# Patient Record
Sex: Female | Born: 1945
Health system: Southern US, Community
[De-identification: ages and names within clinical notes are randomized; demographics above are authoritative.]

## PROBLEM LIST (undated history)

## (undated) DIAGNOSIS — E119 Type 2 diabetes mellitus without complications: Secondary | ICD-10-CM

## (undated) DIAGNOSIS — G4730 Sleep apnea, unspecified: Secondary | ICD-10-CM

## (undated) DIAGNOSIS — F419 Anxiety disorder, unspecified: Secondary | ICD-10-CM

## (undated) DIAGNOSIS — M1990 Unspecified osteoarthritis, unspecified site: Secondary | ICD-10-CM

## (undated) DIAGNOSIS — I10 Essential (primary) hypertension: Secondary | ICD-10-CM

## (undated) HISTORY — DX: Anxiety disorder, unspecified: F41.9

## (undated) HISTORY — PX: JOINT REPLACEMENT: SHX530

## (undated) HISTORY — PX: CHOLECYSTECTOMY: SHX55

## (undated) HISTORY — DX: Type 2 diabetes mellitus without complications: E11.9

## (undated) HISTORY — PX: OTHER SURGICAL HISTORY: SHX169

## (undated) HISTORY — PX: BLADDER SUSPENSION: SHX72

## (undated) HISTORY — DX: Unspecified osteoarthritis, unspecified site: M19.90

---

## 2003-08-23 ENCOUNTER — Other Ambulatory Visit: Payer: Self-pay

## 2003-10-08 ENCOUNTER — Encounter: Payer: Self-pay | Admitting: Unknown Physician Specialty

## 2003-11-08 ENCOUNTER — Encounter: Payer: Self-pay | Admitting: Unknown Physician Specialty

## 2003-12-08 ENCOUNTER — Encounter: Payer: Self-pay | Admitting: Unknown Physician Specialty

## 2004-01-08 ENCOUNTER — Encounter: Payer: Self-pay | Admitting: Unknown Physician Specialty

## 2004-04-10 ENCOUNTER — Ambulatory Visit: Payer: Self-pay | Admitting: Internal Medicine

## 2004-04-18 ENCOUNTER — Ambulatory Visit: Payer: Self-pay | Admitting: Internal Medicine

## 2004-08-27 ENCOUNTER — Ambulatory Visit: Payer: Self-pay | Admitting: Unknown Physician Specialty

## 2004-08-27 ENCOUNTER — Other Ambulatory Visit: Payer: Self-pay

## 2004-08-30 ENCOUNTER — Ambulatory Visit: Payer: Self-pay | Admitting: Unknown Physician Specialty

## 2004-09-06 ENCOUNTER — Ambulatory Visit: Payer: Self-pay | Admitting: Unknown Physician Specialty

## 2005-06-12 ENCOUNTER — Ambulatory Visit: Payer: Self-pay | Admitting: Unknown Physician Specialty

## 2006-03-11 ENCOUNTER — Ambulatory Visit: Payer: Self-pay | Admitting: Otolaryngology

## 2006-07-29 ENCOUNTER — Ambulatory Visit: Payer: Self-pay | Admitting: Unknown Physician Specialty

## 2006-10-02 ENCOUNTER — Ambulatory Visit: Payer: Self-pay | Admitting: Internal Medicine

## 2007-07-03 ENCOUNTER — Ambulatory Visit: Payer: Self-pay | Admitting: Internal Medicine

## 2007-10-09 ENCOUNTER — Emergency Department: Payer: Self-pay | Admitting: Emergency Medicine

## 2008-01-08 HISTORY — PX: ROUX-EN-Y PROCEDURE: SUR1287

## 2009-01-31 ENCOUNTER — Ambulatory Visit: Payer: Self-pay | Admitting: Internal Medicine

## 2009-03-29 LAB — HM PAP SMEAR: HM Pap smear: NORMAL

## 2009-04-26 ENCOUNTER — Ambulatory Visit: Payer: Self-pay | Admitting: Unknown Physician Specialty

## 2009-05-03 ENCOUNTER — Ambulatory Visit: Payer: Self-pay | Admitting: Unknown Physician Specialty

## 2009-05-31 ENCOUNTER — Ambulatory Visit: Payer: Self-pay | Admitting: Internal Medicine

## 2009-06-08 ENCOUNTER — Ambulatory Visit: Payer: Self-pay | Admitting: Internal Medicine

## 2010-03-10 LAB — HM MAMMOGRAPHY

## 2010-08-27 ENCOUNTER — Ambulatory Visit: Payer: Self-pay | Admitting: Unknown Physician Specialty

## 2010-09-05 ENCOUNTER — Other Ambulatory Visit: Payer: Self-pay | Admitting: Internal Medicine

## 2010-09-05 MED ORDER — HYDROCODONE-ACETAMINOPHEN 7.5-750 MG PO TABS: 1.0000 | ORAL_TABLET | Freq: Four times a day (QID) | ORAL | Status: DC | PRN

## 2010-09-17 ENCOUNTER — Other Ambulatory Visit: Payer: Self-pay | Admitting: *Deleted

## 2010-09-17 NOTE — Telephone Encounter (Signed)
Received faxed refill request from pharmacy. Is it okay to refill Omeprazole?

## 2010-09-18 MED ORDER — OMEPRAZOLE 20 MG PO CPDR: 20.0000 mg | DELAYED_RELEASE_CAPSULE | Freq: Every day | ORAL | Status: DC

## 2010-09-21 ENCOUNTER — Other Ambulatory Visit: Payer: Self-pay | Admitting: Internal Medicine

## 2010-09-21 MED ORDER — ALPRAZOLAM 0.5 MG PO TABS: 0.5000 mg | ORAL_TABLET | Freq: Two times a day (BID) | ORAL | Status: DC | PRN

## 2010-09-26 ENCOUNTER — Other Ambulatory Visit: Payer: Self-pay | Admitting: Internal Medicine

## 2010-09-28 MED ORDER — TRAZODONE HCL 100 MG PO TABS: 100.0000 mg | ORAL_TABLET | Freq: Two times a day (BID) | ORAL | Status: DC

## 2010-10-12 ENCOUNTER — Other Ambulatory Visit: Payer: Self-pay | Admitting: Internal Medicine

## 2010-10-12 MED ORDER — IBUPROFEN 800 MG PO TABS: 800.0000 mg | ORAL_TABLET | Freq: Three times a day (TID) | ORAL | Status: AC | PRN

## 2010-10-12 MED ORDER — SERTRALINE HCL 100 MG PO TABS: 100.0000 mg | ORAL_TABLET | Freq: Every day | ORAL | Status: DC

## 2010-10-26 ENCOUNTER — Other Ambulatory Visit: Payer: Self-pay | Admitting: Internal Medicine

## 2010-10-27 MED ORDER — OMEPRAZOLE 20 MG PO CPDR: 20.0000 mg | DELAYED_RELEASE_CAPSULE | Freq: Every day | ORAL | Status: DC

## 2010-10-27 MED ORDER — TRAZODONE HCL 100 MG PO TABS: 100.0000 mg | ORAL_TABLET | Freq: Two times a day (BID) | ORAL | Status: DC

## 2010-11-16 ENCOUNTER — Other Ambulatory Visit (INDEPENDENT_AMBULATORY_CARE_PROVIDER_SITE_OTHER): Payer: Medicare Other | Admitting: *Deleted

## 2010-11-16 ENCOUNTER — Other Ambulatory Visit: Payer: Self-pay | Admitting: *Deleted

## 2010-11-16 DIAGNOSIS — N390 Urinary tract infection, site not specified: Secondary | ICD-10-CM

## 2010-11-16 LAB — POCT URINALYSIS DIPSTICK
Blood, UA: NEGATIVE
Glucose, UA: NEGATIVE
Nitrite, UA: NEGATIVE
pH, UA: 5.5

## 2010-11-16 MED ORDER — SULFAMETHOXAZOLE-TRIMETHOPRIM 800-160 MG PO TABS: 1.0000 | ORAL_TABLET | Freq: Two times a day (BID) | ORAL | Status: AC

## 2010-11-18 LAB — URINE CULTURE

## 2010-12-13 ENCOUNTER — Telehealth: Payer: Self-pay | Admitting: Internal Medicine

## 2010-12-13 MED ORDER — HYDROCODONE-ACETAMINOPHEN 7.5-750 MG PO TABS: 1.0000 | ORAL_TABLET | Freq: Four times a day (QID) | ORAL | Status: DC | PRN

## 2010-12-13 MED ORDER — TRAMADOL HCL 50 MG PO TABS: ORAL_TABLET | ORAL | Status: DC

## 2010-12-13 NOTE — Telephone Encounter (Signed)
Ok to refill the tramadol and vicodin by phone .

## 2010-12-18 NOTE — Telephone Encounter (Signed)
Rxs have been phoned in.

## 2011-01-07 ENCOUNTER — Other Ambulatory Visit: Payer: Self-pay | Admitting: *Deleted

## 2011-01-07 MED ORDER — HYDROCHLOROTHIAZIDE 25 MG PO TABS: 25.0000 mg | ORAL_TABLET | Freq: Every day | ORAL | Status: DC

## 2011-01-07 NOTE — Telephone Encounter (Signed)
Refill request for HCTZ from [facility].  This is not on med list, added for this refill.  No last filled date given.

## 2011-01-10 ENCOUNTER — Other Ambulatory Visit: Payer: Self-pay | Admitting: *Deleted

## 2011-01-10 MED ORDER — SOLIFENACIN SUCCINATE 5 MG PO TABS: 5.0000 mg | ORAL_TABLET | Freq: Every day | ORAL | Status: DC

## 2011-01-10 NOTE — Telephone Encounter (Signed)
Faxed request from [facility], last filled 12/12/10.

## 2011-01-14 ENCOUNTER — Other Ambulatory Visit: Payer: Self-pay | Admitting: *Deleted

## 2011-01-14 MED ORDER — SERTRALINE HCL 100 MG PO TABS: 100.0000 mg | ORAL_TABLET | Freq: Every day | ORAL | Status: DC

## 2011-01-14 NOTE — Telephone Encounter (Signed)
Faxed request from [facility], last filled 12/17/10.

## 2011-01-29 ENCOUNTER — Other Ambulatory Visit: Payer: Self-pay | Admitting: *Deleted

## 2011-01-30 MED ORDER — ALPRAZOLAM 0.5 MG PO TABS: 0.5000 mg | ORAL_TABLET | Freq: Two times a day (BID) | ORAL | Status: DC | PRN

## 2011-01-30 NOTE — Telephone Encounter (Signed)
Rx called to pharmacy

## 2011-01-30 NOTE — Telephone Encounter (Signed)
Refill on printer.  

## 2011-03-07 ENCOUNTER — Other Ambulatory Visit: Payer: Self-pay | Admitting: Internal Medicine

## 2011-03-08 MED ORDER — TRAZODONE HCL 100 MG PO TABS: 100.0000 mg | ORAL_TABLET | Freq: Two times a day (BID) | ORAL | Status: DC

## 2011-03-08 MED ORDER — ALPRAZOLAM 0.5 MG PO TABS: 0.5000 mg | ORAL_TABLET | Freq: Two times a day (BID) | ORAL | Status: DC | PRN

## 2011-03-08 MED ORDER — IBUPROFEN 800 MG PO TABS: 800.0000 mg | ORAL_TABLET | Freq: Three times a day (TID) | ORAL | Status: AC | PRN

## 2011-03-08 MED ORDER — TRAMADOL HCL 50 MG PO TABS: ORAL_TABLET | ORAL | Status: DC

## 2011-03-08 MED ORDER — SERTRALINE HCL 100 MG PO TABS: 100.0000 mg | ORAL_TABLET | Freq: Every day | ORAL | Status: DC

## 2011-03-29 ENCOUNTER — Other Ambulatory Visit: Payer: Self-pay | Admitting: Internal Medicine

## 2011-03-29 MED ORDER — ETODOLAC 500 MG PO TABS: 500.0000 mg | ORAL_TABLET | Freq: Two times a day (BID) | ORAL | Status: DC

## 2011-04-30 ENCOUNTER — Other Ambulatory Visit: Payer: Self-pay | Admitting: Internal Medicine

## 2011-05-01 MED ORDER — TRAZODONE HCL 100 MG PO TABS: 100.0000 mg | ORAL_TABLET | Freq: Two times a day (BID) | ORAL | Status: DC

## 2011-05-29 ENCOUNTER — Other Ambulatory Visit: Payer: Self-pay | Admitting: Internal Medicine

## 2011-05-29 MED ORDER — ALPRAZOLAM 0.5 MG PO TABS: 0.5000 mg | ORAL_TABLET | Freq: Two times a day (BID) | ORAL | Status: DC | PRN

## 2011-06-23 ENCOUNTER — Other Ambulatory Visit: Payer: Self-pay | Admitting: Internal Medicine

## 2011-06-26 ENCOUNTER — Other Ambulatory Visit: Payer: Self-pay | Admitting: Internal Medicine

## 2011-06-26 MED ORDER — SOLIFENACIN SUCCINATE 5 MG PO TABS: 5.0000 mg | ORAL_TABLET | Freq: Every day | ORAL | Status: DC

## 2011-06-26 MED ORDER — SERTRALINE HCL 100 MG PO TABS: 100.0000 mg | ORAL_TABLET | Freq: Every day | ORAL | Status: DC

## 2011-06-26 MED ORDER — TRAZODONE HCL 100 MG PO TABS: 100.0000 mg | ORAL_TABLET | Freq: Two times a day (BID) | ORAL | Status: DC

## 2011-06-26 MED ORDER — HYDROCHLOROTHIAZIDE 25 MG PO TABS: 25.0000 mg | ORAL_TABLET | Freq: Every day | ORAL | Status: DC

## 2011-06-26 NOTE — Telephone Encounter (Signed)
Patient has not been seen here and does not have an upcoming appt.  Please advise.

## 2011-07-01 ENCOUNTER — Other Ambulatory Visit: Payer: Self-pay | Admitting: *Deleted

## 2011-07-02 MED ORDER — HYDROCODONE-ACETAMINOPHEN 7.5-750 MG PO TABS: 1.0000 | ORAL_TABLET | Freq: Four times a day (QID) | ORAL | Status: DC | PRN

## 2011-08-27 ENCOUNTER — Other Ambulatory Visit: Payer: Self-pay | Admitting: Internal Medicine

## 2011-08-31 ENCOUNTER — Other Ambulatory Visit: Payer: Self-pay | Admitting: Internal Medicine

## 2011-09-23 ENCOUNTER — Other Ambulatory Visit: Payer: Self-pay | Admitting: Internal Medicine

## 2011-10-14 ENCOUNTER — Encounter: Payer: Self-pay | Admitting: Internal Medicine

## 2011-10-14 ENCOUNTER — Other Ambulatory Visit: Payer: Self-pay | Admitting: Internal Medicine

## 2011-10-15 ENCOUNTER — Other Ambulatory Visit: Payer: Self-pay

## 2011-10-15 DIAGNOSIS — Z79899 Other long term (current) drug therapy: Secondary | ICD-10-CM

## 2011-10-15 NOTE — Telephone Encounter (Signed)
Refill request for Etodolac 500 mg. Ok to refill?

## 2011-10-15 NOTE — Telephone Encounter (Signed)
No,  Patient has not been seen in over one year, actually has not seen me since before i changed practices. This medicatio ncan affect kidney function and I cannot refill it without knowing her kidney function.,  If she will make an appt for an OV and come in soon for fasting lipids,  hgba1c and CMEt, I can refill once I see her  kidney function I will refill until her visit.

## 2011-10-16 NOTE — Telephone Encounter (Signed)
Left message on patient vm instructing her as diredcted about her refill request. Advised patient to call office and schedule appt or come in for fasting labs.

## 2011-10-21 ENCOUNTER — Other Ambulatory Visit: Payer: Self-pay | Admitting: Internal Medicine

## 2011-10-21 NOTE — Telephone Encounter (Signed)
Pt is calling back [facility] [place].

## 2011-10-21 NOTE — Telephone Encounter (Signed)
Pt is needing refill on Alprazolam. Pt is not sure if the meds are cheaper at [facility] or [facility] she not sure what to go with .

## 2011-10-21 NOTE — Telephone Encounter (Signed)
Patient advised to check with the pharmacies and call back to let us know which pharmacy she would like medication called to.

## 2011-10-23 ENCOUNTER — Other Ambulatory Visit: Payer: Self-pay

## 2011-10-23 ENCOUNTER — Other Ambulatory Visit: Payer: Self-pay | Admitting: Internal Medicine

## 2011-10-23 MED ORDER — ALPRAZOLAM 0.5 MG PO TABS: 0.5000 mg | ORAL_TABLET | Freq: Two times a day (BID) | ORAL | Status: DC | PRN

## 2011-10-23 NOTE — Telephone Encounter (Signed)
Xanax 0.5 mg #60 3 R [facility]

## 2011-10-23 NOTE — Telephone Encounter (Signed)
Refill request for Xanax 0.5 mg. Sent to [facility].Ok to refill?

## 2011-10-24 ENCOUNTER — Other Ambulatory Visit: Payer: Self-pay

## 2011-10-24 MED ORDER — OMEPRAZOLE 20 MG PO CPDR: 20.0000 mg | DELAYED_RELEASE_CAPSULE | Freq: Every day | ORAL | Status: DC

## 2011-10-24 NOTE — Telephone Encounter (Signed)
R'cd fax from [facility] for refill of Omeprazole.  

## 2011-10-28 ENCOUNTER — Other Ambulatory Visit: Payer: Self-pay

## 2011-10-30 ENCOUNTER — Ambulatory Visit (INDEPENDENT_AMBULATORY_CARE_PROVIDER_SITE_OTHER): Payer: Medicare Other | Admitting: Internal Medicine

## 2011-10-30 ENCOUNTER — Encounter: Payer: Self-pay | Admitting: Internal Medicine

## 2011-10-30 VITALS — BP 126/80 | HR 68 | Temp 97.6°F | Wt 166.0 lb

## 2011-10-30 DIAGNOSIS — F1120 Opioid dependence, uncomplicated: Secondary | ICD-10-CM

## 2011-10-30 DIAGNOSIS — E1169 Type 2 diabetes mellitus with other specified complication: Secondary | ICD-10-CM

## 2011-10-30 DIAGNOSIS — Z8639 Personal history of other endocrine, nutritional and metabolic disease: Secondary | ICD-10-CM | POA: Insufficient documentation

## 2011-10-30 DIAGNOSIS — R32 Unspecified urinary incontinence: Secondary | ICD-10-CM

## 2011-10-30 DIAGNOSIS — M129 Arthropathy, unspecified: Secondary | ICD-10-CM

## 2011-10-30 DIAGNOSIS — Z9884 Bariatric surgery status: Secondary | ICD-10-CM | POA: Insufficient documentation

## 2011-10-30 DIAGNOSIS — M1990 Unspecified osteoarthritis, unspecified site: Secondary | ICD-10-CM

## 2011-10-30 DIAGNOSIS — E119 Type 2 diabetes mellitus without complications: Secondary | ICD-10-CM

## 2011-10-30 DIAGNOSIS — Z862 Personal history of diseases of the blood and blood-forming organs and certain disorders involving the immune mechanism: Secondary | ICD-10-CM

## 2011-10-30 DIAGNOSIS — Z79899 Other long term (current) drug therapy: Secondary | ICD-10-CM

## 2011-10-30 DIAGNOSIS — F419 Anxiety disorder, unspecified: Secondary | ICD-10-CM

## 2011-10-30 DIAGNOSIS — E669 Obesity, unspecified: Secondary | ICD-10-CM

## 2011-10-30 DIAGNOSIS — F1920 Other psychoactive substance dependence, uncomplicated: Secondary | ICD-10-CM

## 2011-10-30 DIAGNOSIS — F411 Generalized anxiety disorder: Secondary | ICD-10-CM

## 2011-10-30 LAB — COMPREHENSIVE METABOLIC PANEL
CO2: 29 mEq/L (ref 19–32)
Creatinine, Ser: 0.9 mg/dL (ref 0.4–1.2)
GFR: 65.94 mL/min (ref >60.00)
Glucose, Bld: 86 mg/dL (ref 70–99)
Sodium: 140 mEq/L (ref 135–145)
Total Bilirubin: 0.6 mg/dL (ref 0.3–1.2)
Total Protein: 5.9 g/dL — ABNORMAL LOW (ref 6.0–8.3)

## 2011-10-30 LAB — HEMOGLOBIN A1C: Hgb A1c MFr Bld: 5.2 % (ref 4.6–6.5)

## 2011-10-30 LAB — LIPID PANEL
Cholesterol: 175 mg/dL (ref 0–200)
HDL: 75.4 mg/dL (ref >39.00)
Triglycerides: 68 mg/dL (ref 0.0–149.0)

## 2011-10-30 MED ORDER — OXYBUTYNIN CHLORIDE 5 MG PO TABS: 5.0000 mg | ORAL_TABLET | Freq: Three times a day (TID) | ORAL | Status: DC

## 2011-10-30 MED ORDER — HYDROCODONE-ACETAMINOPHEN 7.5-750 MG PO TABS: 1.0000 | ORAL_TABLET | Freq: Four times a day (QID) | ORAL | Status: DC | PRN

## 2011-10-30 NOTE — Progress Notes (Signed)
Patient ID: Jodi [name], female   DOB: [birth year], 66 y.o.   MRN: [id number] Patient Active Problem List  Diagnosis  . History of gastric bypass  . Personal history of diabetes mellitus  . Diabetes mellitus without complication  . Arthritis  . Anxiety    Subjective:  CC:   Chief Complaint  Patient presents with  . Annual Exam    HPI:   JERRA [name] is a 66 y.o. female who presents as a new patient to establish primary care with the chief complaint of loss to follow up on chronic medical conditions. She has not been seen in over 15 months and her records are not available. She feels generally well except for chronic arthritis involving knees hips and more recently her right thumb. She has obtained and continued to lose weight since her gastric bypass surgery 3 years ago. Her weight has dropped from 365  pounds pre-surgery to 163 pounds currently.,  Bypass gastric was done April 2011 at [facility].  Has not followed with them in over a year bc she lost her Medicaid insurance ver a year ago. Her loss of insurance was not do to increased income was due to simply a failure to complete the application of time. Despite not working she is still a major application. She is surviving on her disability and insurance only. She manages her pain with Vicodin and tramadol.  Her last vicodin dose was this morning.  Using it four times daily and tramadol four times daily.  Sees Dr. Monica Martinez for hip and knee pain,     Past Medical History  Diagnosis Date  . Diabetes mellitus without complication     diet controlled since gastric bypass  . Arthritis   . Anxiety     Past Surgical History  Procedure Date  . Bladder suspension   . Cholecystectomy   . Tracheotomy     at age 89 due to respiratroy failure  . Joint replacement     left hip, right knee    Family History  Problem Relation Age of Onset  . COPD Mother   . Hypertension Mother   . Cancer Mother 71    ovarian CA    History   Social  History  . Marital Status: Widowed    Spouse Name: Jodi [name]    Number of Children: Jodi [name]  . Years of Education: Jodi [name]   Occupational History  . Not on file.   Social History Main Topics  . Smoking status: Never Smoker   . Smokeless tobacco: Not on file  . Alcohol Use: No  . Drug Use: No  . Sexually Active: Not on file   Other Topics Concern  . Not on file   Social History Narrative  . No narrative on file     Allergies  Allergen Reactions  . Morphine And Related Nausea And Vomiting    Review of Systems:   The remainder of the review of systems was negative except those addressed in the HPI.    Objective:  BP 126/80  Pulse 68  Temp 97.6 F (36.4 C)  Wt 166 lb (75.297 kg)  SpO2 96%  General appearance: alert, cooperative and appears stated age Ears: normal TM's and external ear canals both ears Throat: lips, mucosa, and tongue normal; teeth and gums normal Neck: no adenopathy, no carotid bruit, supple, symmetrical, trachea midline and thyroid not enlarged, symmetric, no tenderness/mass/nodules Back: symmetric, no curvature. ROM normal. No CVA tenderness. Lungs: clear to auscultation bilaterally Heart:  regular rate and rhythm, S1, S2 normal, no murmur, click, rub or gallop Abdomen: soft, non-tender; bowel sounds normal; no masses,  no organomegaly Pulses: 2+ and symmetric Skin: Skin color, texture, turgor normal. No rashes or lesions Lymph nodes: Cervical, supraclavicular, and axillary nodes normal.  Assessment and Plan:  Personal history of diabetes mellitus Resolved with dramatic wt loss achieved with gastric bypass.  Overdue for eye exam by [facility].   History of gastric bypass She has not had followup with her bariatric surgery in over a year due to loss of insurance. He will be tested today for vitamin D deficiency B12 deficiency andother a left jaw abnormalities.  Diabetes mellitus without complication Her diabetes has been diet controlled since her gastric  bypass 3 years ago. She has not had a hemoglobin A1c in over a year. She is also overdue for her annual eye exam.  Arthritis Chronic with multiple joints involved due to OA. Given her chronic use of Vicodin and lost to followup I required that she be seen today. She will be screened for diversion with the urine drug screen.  Anxiety Managed with sertraline and alprazolam. No changes today.   Updated Medication List Outpatient Encounter Prescriptions as of 10/30/2011  Medication Sig Dispense Refill  . hydrochlorothiazide (HYDRODIURIL) 25 MG tablet Take 1 tablet (25 mg total) by mouth daily.  90 tablet  3  . HYDROcodone-acetaminophen (VICODIN ES) 7.5-750 MG per tablet Take 1 tablet by mouth 4 (four) times daily as needed for pain.  120 tablet  0  . omeprazole (PRILOSEC) 20 MG capsule Take 1 capsule (20 mg total) by mouth daily.  30 capsule  6  . sertraline (ZOLOFT) 100 MG tablet Take 1 tablet (100 mg total) by mouth daily.  90 tablet  3  . solifenacin (VESICARE) 5 MG tablet Take 1 tablet (5 mg total) by mouth daily.  90 tablet  3  . traMADol (ULTRAM) 50 MG tablet TAKE 1 TABLET UP TO FOUR TIMES DAILY AS NEEDED  120 tablet  3  . DISCONTD: HYDROcodone-acetaminophen (VICODIN ES) 7.5-750 MG per tablet Take 1 tablet by mouth 4 (four) times daily as needed for pain.  120 tablet  2  . ALPRAZolam (XANAX) 0.5 MG tablet TAKE 1 TABLET BY MOUTH TWICE A DAY AS NEEDED  60 tablet  3  . etodolac (LODINE) 500 MG tablet TAKE 1 TABLET (500 MG TOTAL) BY MOUTH 2 (TWO) TIMES DAILY.  60 tablet  2  . oxybutynin (DITROPAN) 5 MG tablet Take 1 tablet (5 mg total) by mouth 3 (three) times daily.  60 tablet  3  . traMADol (ULTRAM) 50 MG tablet Take one tablet up to four times daily as needed.  120 tablet  3  . traZODone (DESYREL) 100 MG tablet Take 1 tablet (100 mg total) by mouth 2 (two) times daily.  60 tablet  3  . traZODone (DESYREL) 100 MG tablet Take 1 tablet (100 mg total) by mouth 2 (two) times daily.  60 tablet  3    . traZODone (DESYREL) 100 MG tablet Take 1 tablet (100 mg total) by mouth 2 (two) times daily.  180 tablet  3     Orders Placed This Encounter  Procedures  . HM MAMMOGRAPHY  . HM PAP SMEAR  . Hemoglobin A1c  . Lipid panel  . Microalbumin / creatinine urine ratio  . Comprehensive metabolic panel  . Vitamin D 25 hydroxy  . Vitamin B12  . Drug Screen, Urine    No  Follow-up on file.

## 2011-10-30 NOTE — Assessment & Plan Note (Signed)
Resolved with dramatic wt loss achieved with gastric bypass.  Overdue for eye exam by [facility].

## 2011-10-30 NOTE — Patient Instructions (Addendum)
Please return for your annual medicare wellness exam at your convenience

## 2011-10-31 LAB — DRUG SCREEN, URINE
Creatinine,U: 162.33 mg/dL
Opiates: POSITIVE — AB
Phencyclidine (PCP): NEGATIVE
Propoxyphene: NEGATIVE

## 2011-10-31 LAB — VITAMIN D 25 HYDROXY (VIT D DEFICIENCY, FRACTURES): Vit D, 25-Hydroxy: 31 ng/mL (ref 30–89)

## 2011-11-01 ENCOUNTER — Encounter: Payer: Self-pay | Admitting: Internal Medicine

## 2011-11-01 DIAGNOSIS — M1990 Unspecified osteoarthritis, unspecified site: Secondary | ICD-10-CM | POA: Insufficient documentation

## 2011-11-01 DIAGNOSIS — F419 Anxiety disorder, unspecified: Secondary | ICD-10-CM | POA: Insufficient documentation

## 2011-11-01 DIAGNOSIS — E119 Type 2 diabetes mellitus without complications: Secondary | ICD-10-CM | POA: Insufficient documentation

## 2011-11-01 NOTE — Assessment & Plan Note (Signed)
Chronic with multiple joints involved due to OA. Given her chronic use of Vicodin and lost to followup I required that she be seen today. She will be screened for diversion with the urine drug screen.

## 2011-11-01 NOTE — Assessment & Plan Note (Signed)
Her diabetes has been diet controlled since her gastric bypass 3 years ago. She has not had a hemoglobin A1c in over a year. She is also overdue for her annual eye exam.   

## 2011-11-01 NOTE — Assessment & Plan Note (Signed)
She has not had followup with her bariatric surgery in over a year due to loss of insurance. He will be tested today for vitamin D deficiency B12 deficiency andother a left jaw abnormalities.

## 2011-11-01 NOTE — Assessment & Plan Note (Signed)
Managed with sertraline and alprazolam.  No changes today 

## 2011-12-08 ENCOUNTER — Other Ambulatory Visit: Payer: Self-pay | Admitting: Internal Medicine

## 2011-12-12 ENCOUNTER — Telehealth: Payer: Self-pay | Admitting: Internal Medicine

## 2011-12-12 NOTE — Telephone Encounter (Signed)
Patient Information:  Caller Name: Jackalyn  Phone: [phone]  Patient: Jodi [name], Jodi [name]  Gender: Female  DOB: 09/23/1945  Age: 66 Years  PCP: Duncan Dull (Adults only)   Symptoms  Reason For Call & Symptoms: Is having increased headaches, and swollen hands  Reviewed Health History In EMR: Yes  Reviewed Medications In EMR: Yes  Reviewed Allergies In EMR: Yes  Reviewed Surgeries / Procedures: Yes  Date of Onset of Symptoms: 11/28/2011  Guideline(s) Used:  Headache  Disposition Per Guideline:   Home Care  Reason For Disposition Reached:   Similar to previously diagnosed migraine headaches  Advice Given:  Call Back If:  You become worse.  Office Follow Up:  Does the office need to follow up with this patient?: Yes  Instructions For The Office: Please follow up regarding question about taking medication 3 times a day as ordered but only had 60 dispensed. Also about if the increase in swelling and headaches could be related to the medications.  RN Note:  Is having oxybutynin is not working but seems to have an increase in headaches, puffiness, swollen;  Rates headache when then have as like having 1000s crickets in her head--noise that aggravates. It is a physical discomfort as well as a sound discomfort.  Has had ringing in ears for years.  Patient states is part of a chronic problem that seem to have been made worse related to the oxybutynin. Patient has a hx of migraines but uses distraction to work through headaches.  Patient wants clarification as to whether or not she should be taking Oxybutynin 3 times a day since she was only given enough for 2 times a day (60 versus 90)?  Also wants to know if the medication could be responsible for her symptoms that did not intensify until she did? Patient will call for an appointment to be seen sooner than scheduled physical in March related to headache.

## 2011-12-13 NOTE — Telephone Encounter (Signed)
Spoke to patient gave her instructions as directed, and advised patient to make appt for further questions.

## 2011-12-13 NOTE — Telephone Encounter (Signed)
Her  oxybutynin should be twice daily .   If she think it is aggravating her headaches she should stop it  For a week. For the resot of her questions,  Needs appt

## 2011-12-16 ENCOUNTER — Ambulatory Visit (INDEPENDENT_AMBULATORY_CARE_PROVIDER_SITE_OTHER)
Admission: RE | Admit: 2011-12-16 | Discharge: 2011-12-16 | Disposition: A | Discharge status: Home / Self Care | Payer: Medicare Other | Source: Ambulatory Visit | Attending: Internal Medicine | Admitting: Internal Medicine

## 2011-12-16 ENCOUNTER — Encounter: Payer: Self-pay | Admitting: Internal Medicine

## 2011-12-16 ENCOUNTER — Ambulatory Visit (INDEPENDENT_AMBULATORY_CARE_PROVIDER_SITE_OTHER): Payer: Medicare Other | Admitting: Internal Medicine

## 2011-12-16 VITALS — BP 138/70 | HR 54 | Temp 97.6°F | Resp 12 | Ht 65.0 in | Wt 168.5 lb

## 2011-12-16 DIAGNOSIS — R51 Headache: Secondary | ICD-10-CM

## 2011-12-16 DIAGNOSIS — Z862 Personal history of diseases of the blood and blood-forming organs and certain disorders involving the immune mechanism: Secondary | ICD-10-CM

## 2011-12-16 DIAGNOSIS — E119 Type 2 diabetes mellitus without complications: Secondary | ICD-10-CM

## 2011-12-16 DIAGNOSIS — Z1331 Encounter for screening for depression: Secondary | ICD-10-CM

## 2011-12-16 DIAGNOSIS — F1920 Other psychoactive substance dependence, uncomplicated: Secondary | ICD-10-CM

## 2011-12-16 DIAGNOSIS — R519 Headache, unspecified: Secondary | ICD-10-CM

## 2011-12-16 DIAGNOSIS — Z8639 Personal history of other endocrine, nutritional and metabolic disease: Secondary | ICD-10-CM

## 2011-12-16 DIAGNOSIS — F1120 Opioid dependence, uncomplicated: Secondary | ICD-10-CM

## 2011-12-16 LAB — CBC WITH DIFFERENTIAL/PLATELET
Basophils Relative: 0.6 % (ref 0.0–3.0)
Eosinophils Absolute: 0 10*3/uL (ref 0.0–0.7)
Eosinophils Relative: 0.5 % (ref 0.0–5.0)
HCT: 35.8 % — ABNORMAL LOW (ref 36.0–46.0)
Lymphocytes Relative: 27.6 % (ref 12.0–46.0)
MCV: 92.4 fl (ref 78.0–100.0)
Monocytes Relative: 6.3 % (ref 3.0–12.0)
Neutro Abs: 3.2 10*3/uL (ref 1.4–7.7)
Neutrophils Relative %: 65 % (ref 43.0–77.0)

## 2011-12-16 LAB — MICROALBUMIN / CREATININE URINE RATIO: Microalb, Ur: 0.2 mg/dL (ref 0.0–1.9)

## 2011-12-16 LAB — SEDIMENTATION RATE: Sed Rate: 5 mm/hr (ref 0–22)

## 2011-12-16 LAB — C-REACTIVE PROTEIN: CRP: <0.5 mg/dL (ref 0.5–20.0)

## 2011-12-16 NOTE — Progress Notes (Signed)
Patient ID: Jodi [name], female   DOB: [birth year], 66 y.o.   MRN: [id number]  Patient Active Problem List  Diagnosis  . History of gastric bypass  . Personal history of diabetes mellitus  . Diabetes mellitus without complication  . Arthritis  . Anxiety  . New onset of headaches after age 40    Subjective:  CC:   Chief Complaint  Patient presents with  . Headache    HPI:   Jodi [name] a 66 y.o. female who presents fof evaluation of new onset headaches.  She has a history of chronic tinnitus  for years and years,  Which is aggravated by headaches. The headaches are occurring daily in the morning and increased in frequency when she started taking  Oxybutynin for urinary incontinence.  They are rRelieved only with medication (tylenol)  . Takes tramadol and etodolac regularly for arthritis pain and .  vicodin twice daily for pain not relieved with non narcotics.  The headaches vary and omeitmes feels like a pin stabbing in the eye, sometimes feel pulsatile ("my veins are swelling in my head) . Feels tender in the temples but has  Not noted any engorged blood veins in her temples or forehead, only on the scalp.  She occasionally has had blurring of vision with resolution when headaches resolve.  Some dizziness with sudden position change.  No eye exam in the last year.  She has OA of the neck which causes chronic  neck pain and hears/feels grinding when she turns her head from side to side.   Past Medical History  Diagnosis Date  . Diabetes mellitus without complication     diet controlled since gastric bypass  . Arthritis   . Anxiety     Past Surgical History  Procedure Date  . Bladder suspension   . Cholecystectomy   . Tracheotomy     at age 54 due to respiratroy failure  . Joint replacement     left hip, right knee  . Roux-en-y procedure 2010    The following portions of the patient's history were reviewed and updated as appropriate: Allergies, current medications, and  problem list.    Review of Systems:   Patient denies, fevers, malaise, unintentional weight loss, skin rash, eye pain, sinus congestion and sinus pain, sore throat, dysphagia,  hemoptysis , cough, dyspnea, wheezing, chest pain, palpitations, orthopnea, edema, abdominal pain, nausea, melena, diarrhea, constipation, flank pain, dysuria, hematuria, urinary  Frequency, nocturia, numbness, tingling, seizures,  Focal weakness, Loss of consciousness,  Tremor, insomnia, depression, anxiety, and suicidal ideation.       History   Social History  . Marital Status: Widowed    Spouse Name: N/A    Number of Children: N/A  . Years of Education: N/A   Occupational History  . Not on file.   Social History Main Topics  . Smoking status: Never Smoker   . Smokeless tobacco: Not on file  . Alcohol Use: No  . Drug Use: No  . Sexually Active: Not on file   Other Topics Concern  . Not on file   Social History Narrative  . No narrative on file    Objective:  BP 138/70  Pulse 54  Temp 97.6 F (36.4 C) (Oral)  Resp 12  Ht 5\' 5"  (1.651 m)  Wt 168 lb 8 oz (76.431 kg)  BMI 28.04 kg/m2  SpO2 98%  General appearance: alert, cooperative and appears stated age Ears: normal TM's and external ear canals both ears  Throat: lips, mucosa, and tongue normal; teeth and gums normal Neck: no adenopathy, no carotid bruit, supple, symmetrical, trachea midline and thyroid not enlarged, symmetric, no tenderness/mass/nodules Back: symmetric, no curvature. ROM normal. No CVA tenderness. Lungs: clear to auscultation bilaterally Heart: regular rate and rhythm, S1, S2 normal, no murmur, click, rub or gallop Abdomen: soft, non-tender; bowel sounds normal; no masses,  no organomegaly Pulses: 2+ and symmetric Skin: Skin color, texture, turgor normal. No rashes or lesions Lymph nodes: Cervical, supraclavicular, and axillary nodes normal. Neuro: grossly nonfocal.  No CN deficit, DTRs normal, no nystagmys, negative  pronator drift  Assessment and Plan:  New onset of headaches after age 86 Now daily for several weeks,  With OA neck likely contributing. Consider vasculitis , cervical myelopathy and medication rebound,  Stop oxybutynin for a trial,  Plain films of neck, UDS for chronci use of vicodin to rule out diversion.  Will consider trial of elavil  Personal history of diabetes mellitus Reminded to have her annual diabetic eye exam even though she has resolved historyr of diabetes with weight loss   Updated Medication List Outpatient Encounter Prescriptions as of 12/16/2011  Medication Sig Dispense Refill  . ALPRAZolam (XANAX) 0.5 MG tablet TAKE 1 TABLET BY MOUTH TWICE A DAY AS NEEDED  60 tablet  3  . etodolac (LODINE) 500 MG tablet TAKE 1 TABLET (500 MG TOTAL) BY MOUTH 2 (TWO) TIMES DAILY.  60 tablet  11  . hydrochlorothiazide (HYDRODIURIL) 25 MG tablet Take 1 tablet (25 mg total) by mouth daily.  90 tablet  3  . HYDROcodone-acetaminophen (VICODIN ES) 7.5-750 MG per tablet Take 1 tablet by mouth 4 (four) times daily as needed for pain.  120 tablet  0  . omeprazole (PRILOSEC) 20 MG capsule Take 1 capsule (20 mg total) by mouth daily.  30 capsule  6  . oxybutynin (DITROPAN) 5 MG tablet Take 1 tablet (5 mg total) by mouth 3 (three) times daily.  60 tablet  3  . sertraline (ZOLOFT) 100 MG tablet Take 1 tablet (100 mg total) by mouth daily.  90 tablet  3  . traMADol (ULTRAM) 50 MG tablet Take one tablet up to four times daily as needed.  120 tablet  3  . traMADol (ULTRAM) 50 MG tablet TAKE 1 TABLET UP TO FOUR TIMES DAILY AS NEEDED  120 tablet  3  . traZODone (DESYREL) 100 MG tablet Take 1 tablet (100 mg total) by mouth 2 (two) times daily.  60 tablet  3  . solifenacin (VESICARE) 5 MG tablet Take 1 tablet (5 mg total) by mouth daily.  90 tablet  3  . traZODone (DESYREL) 100 MG tablet Take 1 tablet (100 mg total) by mouth 2 (two) times daily.  60 tablet  3  . traZODone (DESYREL) 100 MG tablet Take 1 tablet  (100 mg total) by mouth 2 (two) times daily.  180 tablet  3     Orders Placed This Encounter  Procedures  . DG Cervical Spine Complete  . Drug Screen, Urine  . Microalbumin / creatinine urine ratio  . Sedimentation rate  . CBC with Differential  . C-reactive protein    No Follow-up on file.

## 2011-12-16 NOTE — Patient Instructions (Addendum)
Stop the oxybutynin  For two weeks  To see if the headaches improve.  Plain x rays  Of your neck to be done at  The [place] office on [address] at [address] .  It is on the left side as you drive towards [place],  [place].   Labs today

## 2011-12-17 ENCOUNTER — Encounter: Payer: Self-pay | Admitting: Internal Medicine

## 2011-12-17 DIAGNOSIS — R519 Headache, unspecified: Secondary | ICD-10-CM | POA: Insufficient documentation

## 2011-12-17 LAB — DRUG SCREEN, URINE
Barbiturate Quant, Ur: NEGATIVE
Cocaine Metabolites: NEGATIVE
Creatinine,U: 151.76 mg/dL
Methadone: NEGATIVE
Propoxyphene: NEGATIVE

## 2011-12-17 NOTE — Assessment & Plan Note (Signed)
Reminded to have her annual diabetic eye exam even though she has resolved historyr of diabetes with weight loss

## 2011-12-17 NOTE — Assessment & Plan Note (Signed)
Now daily for several weeks,  With OA neck likely contributing. Consider vasculitis , cervical myelopathy and medication rebound,  Stop oxybutynin for a trial,  Plain films of neck, UDS for chronci use of vicodin to rule out diversion.  Will consider trial of elavil

## 2011-12-28 ENCOUNTER — Other Ambulatory Visit: Payer: Self-pay | Admitting: Internal Medicine

## 2012-01-07 ENCOUNTER — Telehealth: Payer: Self-pay | Admitting: Internal Medicine

## 2012-01-07 MED ORDER — HYDROCODONE-ACETAMINOPHEN 10-325 MG PO TABS: 1.0000 | ORAL_TABLET | Freq: Three times a day (TID) | ORAL | Status: DC | PRN

## 2012-01-07 MED ORDER — TRAMADOL HCL 50 MG PO TABS: 100.0000 mg | ORAL_TABLET | Freq: Three times a day (TID) | ORAL | Status: DC | PRN

## 2012-01-07 MED ORDER — AMITRIPTYLINE HCL 25 MG PO TABS: 25.0000 mg | ORAL_TABLET | Freq: Every day | ORAL | Status: DC

## 2012-01-07 NOTE — Telephone Encounter (Signed)
Patient left voicemail on 01/06/12 at 2:43 stating they no longer were carrying her hydrocodone 7.5/750 she states that she has tried the 7.35 however it didn't touch her pain. She needs something called into pharmacy that will equal to the hydrocodone 7.5/750.

## 2012-01-07 NOTE — Telephone Encounter (Signed)
I called and spoke with patient she states that the hydrocodone is higher in price now and that she doesn't get as many pills.  She states that she was taking the hydrocodone for knees and hips, she states that she isn't having as many headaches now, but she will still take the medication as needed.  She wants to know if tramadol comes in higher dose than 50 mg since she takes that 4 times a day.  She states that isn't strong enough to take away the pain.  She is about to run out of hydrocodone.

## 2012-01-07 NOTE — Telephone Encounter (Signed)
I can change the vicodin to 10/325 , and new rx can be called in for #120/month. If her pain is that severe that 4 vicodin per day and 4 tramadol per day is not managing her pain she can increase her tramadol to 6 daily but  I will not prescribe anything stronger .  .after that she will need to see her orthopedist  for other options.     Please also remind her that we diagnosed b12 deficiency at last visit and she has not been in for any b12 injections as far as I can tell.  B12 deficiency results from gastric bypass surgery and can cause a permanent neuropathy so she needs to take this seriously

## 2012-01-07 NOTE — Telephone Encounter (Signed)
See answer to note below that was misrouted. Please convey to patient

## 2012-01-07 NOTE — Telephone Encounter (Signed)
There is nothing stronger tha I will call in.  The only component that has changed  In the the hydrocodone is the amount of tylenol in it, not the narcotic.  She will have to make do with what she has until she can be seen.  If her headaches are still bothering her she will need to start elavil at bedtime   I will call that rx in.

## 2012-01-14 NOTE — Telephone Encounter (Signed)
Pt.notified

## 2012-01-28 ENCOUNTER — Encounter: Payer: Self-pay | Admitting: Adult Health

## 2012-01-28 ENCOUNTER — Ambulatory Visit (INDEPENDENT_AMBULATORY_CARE_PROVIDER_SITE_OTHER): Payer: Medicare Other | Admitting: Adult Health

## 2012-01-28 VITALS — BP 122/66 | HR 70 | Temp 97.4°F | Resp 18 | Ht 66.0 in | Wt 162.0 lb

## 2012-01-28 DIAGNOSIS — E538 Deficiency of other specified B group vitamins: Secondary | ICD-10-CM

## 2012-01-28 DIAGNOSIS — R059 Cough, unspecified: Secondary | ICD-10-CM

## 2012-01-28 DIAGNOSIS — R05 Cough: Secondary | ICD-10-CM | POA: Insufficient documentation

## 2012-01-28 DIAGNOSIS — R058 Other specified cough: Secondary | ICD-10-CM | POA: Insufficient documentation

## 2012-01-28 MED ORDER — AMOXICILLIN-POT CLAVULANATE 875-125 MG PO TABS: 1.0000 | ORAL_TABLET | Freq: Two times a day (BID) | ORAL | Status: DC

## 2012-01-28 MED ORDER — CYANOCOBALAMIN 1000 MCG/ML IJ SOLN
1000.0000 ug | Freq: Once | INTRAMUSCULAR | Status: AC
  Administered 2012-01-28: 1000 ug via INTRAMUSCULAR

## 2012-01-28 NOTE — Progress Notes (Signed)
Subjective:    Patient ID: Jodi [name], female    DOB: 05-18-1945, 67 y.o.   MRN: [id number]  HPI  Patient presents to clinic with 2-3 week hx of productive cough of green phlegm. Denies fever although she has had some chills. She has tried some OTC coricidin which she felt had improved her symptoms slightly but they did not completely resolve.   Current Outpatient Prescriptions on File Prior to Visit  Medication Sig Dispense Refill  . ALPRAZolam (XANAX) 0.5 MG tablet TAKE 1 TABLET BY MOUTH TWICE A DAY AS NEEDED  60 tablet  3  . amitriptyline (ELAVIL) 25 MG tablet Take 1 tablet (25 mg total) by mouth at bedtime. May increase to 2 if needed after one week  60 tablet  2  . etodolac (LODINE) 500 MG tablet TAKE 1 TABLET (500 MG TOTAL) BY MOUTH 2 (TWO) TIMES DAILY.  60 tablet  11  . hydrochlorothiazide (HYDRODIURIL) 25 MG tablet Take 1 tablet (25 mg total) by mouth daily.  90 tablet  3  . HYDROcodone-acetaminophen (NORCO) 10-325 MG per tablet Take 1 tablet by mouth every 8 (eight) hours as needed for pain.  120 tablet  3  . omeprazole (PRILOSEC) 20 MG capsule Take 1 capsule (20 mg total) by mouth daily.  30 capsule  6  . sertraline (ZOLOFT) 100 MG tablet Take 1 tablet (100 mg total) by mouth daily.  90 tablet  3  . traMADol (ULTRAM) 50 MG tablet Take 2 tablets (100 mg total) by mouth every 8 (eight) hours as needed for pain.  180 tablet  3  . oxybutynin (DITROPAN) 5 MG tablet Take 1 tablet (5 mg total) by mouth 3 (three) times daily.  60 tablet  3  . solifenacin (VESICARE) 5 MG tablet Take 1 tablet (5 mg total) by mouth daily.  90 tablet  3  . traMADol (ULTRAM) 50 MG tablet Take one tablet up to four times daily as needed.  120 tablet  3  . traZODone (DESYREL) 100 MG tablet Take 1 tablet (100 mg total) by mouth 2 (two) times daily.  60 tablet  3  . traZODone (DESYREL) 100 MG tablet Take 1 tablet (100 mg total) by mouth 2 (two) times daily.  60 tablet  3  . traZODone (DESYREL) 100 MG tablet  Take 1 tablet (100 mg total) by mouth 2 (two) times daily.  180 tablet  3     Review of Systems  Constitutional: Positive for chills, activity change and fatigue. Negative for fever.  HENT: Positive for postnasal drip. Negative for congestion and sore throat.   Eyes: Negative.   Respiratory: Positive for cough. Negative for wheezing.   Cardiovascular: Negative.  Negative for chest pain.  Gastrointestinal: Negative.   Genitourinary: Negative.   Skin: Negative.   Neurological: Positive for weakness.    BP 122/66  Pulse 70  Temp 97.4 F (36.3 C) (Oral)  Resp 18  Ht 5\' 6"  (1.676 m)  Wt 162 lb (73.483 kg)  BMI 26.15 kg/m2  SpO2 96%     Objective:   Physical Exam  Constitutional: She is oriented to person, place, and time. She appears well-developed and well-nourished. No distress.  HENT:  Head: Normocephalic and atraumatic.  Cardiovascular: Normal rate.  Exam reveals no gallop.   No murmur heard.      Irregular rhythm  Pulmonary/Chest: Effort normal and breath sounds normal. She has no wheezes. She has no rales.       Good air  movement throughout all lung fields.  Abdominal: Soft. Bowel sounds are normal.  Lymphadenopathy:    She has no cervical adenopathy.  Neurological: She is alert and oriented to person, place, and time.  Skin: Skin is warm and dry.  Psychiatric: She has a normal mood and affect. Her behavior is normal. Thought content normal.        Assessment & Plan:

## 2012-01-28 NOTE — Assessment & Plan Note (Addendum)
Symptoms ongoing > 2 weeks. Patient did not seek medical attention sooner because she felt she was improving. Her lungs are clear to auscultation with good air movement. Will start Augmentin 2/2 report of green sputum. Patient will continue taking OTC cough medicine. She is to RTC if symptoms do not improve within 3-4 days.

## 2012-01-28 NOTE — Patient Instructions (Addendum)
  Start the antibiotic today. You will take this twice daily for 7 days.  Take a cough medicine that has DM in the name.  Please drink fluids to stay hydrated.  Your symptoms should improve within 3-4 days. If not, please call.

## 2012-01-30 ENCOUNTER — Ambulatory Visit (INDEPENDENT_AMBULATORY_CARE_PROVIDER_SITE_OTHER): Payer: Medicare Other | Admitting: *Deleted

## 2012-01-30 DIAGNOSIS — E538 Deficiency of other specified B group vitamins: Secondary | ICD-10-CM

## 2012-01-30 MED ORDER — CYANOCOBALAMIN 1000 MCG/ML IJ SOLN
1000.0000 ug | Freq: Once | INTRAMUSCULAR | Status: AC
  Administered 2012-01-30: 1000 ug via INTRAMUSCULAR

## 2012-01-31 ENCOUNTER — Ambulatory Visit (INDEPENDENT_AMBULATORY_CARE_PROVIDER_SITE_OTHER): Payer: Medicare Other | Admitting: *Deleted

## 2012-01-31 DIAGNOSIS — E538 Deficiency of other specified B group vitamins: Secondary | ICD-10-CM

## 2012-01-31 MED ORDER — CYANOCOBALAMIN 1000 MCG/ML IJ SOLN
1000.0000 ug | Freq: Once | INTRAMUSCULAR | Status: AC
  Administered 2012-01-31: 1000 ug via INTRAMUSCULAR

## 2012-02-16 ENCOUNTER — Other Ambulatory Visit: Payer: Self-pay | Admitting: Internal Medicine

## 2012-02-24 ENCOUNTER — Other Ambulatory Visit: Payer: Self-pay | Admitting: Internal Medicine

## 2012-02-24 NOTE — Telephone Encounter (Signed)
Med filled already

## 2012-03-03 ENCOUNTER — Ambulatory Visit (INDEPENDENT_AMBULATORY_CARE_PROVIDER_SITE_OTHER): Payer: Medicare Other | Admitting: *Deleted

## 2012-03-03 ENCOUNTER — Ambulatory Visit: Payer: Medicare Other

## 2012-03-03 DIAGNOSIS — E538 Deficiency of other specified B group vitamins: Secondary | ICD-10-CM

## 2012-03-03 MED ORDER — CYANOCOBALAMIN 1000 MCG/ML IJ SOLN
1000.0000 ug | Freq: Once | INTRAMUSCULAR | Status: AC
  Administered 2012-03-03: 1000 ug via INTRAMUSCULAR

## 2012-03-09 ENCOUNTER — Ambulatory Visit (INDEPENDENT_AMBULATORY_CARE_PROVIDER_SITE_OTHER): Payer: Medicare Other | Admitting: Internal Medicine

## 2012-03-09 ENCOUNTER — Other Ambulatory Visit (HOSPITAL_COMMUNITY)
Admission: RE | Admit: 2012-03-09 | Discharge: 2012-03-09 | Disposition: A | Discharge status: Home / Self Care | Payer: Medicare Other | Source: Ambulatory Visit | Attending: Internal Medicine | Admitting: Internal Medicine

## 2012-03-09 ENCOUNTER — Encounter: Payer: Self-pay | Admitting: Internal Medicine

## 2012-03-09 VITALS — BP 128/78 | HR 76 | Temp 97.8°F | Resp 12 | Ht 65.25 in | Wt 164.0 lb

## 2012-03-09 DIAGNOSIS — Z0000 Encounter for general adult medical examination without abnormal findings: Secondary | ICD-10-CM | POA: Insufficient documentation

## 2012-03-09 DIAGNOSIS — Z1239 Encounter for other screening for malignant neoplasm of breast: Secondary | ICD-10-CM

## 2012-03-09 DIAGNOSIS — Z79899 Other long term (current) drug therapy: Secondary | ICD-10-CM

## 2012-03-09 DIAGNOSIS — Z1151 Encounter for screening for human papillomavirus (HPV): Secondary | ICD-10-CM | POA: Insufficient documentation

## 2012-03-09 DIAGNOSIS — Z1211 Encounter for screening for malignant neoplasm of colon: Secondary | ICD-10-CM

## 2012-03-09 DIAGNOSIS — E119 Type 2 diabetes mellitus without complications: Secondary | ICD-10-CM

## 2012-03-09 DIAGNOSIS — Z124 Encounter for screening for malignant neoplasm of cervix: Secondary | ICD-10-CM

## 2012-03-09 LAB — COMPREHENSIVE METABOLIC PANEL
ALT: 16 U/L (ref 0–35)
AST: 17 U/L (ref 0–37)
CO2: 29 mEq/L (ref 19–32)
Calcium: 8.9 mg/dL (ref 8.4–10.5)
Chloride: 103 mEq/L (ref 96–112)
Creatinine, Ser: 0.8 mg/dL (ref 0.4–1.2)
GFR: 74.28 mL/min (ref >60.00)
Sodium: 139 mEq/L (ref 135–145)
Total Bilirubin: 0.9 mg/dL (ref 0.3–1.2)
Total Protein: 6 g/dL (ref 6.0–8.3)

## 2012-03-09 LAB — LIPID PANEL
HDL: 72.1 mg/dL (ref >39.00)
Total CHOL/HDL Ratio: 2
VLDL: 13.2 mg/dL (ref 0.0–40.0)

## 2012-03-09 LAB — HEMOGLOBIN A1C: Hgb A1c MFr Bld: 5.2 % (ref 4.6–6.5)

## 2012-03-09 NOTE — Patient Instructions (Addendum)
We will notify  You the results of your labs drawn today, inclduign your EKG.  You are continuining to lose weight!  Your mammogram will be scheduled.

## 2012-03-09 NOTE — Progress Notes (Signed)
Patient ID: Jodi [name], female   DOB: 1945-04-03, 67 y.o.   MRN: [id number]  The patient is here for her first  Medicare wellness examination   The risk factors are reflected in the social history.  The roster of all physicians providing medical care to patient - is listed in the Snapshot section of the chart.  Activities of daily living:  The patient is 100% independent in all ADLs: dressing, toileting, feeding as well as independent mobility  Home safety : The patient has smoke detectors in the home. They wear seatbelts.  There are no firearms at home. There is no violence in the home.   There is no risks for hepatitis, STDs or HIV. There is no   history of blood transfusion. They have no travel history to infectious disease endemic areas of the world.  The patient has seen their dentist in the last six month. They have seen their eye doctor in the last year. They admit to slight hearing difficulty with regard to whispered voices and some television programs.  They have deferred audiologic testing in the last year.  They do not  have excessive sun exposure. Discussed the need for sun protection: hats, long sleeves and use of sunscreen if there is significant sun exposure.   Diet: the importance of a healthy diet is discussed. They do have a healthy diet.   The benefits of regular aerobic exercise were discussed. However due to bilateral hip and knee pain she does not exercise.    Depression screen: there are no signs or vegative symptoms of depression- irritability, change in appetite, anhedonia, sadness/tearfullness.  Cognitive assessment: the patient manages all their financial and personal affairs and is actively engaged. They could relate day,date,year and events; recalled 2/3 objects at 3 minutes; performed clock-face test normally.  The following portions of the patient's history were reviewed and updated as appropriate: allergies, current medications, past family history, past  medical history,  past surgical history, past social history  and problem list.  Visual acuity was not assessed per patient preference since she has regular follow up with her ophthalmologist. Hearing and body mass index were assessed and reviewed.   During the course of the visit the patient was educated and counseled about appropriate screening and preventive services including : fall prevention , diabetes screening, nutrition counseling, colorectal cancer screening, and recommended immunizations.    Objective:  BP 128/78  Pulse 76  Temp(Src) 97.8 F (36.6 C) (Oral)  Resp 12  Ht 5' 5.25" (1.657 m)  Wt 164 lb (74.39 kg)  BMI 27.09 kg/m2  SpO2 97%  General Appearance:    Alert, cooperative, no distress, appears stated age  Head:    Normocephalic, without obvious abnormality, atraumatic  Eyes:    PERRL, conjunctiva/corneas clear, EOM's intact, fundi    benign, both eyes  Ears:    Normal TM's and external ear canals, both ears  Nose:   Nares normal, septum midline, mucosa normal, no drainage    or sinus tenderness  Throat:   Lips, mucosa, and tongue normal; teeth and gums normal  Neck:   Supple, symmetrical, trachea midline, no adenopathy;    thyroid:  no enlargement/tenderness/nodules; no carotid   bruit or JVD  Back:     Symmetric, no curvature, ROM normal, no CVA tenderness  Lungs:     Clear to auscultation bilaterally, respirations unlabored  Chest Wall:    No tenderness or deformity   Heart:    Regular rate and rhythm,  S1 and S2 normal, no murmur, rub   or gallop  Breast Exam:    No tenderness, masses, or nipple abnormality  Abdomen:     Soft, non-tender, bowel sounds active all four quadrants,    no masses, no organomegaly  Genitalia:    Pelvic: cervix normal in appearance, external genitalia normal, no adnexal masses or tenderness, no cervical motion tenderness, rectovaginal septum normal, uterus normal size, shape, and consistency and vagina normal without discharge   Rectal: FOBT negative, no hemorrhoids or masses  Extremities:   Extremities normal, atraumatic, no cyanosis or edema  Pulses:   2+ and symmetric all extremities  Skin:   Skin color, texture, turgor normal, no rashes or lesions  Lymph nodes:   Cervical, supraclavicular, and axillary nodes normal  Neurologic:   CNII-XII intact, normal strength, sensation and reflexes    throughout    Assessment and Plan  Routine general medical examination at a health care facility Annual comprehensive exam was done including breast, pelvic and PAP smear. All screenings have been addressed . Baseline EKG shows normal sinus rhythm with a variable rate.   Updated Medication List Outpatient Encounter Prescriptions as of 03/09/2012  Medication Sig Dispense Refill  . ALPRAZolam (XANAX) 0.5 MG tablet TAKE 1 TABLET BY MOUTH TWICE A DAY AS NEEDED  60 tablet  3  . amitriptyline (ELAVIL) 25 MG tablet Take 1 tablet (25 mg total) by mouth at bedtime. May increase to 2 if needed after one week  60 tablet  2  . etodolac (LODINE) 500 MG tablet TAKE 1 TABLET (500 MG TOTAL) BY MOUTH 2 (TWO) TIMES DAILY.  60 tablet  11  . hydrochlorothiazide (HYDRODIURIL) 25 MG tablet Take 1 tablet (25 mg total) by mouth daily.  90 tablet  3  . HYDROcodone-acetaminophen (NORCO) 10-325 MG per tablet Take 1 tablet by mouth every 8 (eight) hours as needed for pain.  120 tablet  3  . omeprazole (PRILOSEC) 20 MG capsule Take 1 capsule (20 mg total) by mouth daily.  30 capsule  6  . sertraline (ZOLOFT) 100 MG tablet Take 1 tablet (100 mg total) by mouth daily.  90 tablet  3  . traMADol (ULTRAM) 50 MG tablet Take 2 tablets (100 mg total) by mouth every 8 (eight) hours as needed for pain.  180 tablet  3  . traMADol (ULTRAM) 50 MG tablet Take 2 tablet by mouth every 8 hours as needed      . traZODone (DESYREL) 100 MG tablet Take 100 mg by mouth 2 (two) times daily.      Marland Kitchen oxybutynin (DITROPAN) 5 MG tablet Take 1 tablet (5 mg total) by mouth 3 (three)  times daily.  60 tablet  3  . solifenacin (VESICARE) 5 MG tablet Take 1 tablet (5 mg total) by mouth daily.  90 tablet  3  . traMADol (ULTRAM) 50 MG tablet Take one tablet up to four times daily as needed.  120 tablet  3  . traZODone (DESYREL) 100 MG tablet Take 1 tablet (100 mg total) by mouth 2 (two) times daily.  60 tablet  3  . traZODone (DESYREL) 100 MG tablet Take 1 tablet (100 mg total) by mouth 2 (two) times daily.  60 tablet  3  . traZODone (DESYREL) 100 MG tablet Take 1 tablet (100 mg total) by mouth 2 (two) times daily.  180 tablet  3  . [DISCONTINUED] amoxicillin-clavulanate (AUGMENTIN) 875-125 MG per tablet Take 1 tablet by mouth 2 (two) times daily.  14 tablet  0   No facility-administered encounter medications on file as of 03/09/2012.

## 2012-03-09 NOTE — Assessment & Plan Note (Signed)
Annual comprehensive exam was done including breast, pelvic and PAP smear. All screenings have been addressed .  

## 2012-03-19 ENCOUNTER — Other Ambulatory Visit: Payer: Self-pay | Admitting: Internal Medicine

## 2012-03-19 NOTE — Telephone Encounter (Signed)
Med filled.  

## 2012-04-09 ENCOUNTER — Other Ambulatory Visit: Payer: Self-pay | Admitting: Internal Medicine

## 2012-04-22 LAB — HM PAP SMEAR: HM Pap smear: NORMAL

## 2012-05-14 ENCOUNTER — Other Ambulatory Visit: Payer: Self-pay | Admitting: Internal Medicine

## 2012-05-14 NOTE — Telephone Encounter (Signed)
Rx sent to pharmacy by escript  

## 2012-05-30 ENCOUNTER — Other Ambulatory Visit: Payer: Self-pay | Admitting: Internal Medicine

## 2012-06-20 ENCOUNTER — Other Ambulatory Visit: Payer: Self-pay | Admitting: Internal Medicine

## 2012-06-22 NOTE — Telephone Encounter (Signed)
Please Advise

## 2012-06-22 NOTE — Telephone Encounter (Signed)
Ok to refill,  Authorized in epic 

## 2012-06-22 NOTE — Telephone Encounter (Signed)
Rx faxed to pharmacy  

## 2012-07-14 ENCOUNTER — Other Ambulatory Visit: Payer: Self-pay | Admitting: Internal Medicine

## 2012-07-14 NOTE — Telephone Encounter (Signed)
Last OV 3/14, ok refill?

## 2012-07-15 NOTE — Telephone Encounter (Signed)
Rx faxed to pharmacy  

## 2012-07-17 ENCOUNTER — Other Ambulatory Visit: Payer: Self-pay | Admitting: Internal Medicine

## 2012-07-17 NOTE — Telephone Encounter (Signed)
Okay to refill? 

## 2012-07-24 ENCOUNTER — Telehealth: Payer: Self-pay | Admitting: Internal Medicine

## 2012-09-15 NOTE — Telephone Encounter (Signed)
She has refills on the July tramadol prescription  That should take her through October

## 2012-09-15 NOTE — Telephone Encounter (Signed)
May I refill tramadol please advise.

## 2012-10-13 ENCOUNTER — Other Ambulatory Visit: Payer: Self-pay | Admitting: Internal Medicine

## 2012-10-14 NOTE — Telephone Encounter (Signed)
Last visit 03/09/12, refill?

## 2012-10-15 NOTE — Telephone Encounter (Signed)
Left message that Rx ready for pickup and controlled substance contract will need to be signed.

## 2012-10-22 LAB — HM MAMMOGRAPHY: HM MAMMO: NORMAL

## 2012-10-27 ENCOUNTER — Telehealth: Payer: Self-pay | Admitting: Internal Medicine

## 2012-10-27 MED ORDER — HYDROCODONE-ACETAMINOPHEN 10-325 MG PO TABS: ORAL_TABLET | ORAL | Status: DC

## 2012-10-27 NOTE — Telephone Encounter (Signed)
Last OV 03/09/12 please advise to refill patient has signed Narcotic agreement. Last fill 6/14.

## 2012-10-27 NOTE — Telephone Encounter (Signed)
Pt is needing refill on Hydrocodone. Pt says she takes it every 8 hours.

## 2012-10-27 NOTE — Telephone Encounter (Signed)
Refill given, but she needs to pick it up herself (Needs UDS when picks up rx )

## 2012-10-27 NOTE — Telephone Encounter (Signed)
Patient notified script ready for pick up and note placed on script for UDS.

## 2012-10-28 ENCOUNTER — Encounter: Payer: Self-pay | Admitting: *Deleted

## 2012-11-03 ENCOUNTER — Encounter: Payer: Self-pay | Admitting: Internal Medicine

## 2012-11-11 ENCOUNTER — Other Ambulatory Visit: Payer: Self-pay | Admitting: Internal Medicine

## 2012-11-11 NOTE — Telephone Encounter (Signed)
Eprescribed.

## 2012-11-18 ENCOUNTER — Other Ambulatory Visit: Payer: Self-pay | Admitting: Internal Medicine

## 2012-11-18 ENCOUNTER — Telehealth: Payer: Self-pay | Admitting: Internal Medicine

## 2012-11-18 MED ORDER — TRAMADOL HCL 50 MG PO TABS: ORAL_TABLET | ORAL | Status: DC

## 2012-11-18 MED ORDER — ALPRAZOLAM 0.5 MG PO TABS: ORAL_TABLET | ORAL | Status: DC

## 2012-11-18 NOTE — Telephone Encounter (Signed)
Last visit 03/09/12, refill? There is also a patient call encounter request

## 2012-11-18 NOTE — Telephone Encounter (Signed)
Please advise as to refill. Last OV 03/09/12

## 2012-11-18 NOTE — Telephone Encounter (Signed)
30 day refill only,  Needs OV  prior to any more refills

## 2012-11-18 NOTE — Telephone Encounter (Signed)
States pharmacy is faxing paperwork.  States she needs alprazolam and tramadol.  Is completely out.

## 2012-11-19 NOTE — Telephone Encounter (Signed)
Left message for patient to return my call.

## 2012-11-19 NOTE — Telephone Encounter (Signed)
Patient notified scripts faxed appointment made.

## 2012-11-20 ENCOUNTER — Telehealth: Payer: Self-pay | Admitting: Internal Medicine

## 2012-11-20 NOTE — Telephone Encounter (Signed)
Pt left vm 11/13.  States she is returning a call. °

## 2012-12-08 ENCOUNTER — Ambulatory Visit (INDEPENDENT_AMBULATORY_CARE_PROVIDER_SITE_OTHER): Payer: Medicare Other | Admitting: Internal Medicine

## 2012-12-08 ENCOUNTER — Encounter: Payer: Self-pay | Admitting: Internal Medicine

## 2012-12-08 VITALS — BP 124/74 | HR 72 | Temp 97.5°F | Resp 14 | Ht 65.25 in | Wt 170.5 lb

## 2012-12-08 DIAGNOSIS — Z8639 Personal history of other endocrine, nutritional and metabolic disease: Secondary | ICD-10-CM

## 2012-12-08 DIAGNOSIS — R5381 Other malaise: Secondary | ICD-10-CM

## 2012-12-08 DIAGNOSIS — R5383 Other fatigue: Secondary | ICD-10-CM

## 2012-12-08 DIAGNOSIS — Z23 Encounter for immunization: Secondary | ICD-10-CM

## 2012-12-08 DIAGNOSIS — R51 Headache: Secondary | ICD-10-CM

## 2012-12-08 DIAGNOSIS — M129 Arthropathy, unspecified: Secondary | ICD-10-CM

## 2012-12-08 DIAGNOSIS — R519 Headache, unspecified: Secondary | ICD-10-CM

## 2012-12-08 DIAGNOSIS — Z9884 Bariatric surgery status: Secondary | ICD-10-CM

## 2012-12-08 DIAGNOSIS — F411 Generalized anxiety disorder: Secondary | ICD-10-CM

## 2012-12-08 DIAGNOSIS — E119 Type 2 diabetes mellitus without complications: Secondary | ICD-10-CM

## 2012-12-08 DIAGNOSIS — F419 Anxiety disorder, unspecified: Secondary | ICD-10-CM

## 2012-12-08 DIAGNOSIS — Z862 Personal history of diseases of the blood and blood-forming organs and certain disorders involving the immune mechanism: Secondary | ICD-10-CM

## 2012-12-08 DIAGNOSIS — M1990 Unspecified osteoarthritis, unspecified site: Secondary | ICD-10-CM

## 2012-12-08 LAB — COMPREHENSIVE METABOLIC PANEL
AST: 18 U/L (ref 0–37)
Albumin: 3.7 g/dL (ref 3.5–5.2)
BUN: 18 mg/dL (ref 6–23)
CO2: 29 mEq/L (ref 19–32)
Calcium: 9.1 mg/dL (ref 8.4–10.5)
Chloride: 103 mEq/L (ref 96–112)
Creatinine, Ser: 1 mg/dL (ref 0.4–1.2)
GFR: 56.96 mL/min — ABNORMAL LOW (ref >60.00)
Glucose, Bld: 95 mg/dL (ref 70–99)
Potassium: 3.6 mEq/L (ref 3.5–5.1)
Total Protein: 6.2 g/dL (ref 6.0–8.3)

## 2012-12-08 LAB — TSH: TSH: 1.68 u[IU]/mL (ref 0.35–5.50)

## 2012-12-08 LAB — HEMOGLOBIN A1C: Hgb A1c MFr Bld: 5.8 % (ref 4.6–6.5)

## 2012-12-08 MED ORDER — TETANUS-DIPHTH-ACELL PERTUSSIS 5-2.5-18.5 LF-MCG/0.5 IM SUSP: 0.5000 mL | Freq: Once | INTRAMUSCULAR | Status: DC

## 2012-12-08 MED ORDER — HYDROCODONE-ACETAMINOPHEN 10-325 MG PO TABS: ORAL_TABLET | ORAL | Status: DC

## 2012-12-08 NOTE — Patient Instructions (Signed)
Your still need your tetanus-diptheria-pertussis vaccine (TDaP) but you can get it for less $$$ at the health Dept with the script I have provided you. Diabetes and Exercise Exercising regularly is important. It is not just about losing weight. It has many health benefits, such as:  Improving your overall fitness, flexibility, and endurance.  Increasing your bone density.  Helping with weight control.  Decreasing your body fat.  Increasing your muscle strength.  Reducing stress and tension.  Improving your overall health. People with diabetes who exercise gain additional benefits because exercise:  Reduces appetite.  Improves the body's use of blood sugar (glucose).  Helps lower or control blood glucose.  Decreases blood pressure.  Helps control blood lipids (such as cholesterol and triglycerides).  Improves the body's use of the hormone insulin by:  Increasing the body's insulin sensitivity.  Reducing the body's insulin needs.  Decreases the risk for heart disease because exercising:  Lowers cholesterol and triglycerides levels.  Increases the levels of good cholesterol (such as high-density lipoproteins [HDL]) in the body.  Lowers blood glucose levels. YOUR ACTIVITY PLAN  Choose an activity that you enjoy and set realistic goals. Your health care provider or diabetes educator can help you make an activity plan that works for you. You can break activities into 2 or 3 sessions throughout the day. Doing so is as good as one long session. Exercise ideas include:  Taking the dog for a walk.  Taking the stairs instead of the elevator.  Dancing to your favorite song.  Doing your favorite exercise with a friend. RECOMMENDATIONS FOR EXERCISING WITH TYPE 1 OR TYPE 2 DIABETES   Check your blood glucose before exercising. If blood glucose levels are greater than 240 mg/dL, check for urine ketones. Do not exercise if ketones are present.  Avoid injecting insulin into areas  of the body that are going to be exercised. For example, avoid injecting insulin into:  The arms when playing tennis.  The legs when jogging.  Keep a record of:  Food intake before and after you exercise.  Expected peak times of insulin action.  Blood glucose levels before and after you exercise.  The type and amount of exercise you have done.  Review your records with your health care provider. Your health care provider will help you to develop guidelines for adjusting food intake and insulin amounts before and after exercising.  If you take insulin or oral hypoglycemic agents, watch for signs and symptoms of hypoglycemia. They include:  Dizziness.  Shaking.  Sweating.  Chills.  Confusion.  Drink plenty of water while you exercise to prevent dehydration or heat stroke. Body water is lost during exercise and must be replaced.  Talk to your health care provider before starting an exercise program to make sure it is safe for you. Remember, almost any type of activity is better than none. Document Released: 03/16/2003 Document Revised: 08/26/2012 Document Reviewed: 06/02/2012 [facility] Patient Information 2014 [place], [place].

## 2012-12-08 NOTE — Progress Notes (Signed)
Pre-visit discussion using our clinic review tool. No additional management support is needed unless otherwise documented below in the visit note.  

## 2012-12-09 ENCOUNTER — Other Ambulatory Visit: Payer: Self-pay | Admitting: Internal Medicine

## 2012-12-09 ENCOUNTER — Encounter: Payer: Self-pay | Admitting: Internal Medicine

## 2012-12-09 LAB — HM DIABETES EYE EXAM

## 2012-12-09 NOTE — Assessment & Plan Note (Signed)
She reached a nadir BMI of 26 at 162 lbs in Jan 2014 and has gained 8 lbs since then because she is not exercising.  Long talk with her today about her need to maintain a healthy lifestyle which includes regular exercise for 30 minutes a minimum of 5 days per week .  Checking vitamin levels today

## 2012-12-09 NOTE — Assessment & Plan Note (Signed)
Managed with trazodone, sertraline and alprazolam prn.

## 2012-12-09 NOTE — Assessment & Plan Note (Signed)
Chronic , multiple joints.  Using etodolac, tramadol and vicodin prn ,  Refill on vicodin given today for maxiumum of 4 daily .  narcotic contract in place.

## 2012-12-09 NOTE — Assessment & Plan Note (Addendum)
DDX included vasculitis but ESR was normal.  Improved  with discontinuation of oxybutynin and use of low dose elavil qhs .  Cervical spine films noted degenerative changes and mild disk space narrowing at C4-c5 level.

## 2012-12-09 NOTE — Assessment & Plan Note (Signed)
She has been normoglycemic since having her gastric bypass surggery.  Foot exam was normal today.  Reminder for annual diabetic eye exam given.  Lab Results  Component Value Date   HGBA1C 5.8 12/08/2012   Lab Results  Component Value Date   MICROALBUR 0.2 12/16/2011

## 2012-12-09 NOTE — Progress Notes (Signed)
Patient ID: Jodi [name], female   DOB: 09/29/1945, 67 y.o.   MRN: [id number]   Patient Active Problem List   Diagnosis Date Noted  . Routine general medical examination at a health care facility 03/09/2012  . New onset of headaches after age 64 12/17/2011  . Diabetes mellitus without complication   . Arthritis   . Anxiety   . History of gastric bypass 10/30/2011  . Personal history of diabetes mellitus 10/30/2011    Subjective:  CC:   Chief Complaint  Patient presents with  . Follow-up    medication refills    HPI:   Jodi [name] a 67 y.o. female who presents for 6 month Follow up on diet controlled diabetes, hypertension , chronic DJD /arthritis in bilateral knees, generalized anxiety, and obesity s/p gastric bypass surgery over 3 years ago. .  She is following a low glycemic index diet most days but has gained 6 lbs  Over the last 6 months and is not exercising because her Elliptiglider is not currently functional.  Total wt gain 8 lbs after reaching a nadir of 162 lbs in January. Blood sugars have been in the normal range both fasting and post prandial.  She is overdue for eye exam.   Has daily chronic knee pain requiring use of vicodin up to 4 daily.  NSAIDs were not advised folllowing gastric bypass surgery , but she tried to use etodolac and tramadol for pain control which was not effective   Headaches, chronic,  Have improved with stopping oxybutynin and using low dose elabil at bedtime. Has ca history of chronic tinnitus  for years and years, She has OA of the neck which causes chronic  neck pain and hears/feels grinding when she turns her head from side to side.    Past Medical History  Diagnosis Date  . Diabetes mellitus without complication     diet controlled since gastric bypass  . Arthritis   . Anxiety     Past Surgical History  Procedure Laterality Date  . Bladder suspension    . Cholecystectomy    . Tracheotomy      at age 15 due to respiratroy  failure  . Joint replacement      left hip, right knee  . Roux-en-y procedure  2010       The following portions of the patient's history were reviewed and updated as appropriate: Allergies, current medications, and problem list.    Review of Systems:   12 Pt  review of systems was negative except those addressed in the HPI,     History   Social History  . Marital Status: Widowed    Spouse Name: N/A    Number of Children: N/A  . Years of Education: N/A   Occupational History  . Not on file.   Social History Main Topics  . Smoking status: Never Smoker   . Smokeless tobacco: Never Used  . Alcohol Use: No  . Drug Use: No  . Sexual Activity: Not Currently   Other Topics Concern  . Not on file   Social History Narrative  . No narrative on file    Objective:  Filed Vitals:   12/08/12 1026  BP: 124/74  Pulse: 72  Temp: 97.5 F (36.4 C)  Resp: 14     General appearance: alert, cooperative and appears stated age Ears: normal TM's and external ear canals both ears Throat: lips, mucosa, and tongue normal; teeth and gums normal Neck: no adenopathy, no  carotid bruit, supple, symmetrical, trachea midline and thyroid not enlarged, symmetric, no tenderness/mass/nodules Back: symmetric, no curvature. ROM normal. No CVA tenderness. Lungs: clear to auscultation bilaterally Heart: regular rate and rhythm, S1, S2 normal, no murmur, click, rub or gallop Abdomen: soft, non-tender; bowel sounds normal; no masses,  no organomegaly Pulses: 2+ and symmetric Skin: Skin color, texture, turgor normal. No rashes or lesions Lymph nodes: Cervical, supraclavicular, and axillary nodes normal. Foot exam:  Nails are well trimmed,  No callouses,  Sensation intact to microfilament   Assessment and Plan:  New onset of headaches after age 57 DDX included vasculitis but ESR was normal.  Improved  with discontinuation of oxybutynin and use of low dose elavil qhs .  Cervical spine films  noted degenerative changes and mild disk space narrowing at C4-c5 level.   Personal history of diabetes mellitus She has been normoglycemic since having her gastric bypass surggery.  Foot exam was normal today.  Reminder for annual diabetic eye exam given.  Lab Results  Component Value Date   HGBA1C 5.8 12/08/2012   Lab Results  Component Value Date   MICROALBUR 0.2 12/16/2011     History of gastric bypass She reached a nadir BMI of 26 at 162 lbs in Jan 2014 and has gained 8 lbs since then because she is not exercising.  Long talk with her today about her need to maintain a healthy lifestyle which includes regular exercise for 30 minutes a minimum of 5 days per week .  Checking vitamin levels today  Arthritis Chronic , multiple joints.  Using etodolac, tramadol and vicodin prn ,  Refill on vicodin given today for maxiumum of 4 daily .  narcotic contract in place.  Anxiety Managed with trazodone, sertraline and alprazolam prn.   A total of 25 minutes was spent with patient more than half of which was spent in counseling, reviewing records and coordination of care. Updated Medication List Outpatient Encounter Prescriptions as of 12/08/2012  Medication Sig  . ALPRAZolam (XANAX) 0.5 MG tablet TAKE 1 TABLET BY MOUTH TWICE A DAY AS NEEDED  . amitriptyline (ELAVIL) 25 MG tablet TAKE 1 TABLET BY MOUTH AT BEDTIME AND MAY INCREASE TO 2 TABLETS IF NEEDED AFTER 1 WEEK  . etodolac (LODINE) 500 MG tablet TAKE 1 TABLET (500 MG TOTAL) BY MOUTH 2 (TWO) TIMES DAILY.  . hydrochlorothiazide (HYDRODIURIL) 25 MG tablet TAKE 1 TABLET (25 MG TOTAL) BY MOUTH DAILY.  Marland Kitchen HYDROcodone-acetaminophen (NORCO) 10-325 MG per tablet TAKE 1 TABLET BY MOUTH EVERY 6 HOURS AS NEEDED FOR PAIN  . omeprazole (PRILOSEC) 20 MG capsule TAKE 1 CAPSULE (20 MG TOTAL) BY MOUTH DAILY.  Marland Kitchen sertraline (ZOLOFT) 100 MG tablet TAKE 1 TABLET BY MOUTH DAILY.  . traMADol (ULTRAM) 50 MG tablet TAKE 2 TABLETS (100 MG TOTAL) BY MOUTH EVERY 8  (EIGHT) HOURS AS NEEDED FOR PAIN.  . traZODone (DESYREL) 100 MG tablet Take 100 mg by mouth 2 (two) times daily.  . [DISCONTINUED] HYDROcodone-acetaminophen (NORCO) 10-325 MG per tablet TAKE 1 TABLET BY MOUTH EVERY 8 HOURS AS NEEDED FOR PAIN  . Tdap (BOOSTRIX) 5-2.5-18.5 LF-MCG/0.5 injection Inject 0.5 mLs into the muscle once.  . [DISCONTINUED] amitriptyline (ELAVIL) 25 MG tablet TAKE 1 TABLET BY MOUTH AT BEDTIME AND MAY INCREASE TO 2 TABLETS IF NEEDED AFTER 1 WEEK  . [DISCONTINUED] omeprazole (PRILOSEC) 20 MG capsule TAKE 1 CAPSULE (20 MG TOTAL) BY MOUTH DAILY.  . [DISCONTINUED] oxybutynin (DITROPAN) 5 MG tablet Take 1 tablet (5 mg total) by  mouth 3 (three) times daily.  . [DISCONTINUED] solifenacin (VESICARE) 5 MG tablet Take 1 tablet (5 mg total) by mouth daily.  . [DISCONTINUED] traZODone (DESYREL) 100 MG tablet Take 1 tablet (100 mg total) by mouth 2 (two) times daily.  . [DISCONTINUED] traZODone (DESYREL) 100 MG tablet Take 1 tablet (100 mg total) by mouth 2 (two) times daily.  . [DISCONTINUED] traZODone (DESYREL) 100 MG tablet TAKE 1 TABLET (100 MG TOTAL) BY MOUTH 2 (TWO) TIMES DAILY.

## 2012-12-10 ENCOUNTER — Encounter: Payer: Self-pay | Admitting: *Deleted

## 2012-12-16 LAB — HM DIABETES EYE EXAM

## 2012-12-22 ENCOUNTER — Other Ambulatory Visit: Payer: Self-pay | Admitting: Internal Medicine

## 2012-12-23 ENCOUNTER — Other Ambulatory Visit: Payer: Self-pay

## 2013-01-08 ENCOUNTER — Other Ambulatory Visit: Payer: Self-pay | Admitting: Internal Medicine

## 2013-01-16 ENCOUNTER — Other Ambulatory Visit: Payer: Self-pay | Admitting: Internal Medicine

## 2013-01-18 NOTE — Telephone Encounter (Signed)
Ok to refill,  printed rx  

## 2013-01-29 ENCOUNTER — Telehealth: Payer: Self-pay | Admitting: *Deleted

## 2013-01-29 NOTE — Telephone Encounter (Signed)
Called [phone] for prior authorization on the Amitriptylin tab 25 mg, received PA request form place in Dr.Tullos box

## 2013-02-07 ENCOUNTER — Encounter: Payer: Self-pay | Admitting: Internal Medicine

## 2013-02-07 DIAGNOSIS — R51 Headache: Secondary | ICD-10-CM

## 2013-02-07 DIAGNOSIS — R519 Headache, unspecified: Secondary | ICD-10-CM | POA: Insufficient documentation

## 2013-02-16 ENCOUNTER — Telehealth: Payer: Self-pay | Admitting: *Deleted

## 2013-02-16 ENCOUNTER — Other Ambulatory Visit: Payer: Self-pay | Admitting: Internal Medicine

## 2013-02-16 NOTE — Telephone Encounter (Signed)
Last visit 12/08/12, ok refill?

## 2013-02-16 NOTE — Telephone Encounter (Signed)
Received PA request form for the Amitriptylin, form placed in Dr.Tulo Box

## 2013-02-17 NOTE — Telephone Encounter (Signed)
Ok to refill,  Refill sent  

## 2013-02-18 ENCOUNTER — Telehealth: Payer: Self-pay | Admitting: Internal Medicine

## 2013-02-18 NOTE — Telephone Encounter (Signed)
Her insurance will no longer provide coverage for amitriptyline  For her chronic headaches so we will have to go through a prior authorization,  Which is very time consuming.  There will a $29 charge to patient for the process because Dr  Derrel Nip and the staff spend approximately 20 to 30 minutes per authorization.  She can thank her insurance company for this.  If she would like to try going without it first , we can do that.

## 2013-02-25 NOTE — Telephone Encounter (Signed)
Left message for patient to call office.  

## 2013-03-10 ENCOUNTER — Other Ambulatory Visit: Payer: Self-pay | Admitting: *Deleted

## 2013-03-10 MED ORDER — AMITRIPTYLINE HCL 25 MG PO TABS: ORAL_TABLET | ORAL | Status: DC

## 2013-03-11 ENCOUNTER — Telehealth: Payer: Self-pay | Admitting: Internal Medicine

## 2013-03-11 NOTE — Telephone Encounter (Signed)
HYDROcodone-acetaminophen (NORCO) 10-325 MG per tablet  Needing prior authorization for amitriptyline (ELAVIL) 25 MG tablet

## 2013-03-12 ENCOUNTER — Other Ambulatory Visit: Payer: Self-pay | Admitting: *Deleted

## 2013-03-12 NOTE — Telephone Encounter (Signed)
Received PA request form, for the Amitriptyline, placed in Dr.tullo box

## 2013-03-12 NOTE — Telephone Encounter (Signed)
Ok refill? 

## 2013-03-15 MED ORDER — HYDROCODONE-ACETAMINOPHEN 10-325 MG PO TABS: ORAL_TABLET | ORAL | Status: DC

## 2013-03-15 NOTE — Telephone Encounter (Signed)
Notified patient placed up front for pick up. 

## 2013-03-15 NOTE — Telephone Encounter (Signed)
Ok to refill, 

## 2013-03-16 DIAGNOSIS — Z0279 Encounter for issue of other medical certificate: Secondary | ICD-10-CM

## 2013-04-16 ENCOUNTER — Other Ambulatory Visit: Payer: Self-pay | Admitting: Internal Medicine

## 2013-04-16 NOTE — Telephone Encounter (Signed)
Ok to fill 

## 2013-04-19 NOTE — Telephone Encounter (Signed)
Script faxed.

## 2013-04-21 ENCOUNTER — Telehealth: Payer: Self-pay | Admitting: *Deleted

## 2013-04-21 NOTE — Telephone Encounter (Signed)
Needs to be seen for annual exam before the end of the month,    Can pick up ex then

## 2013-04-21 NOTE — Telephone Encounter (Signed)
Patient calling for refill on Norco last fill 03/15/13 ok to fill?

## 2013-04-22 ENCOUNTER — Encounter: Payer: Self-pay | Admitting: Internal Medicine

## 2013-04-22 ENCOUNTER — Ambulatory Visit (INDEPENDENT_AMBULATORY_CARE_PROVIDER_SITE_OTHER): Payer: Medicare Other | Admitting: Internal Medicine

## 2013-04-22 VITALS — BP 134/68 | HR 90 | Temp 98.4°F | Resp 16 | Ht 64.5 in | Wt 182.5 lb

## 2013-04-22 DIAGNOSIS — G894 Chronic pain syndrome: Secondary | ICD-10-CM

## 2013-04-22 DIAGNOSIS — Z1239 Encounter for other screening for malignant neoplasm of breast: Secondary | ICD-10-CM

## 2013-04-22 DIAGNOSIS — Z0000 Encounter for general adult medical examination without abnormal findings: Secondary | ICD-10-CM

## 2013-04-22 DIAGNOSIS — F411 Generalized anxiety disorder: Secondary | ICD-10-CM

## 2013-04-22 DIAGNOSIS — M129 Arthropathy, unspecified: Secondary | ICD-10-CM

## 2013-04-22 DIAGNOSIS — F419 Anxiety disorder, unspecified: Secondary | ICD-10-CM

## 2013-04-22 DIAGNOSIS — E119 Type 2 diabetes mellitus without complications: Secondary | ICD-10-CM

## 2013-04-22 DIAGNOSIS — H9319 Tinnitus, unspecified ear: Secondary | ICD-10-CM

## 2013-04-22 DIAGNOSIS — M1990 Unspecified osteoarthritis, unspecified site: Secondary | ICD-10-CM

## 2013-04-22 DIAGNOSIS — H9190 Unspecified hearing loss, unspecified ear: Secondary | ICD-10-CM

## 2013-04-22 LAB — MICROALBUMIN / CREATININE URINE RATIO
CREATININE, U: 88 mg/dL
MICROALB UR: 0.4 mg/dL (ref 0.0–1.9)
Microalb Creat Ratio: 0.5 mg/g (ref 0.0–30.0)

## 2013-04-22 LAB — COMPREHENSIVE METABOLIC PANEL
ALT: 15 U/L (ref 0–35)
AST: 19 U/L (ref 0–37)
Albumin: 3.2 g/dL — ABNORMAL LOW (ref 3.5–5.2)
Alkaline Phosphatase: 70 U/L (ref 39–117)
BILIRUBIN TOTAL: 0.6 mg/dL (ref 0.3–1.2)
BUN: 13 mg/dL (ref 6–23)
CHLORIDE: 103 meq/L (ref 96–112)
CO2: 30 mEq/L (ref 19–32)
CREATININE: 0.9 mg/dL (ref 0.4–1.2)
Calcium: 8.7 mg/dL (ref 8.4–10.5)
GFR: 63.23 mL/min (ref >60.00)
Glucose, Bld: 82 mg/dL (ref 70–99)
Potassium: 3.9 mEq/L (ref 3.5–5.1)
Sodium: 139 mEq/L (ref 135–145)
Total Protein: 5.8 g/dL — ABNORMAL LOW (ref 6.0–8.3)

## 2013-04-22 LAB — LIPID PANEL
Cholesterol: 170 mg/dL (ref 0–200)
HDL: 67 mg/dL (ref >39.00)
LDL Cholesterol: 74 mg/dL (ref 0–99)
TRIGLYCERIDES: 143 mg/dL (ref 0.0–149.0)
Total CHOL/HDL Ratio: 3
VLDL: 28.6 mg/dL (ref 0.0–40.0)

## 2013-04-22 LAB — HEMOGLOBIN A1C: HEMOGLOBIN A1C: 5.4 % (ref 4.6–6.5)

## 2013-04-22 MED ORDER — MELOXICAM 15 MG PO TABS: 15.0000 mg | ORAL_TABLET | Freq: Every day | ORAL | Status: DC

## 2013-04-22 MED ORDER — HYDROCODONE-ACETAMINOPHEN 10-325 MG PO TABS: ORAL_TABLET | ORAL | Status: DC

## 2013-04-22 MED ORDER — BUPROPION HCL ER (SR) 100 MG PO TB12: 100.0000 mg | ORAL_TABLET | Freq: Two times a day (BID) | ORAL | Status: DC

## 2013-04-22 NOTE — Patient Instructions (Addendum)
You had your annual Medicare wellness exam today  We will schedule your mammogram soon at [facility] Referral to [facility] is in progress  to discuss BRCA genetic testing .   You need to have a TDaP vaccine and a Shingles vaccine.  I have given you prescriptions for thses because they will be cheaper at the health Dept or at your  local pharmacy because Medicare will not reimburse for them.    We will contact you with the bloodwork results   For your depression, we are changing your medication.   Take zoloft  One tablet daily for one more week,  Then 1/2 tablet daily for a week,   then stop  Start the wellbutrin tomorrow.  One tablet twice daily ,  2nd dose by 2 pm to avoid insomnia.  We can increase the dose in a month if necessary    Changing etodolac to meloxicam one tablet daily for arthritis

## 2013-04-22 NOTE — Assessment & Plan Note (Signed)
Annual comprehensive exam was done including breast, excluding pelvic and PAP smear. All screenings have been addressed .  

## 2013-04-22 NOTE — Progress Notes (Signed)
Patient ID: Jodi [name], female   DOB: 1945-06-06, 68 y.o.   MRN: [id number]  The patient is here for annual Medicare wellness examination and management of other chronic and acute problems.  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 Discussed chaning zoloft to wellbutrin    The risk factors are reflected in the social history.  The roster of all physicians providing medical care to patient - is listed in the Snapshot section of the chart.  Activities of daily living:  The patient is 100% independent in all ADLs: dressing, toileting, feeding as well as independent mobility  Home safety : The patient has smoke detectors in the home. They wear seatbelts.  There are no firearms at home. There is no violence in the home.   There is no risks for hepatitis, STDs or HIV. There is no   history of blood transfusion. They have no travel history to infectious disease endemic areas of the world.  The patient has seen their dentist in the last six month. They have seen their eye doctor in the last year. They admit to slight hearing difficulty with regard to whispered voices and some television programs.  They have deferred audiologic testing in the last year.  They do not  have excessive sun exposure. Discussed the need for sun protection: hats, long sleeves and use of sunscreen if there is significant sun exposure.   Diet: the importance of a healthy diet is discussed. They do have a healthy diet.  The benefits of regular aerobic exercise were discussed. She walks 4 times per week ,  20 minutes.   Depression screen: there are no signs or vegative symptoms of depression- irritability, change in appetite, anhedonia, sadness/tearfullness.  Cognitive assessment: the patient manages all their financial and personal affairs and is actively engaged. They could relate day,date,year and  events; recalled 2/3 objects at 3 minutes; performed clock-face test normally.  The following portions of the patient's history were reviewed and updated as appropriate: allergies, current medications, past family history, past medical history,  past surgical history, past social history  and problem list.  Visual acuity was not assessed per patient preference since she has regular follow up with her ophthalmologist. Hearing and body mass index were assessed and reviewed.   During the course of the visit the patient was educated and counseled about appropriate screening and preventive services including : fall prevention , diabetes screening, nutrition counseling, colorectal cancer screening, and recommended immunizations.    Objective:  BP 134/68  Pulse 90  Temp(Src) 98.4 F (36.9 C) (Oral)  Resp 16  Ht 5' 4.5" (1.638 m)  Wt 182 lb 8 oz (82.781 kg)  BMI 30.85 kg/m2  SpO2 98%  General appearance: alert, cooperative and appears stated age Head: Normocephalic, without obvious abnormality, atraumatic Eyes: conjunctivae/corneas clear. PERRL, EOM's intact. Fundi benign. Ears: normal TM's and external ear canals both ears Nose: Nares normal. Septum midline. Mucosa normal. No drainage or sinus tenderness. Throat: lips, mucosa, and tongue normal; teeth and gums normal Neck: no adenopathy, no carotid bruit, no JVD, supple, symmetrical, trachea midline and thyroid not enlarged, symmetric, no tenderness/mass/nodules Lungs: clear to auscultation bilaterally Breasts: normal appearance, no masses or tenderness Heart: regular rate and rhythm, S1, S2 normal, no murmur, click, rub or gallop Abdomen: soft, non-tender; bowel sounds normal; no masses,  no organomegaly Extremities: extremities normal, atraumatic, no  cyanosis or edema Pulses: 2+ and symmetric Skin: Skin color, texture, turgor normal. No rashes or lesions Neurologic: Alert and oriented X 3, normal strength and tone. Normal symmetric  reflexes. Normal coordination and gait.   Assessment and Plan:  Routine general medical examination at a health care facility Annual comprehensive exam was done including breast,excluding  pelvic and PAP smear. All screenings have been addressed .   Arthritis Trial of meloxicam instead of etodolac   Anxiety Discussed change in therapy from zoloft to wellbutrin for lack of concentration    Updated Medication List Outpatient Encounter Prescriptions as of 04/22/2013  Medication Sig  . ALPRAZolam (XANAX) 0.5 MG tablet TAKE 1 TABLET BY MOUTH TWICE A DAY AS NEEDED  . amitriptyline (ELAVIL) 25 MG tablet TAKE 1 TABLET BY MOUTH AT BEDTIME AND MAY INCREASE TO 2 TABLETS IF NEEDED AFTER 1 WEEK  . hydrochlorothiazide (HYDRODIURIL) 25 MG tablet TAKE 1 TABLET (25 MG TOTAL) BY MOUTH DAILY.  Marland Kitchen HYDROcodone-acetaminophen (NORCO) 10-325 MG per tablet TAKE 1 TABLET BY MOUTH EVERY 6 HOURS AS NEEDED FOR PAIN  . omeprazole (PRILOSEC) 20 MG capsule TAKE 1 CAPSULE (20 MG TOTAL) BY MOUTH DAILY.  . traMADol (ULTRAM) 50 MG tablet TAKE 2 TABLETS BY MOUTH EVERY 8 HOURS AS NEEDED FOR PAIN  . traZODone (DESYREL) 100 MG tablet Take 100 mg by mouth 2 (two) times daily.  . [DISCONTINUED] etodolac (LODINE) 500 MG tablet TAKE 1 TABLET (500 MG TOTAL) BY MOUTH 2 (TWO) TIMES DAILY.  . [DISCONTINUED] HYDROcodone-acetaminophen (NORCO) 10-325 MG per tablet TAKE 1 TABLET BY MOUTH EVERY 6 HOURS AS NEEDED FOR PAIN  . [DISCONTINUED] sertraline (ZOLOFT) 100 MG tablet TAKE 1 TABLET BY MOUTH DAILY.  Marland Kitchen buPROPion (WELLBUTRIN SR) 100 MG 12 hr tablet Take 1 tablet (100 mg total) by mouth 2 (two) times daily.  . meloxicam (MOBIC) 15 MG tablet Take 1 tablet (15 mg total) by mouth daily.  . Tdap (BOOSTRIX) 5-2.5-18.5 LF-MCG/0.5 injection Inject 0.5 mLs into the muscle once.  . [DISCONTINUED] amitriptyline (ELAVIL) 25 MG tablet TAKE 1 TABLET BY MOUTH AT BEDTIME AND MAY INCREASE TO 2 TABLETS IF NEEDED AFTER 1 WEEK  . [DISCONTINUED] omeprazole  (PRILOSEC) 20 MG capsule TAKE 1 CAPSULE (20 MG TOTAL) BY MOUTH DAILY.  . [DISCONTINUED] traZODone (DESYREL) 100 MG tablet TAKE 1 TABLET (100 MG TOTAL) BY MOUTH 2 (TWO) TIMES DAILY.

## 2013-04-22 NOTE — Telephone Encounter (Signed)
Seen in office given script for Norco.

## 2013-04-23 ENCOUNTER — Encounter: Payer: Self-pay | Admitting: *Deleted

## 2013-04-23 LAB — DRUG SCREEN, URINE
Amphetamine Screen, Ur: NEGATIVE
Barbiturate Quant, Ur: NEGATIVE
Benzodiazepines.: POSITIVE — AB
CREATININE, U: 96.14 mg/dL
Cocaine Metabolites: NEGATIVE
Marijuana Metabolite: NEGATIVE
Methadone: NEGATIVE
Opiates: POSITIVE — AB
PHENCYCLIDINE (PCP): NEGATIVE
Propoxyphene: NEGATIVE

## 2013-04-24 NOTE — Assessment & Plan Note (Signed)
Discussed change in therapy from zoloft to wellbutrin for lack of concentration

## 2013-04-24 NOTE — Assessment & Plan Note (Signed)
Trial of meloxicam instead of etodolac

## 2013-04-26 ENCOUNTER — Encounter: Payer: Self-pay | Admitting: Emergency Medicine

## 2013-05-05 ENCOUNTER — Telehealth: Payer: Self-pay

## 2013-05-05 NOTE — Telephone Encounter (Signed)
Relevant patient education mailed to patient.  

## 2013-05-22 LAB — HM MAMMOGRAPHY: HM MAMMO: NORMAL

## 2013-05-24 ENCOUNTER — Other Ambulatory Visit: Payer: Self-pay | Admitting: *Deleted

## 2013-05-24 NOTE — Telephone Encounter (Signed)
Last visit 04/22/13, refill?

## 2013-05-25 MED ORDER — TRAMADOL HCL 50 MG PO TABS
ORAL_TABLET | ORAL | Status: DC
Start: ? — End: 2013-08-18

## 2013-05-25 NOTE — Telephone Encounter (Signed)
Ok to refill,  printed rx  

## 2013-05-26 NOTE — Telephone Encounter (Signed)
Rx faxed to pharmacy  

## 2013-06-01 ENCOUNTER — Other Ambulatory Visit: Payer: Self-pay | Admitting: Internal Medicine

## 2013-06-01 MED ORDER — HYDROCODONE-ACETAMINOPHEN 10-325 MG PO TABS: ORAL_TABLET | ORAL | Status: DC

## 2013-06-01 NOTE — Telephone Encounter (Signed)
The patient stated that the pharmacy did not receive her prescription for tramadol  HYDROcodone-acetaminophen (NORCO) 10-325 MG per tablet

## 2013-06-01 NOTE — Telephone Encounter (Signed)
Left message, notifying pt Rx ready for pickup 

## 2013-06-01 NOTE — Telephone Encounter (Signed)
Pt asking if she can also get her hydrocodone refill.  Would like to pick up today.  States she is aware of 10-93 hr refill policy but needs today if at all possible.

## 2013-06-01 NOTE — Telephone Encounter (Signed)
Ok to refill,  printed rx  

## 2013-06-01 NOTE — Telephone Encounter (Signed)
Last appt 04/22/13

## 2013-06-03 ENCOUNTER — Encounter: Payer: Self-pay | Admitting: Adult Health

## 2013-06-03 ENCOUNTER — Ambulatory Visit (INDEPENDENT_AMBULATORY_CARE_PROVIDER_SITE_OTHER): Payer: Medicare Other | Admitting: Adult Health

## 2013-06-03 VITALS — BP 112/62 | HR 63 | Temp 97.9°F | Resp 14 | Ht 65.25 in | Wt 176.2 lb

## 2013-06-03 DIAGNOSIS — I499 Cardiac arrhythmia, unspecified: Secondary | ICD-10-CM

## 2013-06-03 DIAGNOSIS — J019 Acute sinusitis, unspecified: Secondary | ICD-10-CM

## 2013-06-03 MED ORDER — AMOXICILLIN-POT CLAVULANATE 875-125 MG PO TABS: 1.0000 | ORAL_TABLET | Freq: Two times a day (BID) | ORAL | Status: DC

## 2013-06-03 NOTE — Patient Instructions (Addendum)
Start Augmentin 1 tablet twice a day for 10 days. Return to clinic if no improvement within 4-5 days.   Sinusitis Sinusitis is redness, soreness, and puffiness (inflammation) of the air pockets in the bones of your face (sinuses). The redness, soreness, and puffiness can cause air and mucus to get trapped in your sinuses. This can allow germs to grow and cause an infection.  HOME CARE   Drink enough fluids to keep your pee (urine) clear or pale yellow.  Use a humidifier in your home.  Run a hot shower to create steam in the bathroom. Sit in the bathroom with the door closed. Breathe in the steam 3 4 times a day.  Put a warm, moist washcloth on your face 3 4 times a day, or as told by your doctor.  Use salt water sprays (saline sprays) to wet the thick fluid in your nose. This can help the sinuses drain.  Only take medicine as told by your doctor. GET HELP RIGHT AWAY IF:   Your pain gets worse.  You have very bad headaches.  You are sick to your stomach (nauseous).  You throw up (vomit).  You are very sleepy (drowsy) all the time.  Your face is puffy (swollen).  Your vision changes.  You have a stiff neck.  You have trouble breathing. MAKE SURE YOU:   Understand these instructions.  Will watch your condition.  Will get help right away if you are not doing well or get worse. Document Released: 06/12/2007 Document Revised: 09/18/2011 Document Reviewed: 07/30/2011 [facility] Patient Information 2014 [place].

## 2013-06-03 NOTE — Progress Notes (Signed)
Subjective:    Patient ID: Jodi [name], female    DOB: 12/16/1945, 68 y.o.   MRN: [id number]  HPI 68 y/o female presents today for signs of a cold. Indicates it started about 2 weeks ago and has not really gotten better since. Has attempted multiple medications in attempt to relieve symptoms including Tylenol, cold/sinus medicaitons and a netti pot. Indicates she has green/yellow discharge from blowing her nose and coughing up the same. Denies fever chills, or night sweats. today for signs of sinus infection.   Past Medical History  Diagnosis Date  . Diabetes mellitus without complication     diet controlled since gastric bypass  . Arthritis   . Anxiety     Current Outpatient Prescriptions on File Prior to Visit  Medication Sig Dispense Refill  . ALPRAZolam (XANAX) 0.5 MG tablet TAKE 1 TABLET BY MOUTH TWICE A DAY AS NEEDED  60 tablet  2  . amitriptyline (ELAVIL) 25 MG tablet TAKE 1 TABLET BY MOUTH AT BEDTIME AND MAY INCREASE TO 2 TABLETS IF NEEDED AFTER 1 WEEK  60 tablet  2  . buPROPion (WELLBUTRIN SR) 100 MG 12 hr tablet Take 1 tablet (100 mg total) by mouth 2 (two) times daily.  60 tablet  2  . hydrochlorothiazide (HYDRODIURIL) 25 MG tablet TAKE 1 TABLET (25 MG TOTAL) BY MOUTH DAILY.  90 tablet  3  . HYDROcodone-acetaminophen (NORCO) 10-325 MG per tablet TAKE 1 TABLET BY MOUTH EVERY 6 HOURS AS NEEDED FOR PAIN  120 tablet  0  . meloxicam (MOBIC) 15 MG tablet Take 1 tablet (15 mg total) by mouth daily.  90 tablet  2  . omeprazole (PRILOSEC) 20 MG capsule TAKE 1 CAPSULE (20 MG TOTAL) BY MOUTH DAILY.  30 capsule  6  . Tdap (BOOSTRIX) 5-2.5-18.5 LF-MCG/0.5 injection Inject 0.5 mLs into the muscle once.  0.5 mL  0  . traMADol (ULTRAM) 50 MG tablet TAKE 2 TABLETS BY MOUTH EVERY 8 HOURS AS NEEDED FOR PAIN  180 tablet  1  . traZODone (DESYREL) 100 MG tablet Take 100 mg by mouth 2 (two) times daily.       No current facility-administered medications on file prior to visit.     Review of  Systems  Constitutional: Negative for fever and chills.  HENT: Positive for congestion, postnasal drip, rhinorrhea and sinus pressure. Negative for sneezing and sore throat.   Respiratory: Positive for cough. Negative for wheezing.   All other systems reviewed and are negative.      Objective:  BP 112/62  Pulse 63  Temp(Src) 97.9 F (36.6 C) (Oral)  Resp 14  Ht 5' 5.25" (1.657 m)  Wt 176 lb 4 oz (79.946 kg)  BMI 29.12 kg/m2  SpO2 96%   Physical Exam  Constitutional: She is oriented to person, place, and time. She appears well-developed and well-nourished. No distress.  HENT:  Head: Normocephalic and atraumatic.  Right Ear: External ear normal.  Left Ear: External ear normal.  Mouth/Throat: No oropharyngeal exudate.  Mucus membranes pink and moist. Throat is slightly reddened. No postnasal drip. No lymphandopathy or sinus pain.   Cardiovascular: Normal rate and normal heart sounds.  Exam reveals no gallop and no friction rub.   No murmur heard. Irregular rhythm.   Pulmonary/Chest: Effort normal and breath sounds normal. No respiratory distress. She has no wheezes. She has no rales.  Lymphadenopathy:    She has no cervical adenopathy.  Neurological: She is alert and oriented to person, place,  and time.  Psychiatric: She has a normal mood and affect. Her behavior is normal. Judgment and thought content normal.      Assessment & Plan:   1. Acute sinus infection Start augmentin bid x 10 days. Supportive care with irrigation of sinuses Coricidin products for specific symptoms. Advised against decongestants that may cause further irregularity of HR  2. Irregular heart rate Noted during exam. EKG shows sinus rhythm with occasional PACs vs sinus pauses. Asymptomatic. Follow up during next annual exam or sooner if symptomatic. - EKG 12-Lead

## 2013-06-03 NOTE — Progress Notes (Signed)
Pre visit review using our clinic review tool, if applicable. No additional management support is needed unless otherwise documented below in the visit note. 

## 2013-06-04 ENCOUNTER — Other Ambulatory Visit: Payer: Self-pay | Admitting: Internal Medicine

## 2013-06-06 ENCOUNTER — Other Ambulatory Visit: Payer: Self-pay | Admitting: Internal Medicine

## 2013-06-09 ENCOUNTER — Encounter: Payer: Self-pay | Admitting: Internal Medicine

## 2013-07-05 ENCOUNTER — Other Ambulatory Visit: Payer: Self-pay | Admitting: *Deleted

## 2013-07-05 MED ORDER — OMEPRAZOLE 20 MG PO CPDR: DELAYED_RELEASE_CAPSULE | ORAL | Status: DC

## 2013-07-12 ENCOUNTER — Other Ambulatory Visit: Payer: Self-pay | Admitting: Internal Medicine

## 2013-07-12 NOTE — Telephone Encounter (Signed)
Last refill 4.11.15, last OV 5.28.15, no future OV.  Please advise refill.

## 2013-07-12 NOTE — Telephone Encounter (Signed)
Ok to refill,  Refill sent  

## 2013-07-14 ENCOUNTER — Other Ambulatory Visit: Payer: Self-pay | Admitting: Internal Medicine

## 2013-07-15 NOTE — Telephone Encounter (Signed)
Ok to fill 

## 2013-07-15 NOTE — Telephone Encounter (Signed)
Ok to refill,  printed rx  

## 2013-07-22 ENCOUNTER — Telehealth: Payer: Self-pay | Admitting: Internal Medicine

## 2013-07-22 NOTE — Telephone Encounter (Signed)
Patient requesting refill on Hydrocodone ok to fill? Last fill 06/01/13

## 2013-07-22 NOTE — Telephone Encounter (Signed)
Pt called in and stated she needed a refill on her hydrocodone.

## 2013-07-23 MED ORDER — HYDROCODONE-ACETAMINOPHEN 10-325 MG PO TABS: ORAL_TABLET | ORAL | Status: DC

## 2013-07-23 NOTE — Telephone Encounter (Signed)
Patient notified of results.

## 2013-07-23 NOTE — Telephone Encounter (Signed)
Ok to refill,  printed rx  

## 2013-08-18 ENCOUNTER — Other Ambulatory Visit: Payer: Self-pay | Admitting: *Deleted

## 2013-08-18 NOTE — Telephone Encounter (Signed)
Ok refill? 

## 2013-08-25 MED ORDER — TRAMADOL HCL 50 MG PO TABS: ORAL_TABLET | ORAL | Status: DC

## 2013-08-25 NOTE — Telephone Encounter (Signed)
Ok to refill,  printed rx  

## 2013-08-25 NOTE — Telephone Encounter (Signed)
Rx faxed to pharmacy  

## 2013-08-28 ENCOUNTER — Other Ambulatory Visit: Payer: Self-pay | Admitting: Internal Medicine

## 2013-10-04 ENCOUNTER — Other Ambulatory Visit: Payer: Self-pay | Admitting: Internal Medicine

## 2013-10-04 ENCOUNTER — Other Ambulatory Visit: Payer: Self-pay

## 2013-10-04 MED ORDER — HYDROCODONE-ACETAMINOPHEN 10-325 MG PO TABS: ORAL_TABLET | ORAL | Status: DC

## 2013-10-04 NOTE — Telephone Encounter (Signed)
Pt notified Rx ready for pickup 

## 2013-10-04 NOTE — Telephone Encounter (Signed)
The patient called hoping to get a refill of hydrocodone.  Pt's callback - [phone]

## 2013-10-04 NOTE — Progress Notes (Signed)
Pt notified Rx ready for pickup 

## 2013-10-10 ENCOUNTER — Other Ambulatory Visit: Payer: Self-pay | Admitting: Internal Medicine

## 2013-10-11 NOTE — Telephone Encounter (Signed)
Ok to refill,  printed rx  

## 2013-10-11 NOTE — Telephone Encounter (Signed)
Ok refill? 

## 2013-10-11 NOTE — Telephone Encounter (Signed)
Rx faxed

## 2013-11-22 ENCOUNTER — Other Ambulatory Visit: Payer: Self-pay | Admitting: Internal Medicine

## 2013-11-23 NOTE — Telephone Encounter (Signed)
Script for Alprazolam faxed to pharmacy as requested.

## 2013-11-24 ENCOUNTER — Telehealth: Payer: Self-pay

## 2013-11-24 NOTE — Telephone Encounter (Signed)
[facility] and LVM  According to pt, they did her last eye exam.

## 2013-11-25 ENCOUNTER — Encounter: Payer: Self-pay | Admitting: Internal Medicine

## 2013-12-01 ENCOUNTER — Other Ambulatory Visit: Payer: Self-pay | Admitting: *Deleted

## 2013-12-01 NOTE — Telephone Encounter (Signed)
Last visit 06/03/13 acute visit, refill?

## 2013-12-05 MED ORDER — TRAMADOL HCL 50 MG PO TABS: ORAL_TABLET | ORAL | Status: DC

## 2013-12-05 NOTE — Telephone Encounter (Signed)
Refill one 30 days only.  Has not been seen in  6 months so needs office visit prior to any more refills 

## 2013-12-06 ENCOUNTER — Telehealth: Payer: Self-pay

## 2013-12-06 MED ORDER — BUPROPION HCL ER (SR) 100 MG PO TB12: 100.0000 mg | ORAL_TABLET | Freq: Two times a day (BID) | ORAL | Status: DC

## 2013-12-06 NOTE — Telephone Encounter (Signed)
wellbutrin yes,  Hydrocodone  Not without a 6 month follow up . Has patient made an  appt?

## 2013-12-06 NOTE — Telephone Encounter (Signed)
The patient called hoping to get a refill on her hydrocodone and wellbutrin.

## 2013-12-06 NOTE — Telephone Encounter (Signed)
Rx called into to pharmacy

## 2013-12-06 NOTE — Telephone Encounter (Signed)
Last Ov 5.28.15.  Please advise refills

## 2013-12-07 ENCOUNTER — Telehealth: Payer: Self-pay | Admitting: Internal Medicine

## 2013-12-07 MED ORDER — HYDROCODONE-ACETAMINOPHEN 10-325 MG PO TABS: ORAL_TABLET | ORAL | Status: DC

## 2013-12-07 NOTE — Telephone Encounter (Signed)
Spoke to pt, notified on need for appt. Scheduled 01/19/14. Requesting refill on hydrocodone, states she is out of medication

## 2013-12-07 NOTE — Telephone Encounter (Signed)
Pt notified and  verbalized understanding. Placed up front for pickup

## 2013-12-07 NOTE — Telephone Encounter (Signed)
Will refill for 30 days only. She will need to ration until then.  Please remind her of the policy.  Has to be seen a minimum of every 6 months In the future she will need to make her appt before she runs out.  No exceptions

## 2013-12-07 NOTE — Telephone Encounter (Signed)
Pt left msg that she needed a refill on hydocodone. Pt stated that she was completely out of rx. Please advise pt/msn

## 2013-12-07 NOTE — Telephone Encounter (Signed)
This has already been handled, see other encounter.

## 2014-01-04 ENCOUNTER — Other Ambulatory Visit: Payer: Self-pay | Admitting: Internal Medicine

## 2014-01-19 ENCOUNTER — Encounter: Payer: Self-pay | Admitting: Internal Medicine

## 2014-01-19 ENCOUNTER — Ambulatory Visit (INDEPENDENT_AMBULATORY_CARE_PROVIDER_SITE_OTHER): Payer: Medicare Other | Admitting: Internal Medicine

## 2014-01-19 VITALS — BP 118/74 | HR 65 | Temp 97.5°F | Resp 14 | Ht 65.0 in | Wt 167.2 lb

## 2014-01-19 DIAGNOSIS — R0683 Snoring: Secondary | ICD-10-CM

## 2014-01-19 DIAGNOSIS — E669 Obesity, unspecified: Secondary | ICD-10-CM

## 2014-01-19 DIAGNOSIS — B351 Tinea unguium: Secondary | ICD-10-CM | POA: Diagnosis not present

## 2014-01-19 DIAGNOSIS — R7301 Impaired fasting glucose: Secondary | ICD-10-CM

## 2014-01-19 DIAGNOSIS — Z13820 Encounter for screening for osteoporosis: Secondary | ICD-10-CM

## 2014-01-19 DIAGNOSIS — R5383 Other fatigue: Secondary | ICD-10-CM

## 2014-01-19 DIAGNOSIS — Z79899 Other long term (current) drug therapy: Secondary | ICD-10-CM | POA: Diagnosis not present

## 2014-01-19 DIAGNOSIS — M1990 Unspecified osteoarthritis, unspecified site: Secondary | ICD-10-CM

## 2014-01-19 DIAGNOSIS — G4733 Obstructive sleep apnea (adult) (pediatric): Secondary | ICD-10-CM | POA: Diagnosis not present

## 2014-01-19 DIAGNOSIS — Z79891 Long term (current) use of opiate analgesic: Secondary | ICD-10-CM | POA: Diagnosis not present

## 2014-01-19 DIAGNOSIS — Z8639 Personal history of other endocrine, nutritional and metabolic disease: Secondary | ICD-10-CM

## 2014-01-19 DIAGNOSIS — E119 Type 2 diabetes mellitus without complications: Secondary | ICD-10-CM

## 2014-01-19 LAB — HM DIABETES FOOT EXAM: HM Diabetic Foot Exam: NORMAL

## 2014-01-19 MED ORDER — MELOXICAM 15 MG PO TABS
15.0000 mg | ORAL_TABLET | Freq: Every day | ORAL | Status: DC
Start: 2014-01-19 — End: 2014-10-17

## 2014-01-19 MED ORDER — HYDROCODONE-ACETAMINOPHEN 10-325 MG PO TABS: ORAL_TABLET | ORAL | Status: DC

## 2014-01-19 MED ORDER — TRAMADOL HCL 50 MG PO TABS: ORAL_TABLET | ORAL | Status: DC

## 2014-01-19 MED ORDER — HYDROCHLOROTHIAZIDE 25 MG PO TABS: ORAL_TABLET | ORAL | Status: DC

## 2014-01-19 MED ORDER — ALPRAZOLAM 0.5 MG PO TABS: ORAL_TABLET | ORAL | Status: DC

## 2014-01-19 NOTE — Progress Notes (Signed)
Pre-visit discussion using our clinic review tool. No additional management support is needed unless otherwise documented below in the visit note.  

## 2014-01-19 NOTE — Patient Instructions (Signed)
Sleep Apnea  Sleep apnea is a sleep disorder characterized by abnormal pauses in breathing while you sleep. When your breathing pauses, the level of oxygen in your blood decreases. This causes you to move out of deep sleep and into light sleep. As a result, your quality of sleep is poor, and the system that carries your blood throughout your body (cardiovascular system) experiences stress. If sleep apnea remains untreated, the following conditions can develop:  High blood pressure (hypertension).  Coronary artery disease.  Inability to achieve or maintain an erection (impotence).  Impairment of your thought process (cognitive dysfunction). There are three types of sleep apnea: 1. Obstructive sleep apnea--Pauses in breathing during sleep because of a blocked airway. 2. Central sleep apnea--Pauses in breathing during sleep because the area of the brain that controls your breathing does not send the correct signals to the muscles that control breathing. 3. Mixed sleep apnea--A combination of both obstructive and central sleep apnea. RISK FACTORS The following risk factors can increase your risk of developing sleep apnea:  Being overweight.  Smoking.  Having narrow passages in your nose and throat.  Being of older age.  Being female.  Alcohol use.  Sedative and tranquilizer use.  Ethnicity. Among individuals younger than 35 years, African Americans are at increased risk of sleep apnea. SYMPTOMS   Difficulty staying asleep.  Daytime sleepiness and fatigue.  Loss of energy.  Irritability.  Loud, heavy snoring.  Morning headaches.  Trouble concentrating.  Forgetfulness.  Decreased interest in sex. DIAGNOSIS  In order to diagnose sleep apnea, your caregiver will perform a physical examination. Your caregiver may suggest that you take a home sleep test. Your caregiver may also recommend that you spend the night in a sleep lab. In the sleep lab, several monitors record  information about your heart, lungs, and brain while you sleep. Your leg and arm movements and blood oxygen level are also recorded. TREATMENT The following actions may help to resolve mild sleep apnea:  Sleeping on your side.   Using a decongestant if you have nasal congestion.   Avoiding the use of depressants, including alcohol, sedatives, and narcotics.   Losing weight and modifying your diet if you are overweight. There also are devices and treatments to help open your airway:  Oral appliances. These are custom-made mouthpieces that shift your lower jaw forward and slightly open your bite. This opens your airway.  Devices that create positive airway pressure. This positive pressure "splints" your airway open to help you breathe better during sleep. The following devices create positive airway pressure:  Continuous positive airway pressure (CPAP) device. The CPAP device creates a continuous level of air pressure with an air pump. The air is delivered to your airway through a mask while you sleep. This continuous pressure keeps your airway open.  Nasal expiratory positive airway pressure (EPAP) device. The EPAP device creates positive air pressure as you exhale. The device consists of single-use valves, which are inserted into each nostril and held in place by adhesive. The valves create very little resistance when you inhale but create much more resistance when you exhale. That increased resistance creates the positive airway pressure. This positive pressure while you exhale keeps your airway open, making it easier to breath when you inhale again.  Bilevel positive airway pressure (BPAP) device. The BPAP device is used mainly in patients with central sleep apnea. This device is similar to the CPAP device because it also uses an air pump to deliver continuous air pressure   through a mask. However, with the BPAP machine, the pressure is set at two different levels. The pressure when you  exhale is lower than the pressure when you inhale.  Surgery. Typically, surgery is only done if you cannot comply with less invasive treatments or if the less invasive treatments do not improve your condition. Surgery involves removing excess tissue in your airway to create a wider passage way. Document Released: 12/14/2001 Document Revised: 04/20/2012 Document Reviewed: 05/02/2011 Exit[employer] Patient Information 2015 [employer]. This information is not intended to replace advice given to you by your health care provider. Make sure you discuss any questions you have with your health care provider.  

## 2014-01-19 NOTE — Progress Notes (Signed)
Patient ID: Jodi [name], female   DOB: 08/08/1945, 69 y.o.   MRN: [id number]  Patient Active Problem List   Diagnosis Date Noted  . Obesity 01/22/2014  . Snoring 01/19/2014  . Onychomycosis due to dermatophyte 01/19/2014  . OSA (obstructive sleep apnea) 01/19/2014  . Recurrent occipital headache 02/07/2013  . Routine general medical examination at a health care facility 03/09/2012  . New onset of headaches after age 75 12/17/2011  . Diabetes mellitus without complication   . Arthritis   . Anxiety   . History of gastric bypass 10/30/2011  . Personal history of diabetes mellitus 10/30/2011    Subjective:  CC:   Chief Complaint  Patient presents with  . Follow-up    6 month for medication refills    HPI:   Jodi [name] is a 69 y.o. female who presents for  Patient arrived 12 minutes late for 15 minute appt  For med refill  And follow up on chronic conditions including bilateral knee pain, back pain left hip pain due ot OA/DJD., and obesity s/p gastric bypass surgery.  She is accompanied by her mother Jodi [name],  And her granddaughter a 2.5 yr old , whom she cares for during the day.  She tries to avoid lifting her to avoid aggravating her back pain. Her back pain is controlled with vicodin, alternating with tramadol.  Has occasional twinges of back pain but not radiating. She is taking one tramadol 4 times daily.  2 to 3 vicodin  daily  For management left hip pain and bilateral knee pain .  Prior films by [place] and [place]. Right knee replacement was done after failing cortisone therapy ,  Hip replacement left in 2004    Past Medical History  Diagnosis Date  . Diabetes mellitus without complication     diet controlled since gastric bypass  . Arthritis   . Anxiety     Past Surgical History  Procedure Laterality Date  . Bladder suspension    . Cholecystectomy    . Tracheotomy      at age 49 due to respiratroy failure  . Joint replacement      left hip, right knee   . Roux-en-y procedure  2010       The following portions of the patient's history were reviewed and updated as appropriate: Allergies, current medications, and problem list.    Review of Systems:   Patient denies headache, fevers, malaise, unintentional weight loss, skin rash, eye pain, sinus congestion and sinus pain, sore throat, dysphagia,  hemoptysis , cough, dyspnea, wheezing, chest pain, palpitations, orthopnea, edema, abdominal pain, nausea, melena, diarrhea, constipation, flank pain, dysuria, hematuria, urinary  Frequency, nocturia, numbness, tingling, seizures,  Focal weakness, Loss of consciousness,  Tremor, insomnia, depression, anxiety, and suicidal ideation.     History   Social History  . Marital Status: Widowed    Spouse Name: N/A    Number of Children: N/A  . Years of Education: N/A   Occupational History  . Not on file.   Social History Main Topics  . Smoking status: Never Smoker   . Smokeless tobacco: Never Used  . Alcohol Use: No  . Drug Use: No  . Sexual Activity: Not Currently   Other Topics Concern  . Not on file   Social History Narrative    Objective:  Filed Vitals:   01/19/14 1538  BP: 118/74  Pulse: 65  Temp: 97.5 F (36.4 C)  Resp: 14     General  appearance: alert, cooperative and appears stated age Ears: normal TM's and external ear canals both ears Throat: lips, mucosa, and tongue normal; teeth and gums normal Neck: no adenopathy, no carotid bruit, supple, symmetrical, trachea midline and thyroid not enlarged, symmetric, no tenderness/mass/nodules Back: symmetric, no curvature. ROM normal. No CVA tenderness. Lungs: clear to auscultation bilaterally Heart: regular rate and rhythm, S1, S2 normal, no murmur, click, rub or gallop Abdomen: soft, non-tender; bowel sounds normal; no masses,  no organomegaly Pulses: 2+ and symmetric Skin: Skin color, texture, turgor normal. No rashes or lesions Lymph nodes: Cervical, supraclavicular,  and axillary nodes normal. Foot exam:  Nails are well trimmed,  No callouses,  Sensation intact to microfilament   Assessment and Plan:  Diabetes mellitus without complication Her diabetes has been diet controlled since her gastric bypass 3 years ago. She has not had a hemoglobin A1c in over a year. She is also overdue for her annual eye exam.     Obesity S/p gastric bypass nearly 5 years ago, with recent weight gain. .  She has lost the weight needed to get her BMI below 30 by reducing her portion sizes.  Body mass index is 27.83 kg/(m^2).    Personal history of diabetes mellitus She has been normoglycemic since having her gastric bypass surggery.  Foot exam was normal today.  Reminder for annual diabetic eye exam given.  Lab Results  Component Value Date   HGBA1C 5.8 01/19/2014   Lab Results  Component Value Date   MICROALBUR 0.2 01/19/2014        Arthritis Secondary to DJD with prior knee and hip replacements.  She failed therapy with NSAIDs due to inadequate control and incresaed risk of GI vleed,  Continue vicodin and tramadol    Updated Medication List Outpatient Encounter Prescriptions as of 01/19/2014  Medication Sig  . ALPRAZolam (XANAX) 0.5 MG tablet TAKE 1 TABLET BY MOUTH TWICE A DAY AS NEEDED FOR ANXIETY OR INSOMNIA  . amitriptyline (ELAVIL) 25 MG tablet TAKE 1 TABLET BY MOUTH AT BEDTIME AND MAY INCREASE TO 2 TABLETS IF NEEDED AFTER 1 WEEK  . amoxicillin-clavulanate (AUGMENTIN) 875-125 MG per tablet Take 1 tablet by mouth 2 (two) times daily.  Marland Kitchen buPROPion (WELLBUTRIN SR) 100 MG 12 hr tablet Take 1 tablet (100 mg total) by mouth 2 (two) times daily.  Marland Kitchen HYDROcodone-acetaminophen (NORCO) 10-325 MG per tablet TAKE 1 TABLET BY MOUTH EVERY 6 HOURS AS NEEDED FOR PAIN  . omeprazole (PRILOSEC) 20 MG capsule TAKE 1 CAPSULE (20 MG TOTAL) BY MOUTH DAILY.  Marland Kitchen sertraline (ZOLOFT) 100 MG tablet TAKE 1 TABLET BY MOUTH DAILY.  . traZODone (DESYREL) 100 MG tablet Take 100 mg  by mouth 2 (two) times daily.  . [DISCONTINUED] ALPRAZolam (XANAX) 0.5 MG tablet TAKE 1 TABLET BY MOUTH TWICE A DAY AS NEEDED FOR SLEEP  . [DISCONTINUED] hydrochlorothiazide (HYDRODIURIL) 25 MG tablet TAKE 1 TABLET (25 MG TOTAL) BY MOUTH DAILY.  . [DISCONTINUED] HYDROcodone-acetaminophen (NORCO) 10-325 MG per tablet TAKE 1 TABLET BY MOUTH EVERY 6 HOURS AS NEEDED FOR PAIN  . [DISCONTINUED] meloxicam (MOBIC) 15 MG tablet Take 1 tablet (15 mg total) by mouth daily.  . [DISCONTINUED] traMADol (ULTRAM) 50 MG tablet TAKE 2 TABLETS BY MOUTH EVERY 8 HOURS AS NEEDED FOR PAIN  . hydrochlorothiazide (HYDRODIURIL) 25 MG tablet TAKE 1 TABLET (25 MG TOTAL) BY MOUTH DAILY.  . meloxicam (MOBIC) 15 MG tablet Take 1 tablet (15 mg total) by mouth daily.  . Tdap (BOOSTRIX) 5-2.5-18.5 LF-MCG/0.5 injection  Inject 0.5 mLs into the muscle once. (Patient not taking: Reported on 01/19/2014)  . traMADol (ULTRAM) 50 MG tablet TAKE 2 TABLETS BY MOUTH EVERY 8 HOURS AS NEEDED FOR PAIN  . [DISCONTINUED] amitriptyline (ELAVIL) 25 MG tablet TAKE 1 TABLET BY MOUTH AT BEDTIME AND MAY INCREASE TO 2 TABLETS IF NEEDED AFTER 1 WEEK  . [DISCONTINUED] amitriptyline (ELAVIL) 25 MG tablet TAKE 1 TABLET BY MOUTH AT BEDTIME AND MAY INCREASE TO 2 TABLETS IF NEEDED AFTER 1 WEEK     Orders Placed This Encounter  Procedures  . HM MAMMOGRAPHY  . HM MAMMOGRAPHY  . Comprehensive metabolic panel  . Hemoglobin A1c  . Microalbumin / creatinine urine ratio    Return in about 3 months (around 04/20/2014).

## 2014-01-20 ENCOUNTER — Encounter: Payer: Self-pay | Admitting: *Deleted

## 2014-01-20 LAB — COMPREHENSIVE METABOLIC PANEL
ALK PHOS: 69 U/L (ref 39–117)
ALT: 15 U/L (ref 0–35)
AST: 18 U/L (ref 0–37)
Albumin: 4 g/dL (ref 3.5–5.2)
BUN: 20 mg/dL (ref 6–23)
CALCIUM: 9 mg/dL (ref 8.4–10.5)
CHLORIDE: 104 meq/L (ref 96–112)
CO2: 29 mEq/L (ref 19–32)
CREATININE: 1.07 mg/dL (ref 0.40–1.20)
GFR: 54.33 mL/min — AB (ref >60.00)
Glucose, Bld: 102 mg/dL — ABNORMAL HIGH (ref 70–99)
POTASSIUM: 4.3 meq/L (ref 3.5–5.1)
Sodium: 139 mEq/L (ref 135–145)
TOTAL PROTEIN: 6.2 g/dL (ref 6.0–8.3)
Total Bilirubin: 0.3 mg/dL (ref 0.2–1.2)

## 2014-01-20 LAB — HEMOGLOBIN A1C: Hgb A1c MFr Bld: 5.8 % (ref 4.6–6.5)

## 2014-01-20 LAB — MICROALBUMIN / CREATININE URINE RATIO
Creatinine,U: 115.4 mg/dL
MICROALB/CREAT RATIO: 0.2 mg/g (ref 0.0–30.0)
Microalb, Ur: 0.2 mg/dL (ref 0.0–1.9)

## 2014-01-22 ENCOUNTER — Encounter: Payer: Self-pay | Admitting: Internal Medicine

## 2014-01-22 DIAGNOSIS — E119 Type 2 diabetes mellitus without complications: Secondary | ICD-10-CM | POA: Insufficient documentation

## 2014-01-22 DIAGNOSIS — E669 Obesity, unspecified: Secondary | ICD-10-CM | POA: Insufficient documentation

## 2014-01-22 NOTE — Assessment & Plan Note (Signed)
She has been normoglycemic since having her gastric bypass surggery.  Foot exam was normal today.  Reminder for annual diabetic eye exam given.  Lab Results  Component Value Date   HGBA1C 5.8 01/19/2014   Lab Results  Component Value Date   MICROALBUR 0.2 01/19/2014

## 2014-01-22 NOTE — Assessment & Plan Note (Signed)
Secondary to DJD with prior knee and hip replacements.  She failed therapy with NSAIDs due to inadequate control and incresaed risk of GI vleed,  Continue vicodin and tramadol

## 2014-01-22 NOTE — Assessment & Plan Note (Signed)
Her diabetes has been diet controlled since her gastric bypass 3 years ago. She has not had a hemoglobin A1c in over a year. She is also overdue for her annual eye exam.

## 2014-01-22 NOTE — Assessment & Plan Note (Signed)
S/p gastric bypass nearly 5 years ago, with recent weight gain. .  She has lost the weight needed to get her BMI below 30 by reducing her portion sizes.  Body mass index is 27.83 kg/(m^2).

## 2014-02-01 ENCOUNTER — Other Ambulatory Visit: Payer: Self-pay | Admitting: Internal Medicine

## 2014-02-15 ENCOUNTER — Encounter: Payer: Self-pay | Admitting: Internal Medicine

## 2014-02-27 ENCOUNTER — Other Ambulatory Visit: Payer: Self-pay | Admitting: Internal Medicine

## 2014-02-28 NOTE — Telephone Encounter (Signed)
OK to fill

## 2014-03-01 NOTE — Telephone Encounter (Signed)
Medication has been refilled and sent to pharmacy  

## 2014-03-02 ENCOUNTER — Telehealth: Payer: Self-pay | Admitting: Internal Medicine

## 2014-03-02 MED ORDER — TRAMADOL HCL 50 MG PO TABS: ORAL_TABLET | ORAL | Status: DC

## 2014-03-02 NOTE — Telephone Encounter (Signed)
Script faxed.

## 2014-03-02 NOTE — Telephone Encounter (Signed)
Ok to refill,  printed rx .  She uses 180/month

## 2014-03-02 NOTE — Telephone Encounter (Addendum)
[facility] requesting refill on tramadol for patient. Called patient to verify because patient was prescribed 180 tramadol on 01/19/14 ,Patient stated she is out of medication and requesting refill. Please advise?

## 2014-03-08 ENCOUNTER — Other Ambulatory Visit: Payer: Self-pay | Admitting: Internal Medicine

## 2014-03-08 NOTE — Telephone Encounter (Signed)
Patient called requesting refill on Hydrocodone  Last filled 01/19/14 ok to fill> Script Pended.

## 2014-03-09 MED ORDER — HYDROCODONE-ACETAMINOPHEN 10-325 MG PO TABS: ORAL_TABLET | ORAL | Status: DC

## 2014-03-09 NOTE — Telephone Encounter (Signed)
Patient notified and script placed at front desk. 

## 2014-03-09 NOTE — Telephone Encounter (Signed)
Ok to refill,  printed rx  

## 2014-04-05 ENCOUNTER — Other Ambulatory Visit: Payer: Self-pay | Admitting: Internal Medicine

## 2014-04-08 ENCOUNTER — Other Ambulatory Visit: Payer: Self-pay | Admitting: Internal Medicine

## 2014-04-27 ENCOUNTER — Other Ambulatory Visit: Payer: Self-pay | Admitting: *Deleted

## 2014-04-27 MED ORDER — HYDROCODONE-ACETAMINOPHEN 10-325 MG PO TABS: ORAL_TABLET | ORAL | Status: DC

## 2014-04-27 NOTE — Telephone Encounter (Signed)
Pt left VM, requesting refill hydrocodone. Ok refill? Last visit 01/19/14

## 2014-04-27 NOTE — Telephone Encounter (Signed)
Ok to refill,  printed rx  

## 2014-04-28 NOTE — Telephone Encounter (Signed)
Left message, notifying Rx ready for pickup

## 2014-05-05 ENCOUNTER — Telehealth: Payer: Self-pay | Admitting: Internal Medicine

## 2014-05-05 MED ORDER — ALPRAZOLAM 0.5 MG PO TABS: ORAL_TABLET | ORAL | Status: DC

## 2014-05-05 NOTE — Telephone Encounter (Signed)
Rx faxed

## 2014-05-05 NOTE — Telephone Encounter (Signed)
Ok to refill,  printed rx  

## 2014-05-05 NOTE — Telephone Encounter (Signed)
Patient requesting refill on alprazolam ok to fill.

## 2014-05-30 ENCOUNTER — Telehealth: Payer: Self-pay | Admitting: *Deleted

## 2014-05-30 ENCOUNTER — Telehealth: Payer: Self-pay

## 2014-05-30 MED ORDER — HYDROCODONE-ACETAMINOPHEN 10-325 MG PO TABS: ORAL_TABLET | ORAL | Status: DC

## 2014-05-30 NOTE — Telephone Encounter (Signed)
The pt dropped off her handicap form.  Place in [facility]

## 2014-05-30 NOTE — Telephone Encounter (Signed)
Pt called requesting Hydrocodone refill.  Last refill 4.20.16, last OV 1.13.16.  Please advise refill.

## 2014-05-30 NOTE — Telephone Encounter (Signed)
Patient notified and voiced understanding, appointment scheduled and medication ready for pick up.

## 2014-05-30 NOTE — Telephone Encounter (Signed)
Refill for 30 days only.  Will need six month follow up prior to any more refills

## 2014-06-01 ENCOUNTER — Other Ambulatory Visit: Payer: Self-pay | Admitting: Internal Medicine

## 2014-06-03 NOTE — Telephone Encounter (Signed)
Pt left VM, asking of hydrocodone Rx ready for pickup. Called pt back, notified it is ready,  verbalized understanding

## 2014-06-30 ENCOUNTER — Telehealth: Payer: Self-pay | Admitting: Internal Medicine

## 2014-06-30 ENCOUNTER — Encounter: Payer: Self-pay | Admitting: Internal Medicine

## 2014-06-30 ENCOUNTER — Ambulatory Visit (INDEPENDENT_AMBULATORY_CARE_PROVIDER_SITE_OTHER): Payer: Medicare Other | Admitting: Internal Medicine

## 2014-06-30 VITALS — BP 124/70 | HR 69 | Temp 98.2°F | Resp 14 | Ht 65.0 in | Wt 168.5 lb

## 2014-06-30 DIAGNOSIS — Z79899 Other long term (current) drug therapy: Secondary | ICD-10-CM | POA: Diagnosis not present

## 2014-06-30 DIAGNOSIS — Z79891 Long term (current) use of opiate analgesic: Secondary | ICD-10-CM | POA: Diagnosis not present

## 2014-06-30 DIAGNOSIS — Z13820 Encounter for screening for osteoporosis: Secondary | ICD-10-CM

## 2014-06-30 DIAGNOSIS — R5383 Other fatigue: Secondary | ICD-10-CM | POA: Diagnosis not present

## 2014-06-30 DIAGNOSIS — M170 Bilateral primary osteoarthritis of knee: Secondary | ICD-10-CM

## 2014-06-30 DIAGNOSIS — E559 Vitamin D deficiency, unspecified: Secondary | ICD-10-CM | POA: Diagnosis not present

## 2014-06-30 DIAGNOSIS — E119 Type 2 diabetes mellitus without complications: Secondary | ICD-10-CM | POA: Diagnosis not present

## 2014-06-30 DIAGNOSIS — F419 Anxiety disorder, unspecified: Secondary | ICD-10-CM

## 2014-06-30 DIAGNOSIS — Z1239 Encounter for other screening for malignant neoplasm of breast: Secondary | ICD-10-CM

## 2014-06-30 LAB — CBC WITH DIFFERENTIAL/PLATELET
BASOS PCT: 0.6 % (ref 0.0–3.0)
Basophils Absolute: 0 10*3/uL (ref 0.0–0.1)
EOS ABS: 0 10*3/uL (ref 0.0–0.7)
Eosinophils Relative: 0.9 % (ref 0.0–5.0)
HCT: 36.3 % (ref 36.0–46.0)
HEMOGLOBIN: 12 g/dL (ref 12.0–15.0)
LYMPHS ABS: 2.1 10*3/uL (ref 0.7–4.0)
Lymphocytes Relative: 38.4 % (ref 12.0–46.0)
MCHC: 33.1 g/dL (ref 30.0–36.0)
MCV: 88.5 fl (ref 78.0–100.0)
Monocytes Absolute: 0.5 10*3/uL (ref 0.1–1.0)
Monocytes Relative: 8.4 % (ref 3.0–12.0)
NEUTROS ABS: 2.9 10*3/uL (ref 1.4–7.7)
Neutrophils Relative %: 51.7 % (ref 43.0–77.0)
Platelets: 227 10*3/uL (ref 150.0–400.0)
RBC: 4.1 Mil/uL (ref 3.87–5.11)
RDW: 13.8 % (ref 11.5–15.5)
WBC: 5.6 10*3/uL (ref 4.0–10.5)

## 2014-06-30 LAB — LIPID PANEL
CHOLESTEROL: 164 mg/dL (ref 0–200)
HDL: 76.5 mg/dL (ref >39.00)
LDL Cholesterol: 73 mg/dL (ref 0–99)
NonHDL: 87.5
Total CHOL/HDL Ratio: 2
Triglycerides: 75 mg/dL (ref 0.0–149.0)
VLDL: 15 mg/dL (ref 0.0–40.0)

## 2014-06-30 LAB — COMPREHENSIVE METABOLIC PANEL
ALK PHOS: 61 U/L (ref 39–117)
ALT: 13 U/L (ref 0–35)
AST: 17 U/L (ref 0–37)
Albumin: 3.8 g/dL (ref 3.5–5.2)
BILIRUBIN TOTAL: 0.4 mg/dL (ref 0.2–1.2)
BUN: 19 mg/dL (ref 6–23)
CO2: 32 mEq/L (ref 19–32)
CREATININE: 1.14 mg/dL (ref 0.40–1.20)
Calcium: 8.8 mg/dL (ref 8.4–10.5)
Chloride: 105 mEq/L (ref 96–112)
GFR: 50.43 mL/min — AB (ref >60.00)
GLUCOSE: 90 mg/dL (ref 70–99)
Potassium: 3.7 mEq/L (ref 3.5–5.1)
SODIUM: 141 meq/L (ref 135–145)
TOTAL PROTEIN: 5.9 g/dL — AB (ref 6.0–8.3)

## 2014-06-30 LAB — MICROALBUMIN / CREATININE URINE RATIO
Creatinine,U: 180.6 mg/dL
MICROALB/CREAT RATIO: 0.4 mg/g (ref 0.0–30.0)
Microalb, Ur: <0.7 mg/dL (ref 0.0–1.9)

## 2014-06-30 LAB — HEMOGLOBIN A1C: Hgb A1c MFr Bld: 5.4 % (ref 4.6–6.5)

## 2014-06-30 LAB — LDL CHOLESTEROL, DIRECT: Direct LDL: 69 mg/dL

## 2014-06-30 LAB — VITAMIN D 25 HYDROXY (VIT D DEFICIENCY, FRACTURES): VITD: 38.61 ng/mL (ref 30.00–100.00)

## 2014-06-30 MED ORDER — TETANUS-DIPHTH-ACELL PERTUSSIS 5-2.5-18.5 LF-MCG/0.5 IM SUSP: 0.5000 mL | Freq: Once | INTRAMUSCULAR | Status: DC

## 2014-06-30 MED ORDER — SERTRALINE HCL 100 MG PO TABS: 100.0000 mg | ORAL_TABLET | Freq: Every day | ORAL | Status: DC

## 2014-06-30 MED ORDER — AMITRIPTYLINE HCL 25 MG PO TABS: ORAL_TABLET | ORAL | Status: DC

## 2014-06-30 MED ORDER — HYDROCODONE-ACETAMINOPHEN 10-325 MG PO TABS: ORAL_TABLET | ORAL | Status: DC

## 2014-06-30 NOTE — Telephone Encounter (Signed)
Called and advised patient placing script for 07/30/14 medication refill on Hydrocodone at front desk for pick up. Patient voiced understanding.

## 2014-06-30 NOTE — Progress Notes (Signed)
Pre-visit discussion using our clinic review tool. No additional management support is needed unless otherwise documented below in the visit note.  

## 2014-06-30 NOTE — Patient Instructions (Signed)
We are ordering your mammogram and your bone density test  Your still need your tetanus-diptheria-pertussis vaccine (TDaP) but you can get it for less $$$ at a local pharmacy with the script I have provided you.    Please schedule your wellness exam

## 2014-06-30 NOTE — Progress Notes (Signed)
Subjective:  Patient ID: Jodi [name], female    DOB: 08-18-1945  Age: 69 y.o. MRN: [id number]  CC: The primary encounter diagnosis was Diabetes mellitus without complication. Diagnoses of Other fatigue, Vitamin D deficiency, Screening for osteoporosis, Screening for breast cancer, and Anxiety were also pertinent to this visit.  HPI Jodi [name] presents for medication refill and follow up on chronic knee pain secondary to DJD,  Chronic insomnia, and  Obesity s/p gastric bypass  .  She continues to  Maintain a BMI < 30 without exercising  Due to knee pain.  She is using Vicodin 4 daily , along with meloxicam. For managmemtn kf jflammation,   Outpatient Prescriptions Prior to Visit  Medication Sig Dispense Refill  . ALPRAZolam (XANAX) 0.5 MG tablet TAKE 1 TABLET BY MOUTH TWICE A DAY AS NEEDED FOR ANXIETY OR INSOMNIA 60 tablet 2  . buPROPion (WELLBUTRIN SR) 100 MG 12 hr tablet TAKE 1 TABLET (100 MG TOTAL) BY MOUTH 2 (TWO) TIMES DAILY. 60 tablet 2  . hydrochlorothiazide (HYDRODIURIL) 25 MG tablet TAKE 1 TABLET (25 MG TOTAL) BY MOUTH DAILY. 90 tablet 1  . meloxicam (MOBIC) 15 MG tablet Take 1 tablet (15 mg total) by mouth daily. 90 tablet 2  . omeprazole (PRILOSEC) 20 MG capsule TAKE 1 CAPSULE (20 MG TOTAL) BY MOUTH DAILY. 30 capsule 6  . traMADol (ULTRAM) 50 MG tablet TAKE 2 TABLETS BY MOUTH EVERY 8 HOURS AS NEEDED FOR PAIN 180 tablet 3  . traZODone (DESYREL) 100 MG tablet Take 100 mg by mouth 2 (two) times daily.    Mar[place] amitriptyline (ELAVIL) 25 MG tablet TAKE 1 TABLET BY MOUTH AT BEDTIME AND MAY INCREASE TO 2 TABLETS IF NEEDED AFTER 1 WEEK 60 tablet 2  . HYDROcodone-acetaminophen (NORCO) 10-325 MG per tablet TAKE 1 TABLET BY MOUTH EVERY 6 HOURS AS NEEDED FOR PAIN 120 tablet 0  . sertraline (ZOLOFT) 100 MG tablet TAKE 1 TABLET BY MOUTH DAILY. 90 tablet 3  . Tdap (BOOSTRIX) 5-2.5-18.5 LF-MCG/0.5 injection Inject 0.5 mLs into the muscle once. 0.5 mL 0  . amitriptyline (ELAVIL) 25 MG tablet  TAKE 1 TABLET BY MOUTH AT BEDTIME AND MAY INCREASE TO 2 TABLETS IF NEEDED AFTER 1 WEEK 60 tablet 2  . amitriptyline (ELAVIL) 25 MG tablet TAKE 1 TABLET BY MOUTH AT BEDTIME AND MAY INCREASE TO 2 TABLETS IF NEEDED AFTER 1 WEEK 60 tablet 2  . amoxicillin-clavulanate (AUGMENTIN) 875-125 MG per tablet Take 1 tablet by mouth 2 (two) times daily. 20 tablet 0   No facility-administered medications prior to visit.    Review of Systems;  Patient denies headache, fevers, malaise, unintentional weight loss, skin rash, eye pain, sinus congestion and sinus pain, sore throat, dysphagia,  hemoptysis , cough, dyspnea, wheezing, chest pain, palpitations, orthopnea, edema, abdominal pain, nausea, melena, diarrhea, constipation, flank pain, dysuria, hematuria, urinary  Frequency, nocturia, numbness, tingling, seizures,  Focal weakness, Loss of consciousness,  Tremor, insomnia, depression, anxiety, and suicidal ideation.      Objective:  BP 124/70 mmHg  Pulse 69  Temp(Src) 98.2 F (36.8 C) (Oral)  Resp 14  Ht 5\' 5"  (1.651 m)  Wt 168 lb 8 oz (76.431 kg)  BMI 28.04 kg/m2  SpO2 97%  BP Readings from Last 3 Encounters:  06/30/14 124/70  01/19/14 118/74  06/03/13 112/62    Wt Readings from Last 3 Encounters:  06/30/14 168 lb 8 oz (76.431 kg)  01/19/14 167 lb 4 oz (75.864 kg)  06/03/13 176 lb  4 oz (79.946 kg)    General appearance: alert, cooperative and appears stated age Ears: normal TM's and external ear canals both ears Throat: lips, mucosa, and tongue normal; teeth and gums normal Neck: no adenopathy, no carotid bruit, supple, symmetrical, trachea midline and thyroid not enlarged, symmetric, no tenderness/mass/nodules Back: symmetric, no curvature. ROM normal. No CVA tenderness. Lungs: clear to auscultation bilaterally Heart: regular rate and rhythm, S1, S2 normal, no murmur, click, rub or gallop Abdomen: soft, non-tender; bowel sounds normal; no masses,  no organomegaly Pulses: 2+ and  symmetric Skin: Skin color, texture, turgor normal. No rashes or lesions Lymph nodes: Cervical, supraclavicular, and axillary nodes normal.  Lab Results  Component Value Date   HGBA1C 5.4 06/30/2014   HGBA1C 5.8 01/19/2014   HGBA1C 5.4 04/22/2013    Lab Results  Component Value Date   CREATININE 1.14 06/30/2014   CREATININE 1.07 01/19/2014   CREATININE 0.9 04/22/2013    Lab Results  Component Value Date   WBC 5.6 06/30/2014   HGB 12.0 06/30/2014   HCT 36.3 06/30/2014   PLT 227.0 06/30/2014   GLUCOSE 90 06/30/2014   CHOL 164 06/30/2014   TRIG 75.0 06/30/2014   HDL 76.50 06/30/2014   LDLDIRECT 69.0 06/30/2014   LDLCALC 73 06/30/2014   ALT 13 06/30/2014   AST 17 06/30/2014   NA 141 06/30/2014   K 3.7 06/30/2014   CL 105 06/30/2014   CREATININE 1.14 06/30/2014   BUN 19 06/30/2014   CO2 32 06/30/2014   TSH 1.68 12/08/2012   HGBA1C 5.4 06/30/2014   MICROALBUR <0.7 06/30/2014    No results found.  Assessment & Plan:   Problem List Items Addressed This Visit    Diabetes mellitus without complication - Primary     Historically well-controlled on diet alone .  hemoglobin A1c has been consistently at or  less than 7.0 .and was essentially cured when she had her surgery.  Patient is up-to-date on eye exams and foot exam is normal today. Patient has no microalbuminuria. Patient is tolerating statin therapy for CAD risk reduction and on ACE/ARB for renal protection and hypertension   Lab Results  Component Value Date   HGBA1C 6.0 07/07/2013   Lab Results  Component Value Date   MICROALBUR 0.2 07/07/2013         Relevant Orders   Microalbumin / creatinine urine ratio (Completed)   LDL cholesterol, direct (Completed)   Hemoglobin A1c (Completed)   Lipid panel (Completed)   Anxiety    She is tolerating  change in therapy from zoloft to wellbutrin for lack of concentration        Relevant Medications   amitriptyline (ELAVIL) 25 MG tablet   sertraline (ZOLOFT) 100  MG tablet    Other Visit Diagnoses    Other fatigue        Relevant Orders    Comprehensive metabolic panel (Completed)    CBC with Differential/Platelet (Completed)    Vitamin D deficiency        Relevant Orders    Vit D  25 hydroxy (rtn osteoporosis monitoring) (Completed)    Screening for osteoporosis        Relevant Orders    DG Bone Density    Screening for breast cancer        Relevant Orders    MM DIGITAL SCREENING BILATERAL       I have discontinued Ms. Burpee's Tdap and amoxicillin-clavulanate. I have also changed her sertraline. Additionally, I am having her  start on Tdap. Lastly, I am having her maintain her traZODone, hydrochlorothiazide, meloxicam, omeprazole, traMADol, ALPRAZolam, buPROPion, amitriptyline, and HYDROcodone-acetaminophen.  Meds ordered this encounter  Medications  . amitriptyline (ELAVIL) 25 MG tablet    Sig: TAKE 1 TABLET BY MOUTH AT BEDTIME AND MAY INCREASE TO 2 TABLETS IF NEEDED AFTER 1 WEEK    Dispense:  60 tablet    Refill:  2  . sertraline (ZOLOFT) 100 MG tablet    Sig: Take 1 tablet (100 mg total) by mouth daily.    Dispense:  90 tablet    Refill:  3  . DISCONTD: HYDROcodone-acetaminophen (NORCO) 10-325 MG per tablet    Sig: TAKE 1 TABLET BY MOUTH EVERY 6 HOURS AS NEEDED FOR PAIN,    Dispense:  120 tablet    Refill:  0  . Tdap (BOOSTRIX) 5-2.5-18.5 LF-MCG/0.5 injection    Sig: Inject 0.5 mLs into the muscle once.    Dispense:  0.5 mL    Refill:  0  . DISCONTD: HYDROcodone-acetaminophen (NORCO) 10-325 MG per tablet    Sig: TAKE 1 TABLET BY MOUTH EVERY 6 HOURS AS NEEDED FOR PAIN,    Dispense:  120 tablet    Refill:  0    May refill on or after July 30 2014  . DISCONTD: HYDROcodone-acetaminophen (NORCO) 10-325 MG per tablet    Sig: TAKE 1 TABLET BY MOUTH EVERY 6 HOURS AS NEEDED FOR PAIN,    Dispense:  120 tablet    Refill:  0    May refill on or after July 30 2014  . HYDROcodone-acetaminophen (NORCO) 10-325 MG per tablet    Sig: TAKE  1 TABLET BY MOUTH EVERY 6 HOURS AS NEEDED FOR PAIN,    Dispense:  120 tablet    Refill:  0    Medications Discontinued During This Encounter  Medication Reason  . amoxicillin-clavulanate (AUGMENTIN) 875-125 MG per tablet Completed Course  . amitriptyline (ELAVIL) 25 MG tablet Duplicate  . amitriptyline (ELAVIL) 25 MG tablet Duplicate  . Tdap (BOOSTRIX) 5-2.5-18.5 LF-MCG/0.5 injection Patient Preference  . amitriptyline (ELAVIL) 25 MG tablet Reorder  . sertraline (ZOLOFT) 100 MG tablet Reorder  . HYDROcodone-acetaminophen (NORCO) 10-325 MG per tablet Reorder  . HYDROcodone-acetaminophen (NORCO) 10-325 MG per tablet Reorder  . HYDROcodone-acetaminophen (NORCO) 10-325 MG per tablet Reorder  . HYDROcodone-acetaminophen (NORCO) 10-325 MG per tablet Reorder    Follow-up: Return in about 3 months (around 09/30/2014).   [name], MD

## 2014-07-03 DIAGNOSIS — M179 Osteoarthritis of knee, unspecified: Secondary | ICD-10-CM | POA: Insufficient documentation

## 2014-07-03 DIAGNOSIS — M1710 Unilateral primary osteoarthritis, unspecified knee: Secondary | ICD-10-CM | POA: Insufficient documentation

## 2014-07-03 NOTE — Assessment & Plan Note (Signed)
With history of TKR and continued pain.  I have refilled her vicodin. The risks and benefits of narcotics use were discussed with patient today including excessive sedation leading to respiratory depression,  impaired thinking/driving, and addiction.  Patient was advised to avoid concurrent use with alcohol, to use medication only as needed and not to share with others  .

## 2014-07-03 NOTE — Assessment & Plan Note (Signed)
Historically well-controlled on diet alone .  hemoglobin A1c has been consistently at or  less than 7.0 .and was essentially cured when she had her surgery.  Patient is up-to-date on eye exams and foot exam is normal today. Patient has no microalbuminuria. Patient is tolerating statin therapy for CAD risk reduction and on ACE/ARB for renal protection and hypertension   Lab Results  Component Value Date   HGBA1C 6.0 07/07/2013   Lab Results  Component Value Date   MICROALBUR 0.2 07/07/2013

## 2014-07-03 NOTE — Assessment & Plan Note (Signed)
She is tolerating  change in therapy from zoloft to wellbutrin for lack of concentration

## 2014-07-04 ENCOUNTER — Other Ambulatory Visit: Payer: Self-pay | Admitting: Internal Medicine

## 2014-07-04 ENCOUNTER — Encounter: Payer: Self-pay | Admitting: *Deleted

## 2014-07-20 ENCOUNTER — Telehealth: Payer: Self-pay

## 2014-07-20 NOTE — Telephone Encounter (Signed)
Followed up with [facility].  Requests originally made with [facility] and they were in the process of contacting her for the appointment.  However, patient has decided to now be seen at [facility].  I informed her this would delay the process of obtaining another appointment.  Per patient request [facility] has been cancelled. Referral coordinator is now requesting imaging and appointments be made at [facility].

## 2014-07-25 ENCOUNTER — Ambulatory Visit
Admission: RE | Admit: 2014-07-25 | Discharge: 2014-07-25 | Disposition: A | Discharge status: Home / Self Care | Payer: Medicare Other | Source: Ambulatory Visit | Attending: Internal Medicine | Admitting: Internal Medicine

## 2014-07-25 DIAGNOSIS — Z1239 Encounter for other screening for malignant neoplasm of breast: Secondary | ICD-10-CM

## 2014-07-25 DIAGNOSIS — M898X8 Other specified disorders of bone, other site: Secondary | ICD-10-CM | POA: Diagnosis not present

## 2014-07-25 DIAGNOSIS — Z13820 Encounter for screening for osteoporosis: Secondary | ICD-10-CM | POA: Diagnosis not present

## 2014-07-25 DIAGNOSIS — Z780 Asymptomatic menopausal state: Secondary | ICD-10-CM | POA: Diagnosis not present

## 2014-07-25 DIAGNOSIS — M8580 Other specified disorders of bone density and structure, unspecified site: Secondary | ICD-10-CM | POA: Insufficient documentation

## 2014-07-25 DIAGNOSIS — Z1231 Encounter for screening mammogram for malignant neoplasm of breast: Secondary | ICD-10-CM | POA: Diagnosis not present

## 2014-07-27 ENCOUNTER — Encounter: Payer: Self-pay | Admitting: Internal Medicine

## 2014-08-05 ENCOUNTER — Telehealth: Payer: Self-pay | Admitting: *Deleted

## 2014-08-05 MED ORDER — ALPRAZOLAM 0.5 MG PO TABS: ORAL_TABLET | ORAL | Status: DC

## 2014-08-05 NOTE — Telephone Encounter (Signed)
Rx called in 

## 2014-08-05 NOTE — Telephone Encounter (Signed)
Ok to refill,  Authorized in epic 

## 2014-08-05 NOTE — Telephone Encounter (Signed)
Fax from pharmacy requesting Alprazolam.  Last refill [phone], last OV 6.23.16.  Please advise refill

## 2014-08-16 ENCOUNTER — Other Ambulatory Visit: Payer: Self-pay | Admitting: Internal Medicine

## 2014-08-30 ENCOUNTER — Telehealth: Payer: Self-pay | Admitting: Internal Medicine

## 2014-08-30 ENCOUNTER — Other Ambulatory Visit: Payer: Self-pay | Admitting: Internal Medicine

## 2014-08-30 MED ORDER — HYDROCODONE-ACETAMINOPHEN 10-325 MG PO TABS: ORAL_TABLET | ORAL | Status: DC

## 2014-08-30 MED ORDER — TRAMADOL HCL 50 MG PO TABS: ORAL_TABLET | ORAL | Status: DC

## 2014-08-30 NOTE — Telephone Encounter (Signed)
Placed in quick sign. 

## 2014-08-30 NOTE — Telephone Encounter (Signed)
Last OV 6.23.16.  Please advise refill

## 2014-08-30 NOTE — Telephone Encounter (Signed)
Patient called requesting refill on tramadol ,hydrocodone  ok to fill? Last fill on tramadol 2/16 ad hydrocodone was 06/30/14.

## 2014-08-30 NOTE — Telephone Encounter (Signed)
Ok to refill both,  Caution her that the tramadol maximum amount is 4 daily.

## 2014-08-31 NOTE — Telephone Encounter (Signed)
Ok to refill,  Refill sent  

## 2014-08-31 NOTE — Telephone Encounter (Signed)
Pt aware Rx ready for pick up 

## 2014-10-07 ENCOUNTER — Other Ambulatory Visit: Payer: Self-pay | Admitting: Internal Medicine

## 2014-10-17 ENCOUNTER — Other Ambulatory Visit: Payer: Self-pay | Admitting: Internal Medicine

## 2014-11-17 ENCOUNTER — Telehealth: Payer: Self-pay | Admitting: Internal Medicine

## 2014-11-17 MED ORDER — HYDROCODONE-ACETAMINOPHEN 10-325 MG PO TABS: ORAL_TABLET | ORAL | Status: DC

## 2014-11-17 NOTE — Telephone Encounter (Signed)
Ok to refill,  printed rx  

## 2014-11-17 NOTE — Telephone Encounter (Signed)
Pt notified to pick up rx.

## 2014-11-17 NOTE — Telephone Encounter (Signed)
Pt called about needing her HYDROcodone-acetaminophen ([place]) 10-325 MG per tablet medication refilled. Pt would like a call when it's ready. Thank You!

## 2014-11-18 NOTE — Telephone Encounter (Signed)
Prescription upfront for pick up

## 2014-12-19 ENCOUNTER — Telehealth: Payer: Self-pay | Admitting: *Deleted

## 2014-12-19 NOTE — Telephone Encounter (Signed)
Do not fill,  Did not come from me

## 2014-12-19 NOTE — Telephone Encounter (Signed)
Thanks no problem.  Refused.

## 2014-12-19 NOTE — Telephone Encounter (Signed)
Please advise, I don't see lidocaine ointment on her medication list.  Thanks

## 2014-12-19 NOTE — Telephone Encounter (Signed)
[place] requested a medication authorization for lidocaine ointment. Please advise Patient referral number is [phone]

## 2014-12-23 ENCOUNTER — Other Ambulatory Visit: Payer: Self-pay

## 2014-12-26 ENCOUNTER — Other Ambulatory Visit: Payer: Self-pay | Admitting: Internal Medicine

## 2014-12-27 ENCOUNTER — Telehealth: Payer: Self-pay | Admitting: *Deleted

## 2014-12-27 NOTE — Telephone Encounter (Signed)
[facility]

## 2015-01-05 NOTE — Telephone Encounter (Signed)
[facility] called again, about prescription for Lidocaine ointment, not prescribed by Dr. Derrel Nip, Refused.

## 2015-02-08 ENCOUNTER — Other Ambulatory Visit: Payer: Self-pay

## 2015-02-08 NOTE — Telephone Encounter (Signed)
Pt last OV 06/30/14, last filled 11/17/14 #[id number] with 0refills. Please advise

## 2015-02-08 NOTE — Telephone Encounter (Signed)
Refill denied.  She is overdue for follow up , no exceptions on narcotics refills.

## 2015-02-10 ENCOUNTER — Ambulatory Visit (INDEPENDENT_AMBULATORY_CARE_PROVIDER_SITE_OTHER): Payer: Medicare Other | Admitting: Internal Medicine

## 2015-02-10 VITALS — BP 118/78 | HR 49 | Temp 98.0°F | Resp 12 | Ht 65.0 in | Wt 182.0 lb

## 2015-02-10 DIAGNOSIS — F4321 Adjustment disorder with depressed mood: Secondary | ICD-10-CM

## 2015-02-10 DIAGNOSIS — Z729 Problem related to lifestyle, unspecified: Secondary | ICD-10-CM | POA: Diagnosis not present

## 2015-02-10 DIAGNOSIS — E559 Vitamin D deficiency, unspecified: Secondary | ICD-10-CM | POA: Diagnosis not present

## 2015-02-10 DIAGNOSIS — Z23 Encounter for immunization: Secondary | ICD-10-CM | POA: Diagnosis not present

## 2015-02-10 DIAGNOSIS — E119 Type 2 diabetes mellitus without complications: Secondary | ICD-10-CM | POA: Diagnosis not present

## 2015-02-10 DIAGNOSIS — Z8639 Personal history of other endocrine, nutritional and metabolic disease: Secondary | ICD-10-CM

## 2015-02-10 DIAGNOSIS — E669 Obesity, unspecified: Secondary | ICD-10-CM

## 2015-02-10 DIAGNOSIS — R5383 Other fatigue: Secondary | ICD-10-CM | POA: Diagnosis not present

## 2015-02-10 DIAGNOSIS — F4320 Adjustment disorder, unspecified: Secondary | ICD-10-CM

## 2015-02-10 LAB — COMPREHENSIVE METABOLIC PANEL
ALK PHOS: 74 U/L (ref 39–117)
ALT: 11 U/L (ref 0–35)
AST: 14 U/L (ref 0–37)
Albumin: 4 g/dL (ref 3.5–5.2)
BUN: 14 mg/dL (ref 6–23)
CHLORIDE: 102 meq/L (ref 96–112)
CO2: 33 meq/L — AB (ref 19–32)
Calcium: 9.2 mg/dL (ref 8.4–10.5)
Creatinine, Ser: 0.98 mg/dL (ref 0.40–1.20)
GFR: 59.93 mL/min — ABNORMAL LOW (ref >60.00)
GLUCOSE: 80 mg/dL (ref 70–99)
POTASSIUM: 3.9 meq/L (ref 3.5–5.1)
Sodium: 142 mEq/L (ref 135–145)
TOTAL PROTEIN: 6 g/dL (ref 6.0–8.3)
Total Bilirubin: 0.5 mg/dL (ref 0.2–1.2)

## 2015-02-10 LAB — LDL CHOLESTEROL, DIRECT: Direct LDL: 84 mg/dL

## 2015-02-10 LAB — VITAMIN D 25 HYDROXY (VIT D DEFICIENCY, FRACTURES): VITD: 35.01 ng/mL (ref 30.00–100.00)

## 2015-02-10 LAB — LIPID PANEL
CHOL/HDL RATIO: 2
CHOLESTEROL: 177 mg/dL (ref 0–200)
HDL: 78.6 mg/dL (ref >39.00)
LDL Cholesterol: 79 mg/dL (ref 0–99)
NONHDL: 98.46
TRIGLYCERIDES: 99 mg/dL (ref 0.0–149.0)
VLDL: 19.8 mg/dL (ref 0.0–40.0)

## 2015-02-10 LAB — CBC WITH DIFFERENTIAL/PLATELET
BASOS PCT: 0.6 % (ref 0.0–3.0)
Basophils Absolute: 0 10*3/uL (ref 0.0–0.1)
EOS PCT: 1 % (ref 0.0–5.0)
Eosinophils Absolute: 0.1 10*3/uL (ref 0.0–0.7)
HCT: 38 % (ref 36.0–46.0)
Hemoglobin: 12.5 g/dL (ref 12.0–15.0)
LYMPHS ABS: 1.9 10*3/uL (ref 0.7–4.0)
Lymphocytes Relative: 34.5 % (ref 12.0–46.0)
MCHC: 32.8 g/dL (ref 30.0–36.0)
MCV: 87.8 fl (ref 78.0–100.0)
MONO ABS: 0.5 10*3/uL (ref 0.1–1.0)
MONOS PCT: 9.2 % (ref 3.0–12.0)
NEUTROS PCT: 54.7 % (ref 43.0–77.0)
Neutro Abs: 3 10*3/uL (ref 1.4–7.7)
PLATELETS: 245 10*3/uL (ref 150.0–400.0)
RBC: 4.34 Mil/uL (ref 3.87–5.11)
RDW: 14 % (ref 11.5–15.5)
WBC: 5.5 10*3/uL (ref 4.0–10.5)

## 2015-02-10 LAB — MICROALBUMIN / CREATININE URINE RATIO
Creatinine,U: 239.6 mg/dL
Microalb Creat Ratio: 0.5 mg/g (ref 0.0–30.0)
Microalb, Ur: 1.3 mg/dL (ref 0.0–1.9)

## 2015-02-10 LAB — HEMOGLOBIN A1C: HEMOGLOBIN A1C: 5.8 % (ref 4.6–6.5)

## 2015-02-10 MED ORDER — TETANUS-DIPHTH-ACELL PERTUSSIS 5-2.5-18.5 LF-MCG/0.5 IM SUSP: 0.5000 mL | Freq: Once | INTRAMUSCULAR | Status: DC

## 2015-02-10 MED ORDER — HYDROCODONE-ACETAMINOPHEN 10-325 MG PO TABS: ORAL_TABLET | ORAL | Status: DC

## 2015-02-10 NOTE — Progress Notes (Signed)
Subjective:  Patient ID: Jodi [name], female    DOB: May 02, 1945  Age: 70 y.o. MRN: [id number]  CC: The primary encounter diagnosis was Encounter for immunization. Diagnoses of Diabetes mellitus without complication ([place]), Vitamin D deficiency, Other fatigue, Problem related to lifestyle, Need for prophylactic vaccination against Streptococcus pneumoniae (pneumococcus), Grief reaction, Obesity, and Personal history of diabetes mellitus were also pertinent to this visit.  HPI [name] presents for follow up on chronic issues,    Grief .  Her mother Jodi [name]  died in 2022-10-12  of lung CA Ca.  Holidays were difficult for her. .. Children supportive.  troubel with sleep maintenance aggravated by 59 yr old granddaughter sleeping with her .    Having more pain in right wrist.  Has OA thumb ,  Has lost strength, ,  Pain is sharp and stabbing at times.    Obesity: has gained 14 lbs since last visit,  BMI is now 30. Has some home exercise equipment but  Has not been using it due to taking care of dying mother and now her 8 yr old granddaughter   Outpatient Prescriptions Prior to Visit  Medication Sig Dispense Refill  . ALPRAZolam (XANAX) 0.5 MG tablet TAKE 1 TABLET BY MOUTH TWICE A DAY AS NEEDED FOR ANXIETY OR INSOMNIA 60 tablet 5  . amitriptyline (ELAVIL) 25 MG tablet TAKE 1 TABLET BY MOUTH AT BEDTIME AND MAY INCREASE TO 2 TABLETS IF NEEDED AFTER 1 WEEK 60 tablet 2  . amitriptyline (ELAVIL) 25 MG tablet TAKE 1 TABLET BY MOUTH AT BEDTIME AND MAY INCREASE TO 2 TABLETS IF NEEDED AFTER 1 WEEK 60 tablet 2  . buPROPion (WELLBUTRIN SR) 100 MG 12 hr tablet TAKE 1 TABLET (100 MG TOTAL) BY MOUTH 2 (TWO) TIMES DAILY. 60 tablet 2  . hydrochlorothiazide (HYDRODIURIL) 25 MG tablet TAKE 1 TABLET (25 MG TOTAL) BY MOUTH DAILY. 90 tablet 1  . meloxicam (MOBIC) 15 MG tablet TAKE 1 TABLET (15 MG TOTAL) BY MOUTH DAILY. 90 tablet 2  . omeprazole (PRILOSEC) 20 MG capsule TAKE 1 CAPSULE (20 MG TOTAL) BY MOUTH  DAILY. 30 capsule 6  . sertraline (ZOLOFT) 100 MG tablet Take 1 tablet (100 mg total) by mouth daily. 90 tablet 3  . traMADol (ULTRAM) 50 MG tablet TAKE 2 TABLETS BY MOUTH EVERY 8 HOURS AS NEEDED FOR PAIN 180 tablet 3  . traZODone (DESYREL) 100 MG tablet Take 100 mg by mouth 2 (two) times daily.    [place] HYDROcodone-acetaminophen (NORCO) 10-325 MG tablet TAKE 1 TABLET BY MOUTH EVERY 6 HOURS AS NEEDED FOR PAIN, 120 tablet 0  . Tdap (BOOSTRIX) 5-2.5-18.5 LF-MCG/0.5 injection Inject 0.5 mLs into the muscle once. 0.5 mL 0   No facility-administered medications prior to visit.    Review of Systems;  Patient denies headache, fevers, malaise, unintentional weight loss, skin rash, eye pain, sinus congestion and sinus pain, sore throat, dysphagia,  hemoptysis , cough, dyspnea, wheezing, chest pain, palpitations, orthopnea, edema, abdominal pain, nausea, melena, diarrhea, constipation, flank pain, dysuria, hematuria, urinary  Frequency, nocturia, numbness, tingling, seizures,  Focal weakness, Loss of consciousness,  Tremor, insomnia, depression, anxiety, and suicidal ideation.      Objective:  BP 118/78 mmHg  Pulse 49  Temp(Src) 98 F (36.7 C) (Oral)  Resp 12  Ht 5\' 5"  (1.651 m)  Wt 182 lb (82.555 kg)  BMI 30.29 kg/m2  SpO2 97%  BP Readings from Last 3 Encounters:  02/10/15 118/78  06/30/14 124/70  01/19/14  118/74    Wt Readings from Last 3 Encounters:  02/10/15 182 lb (82.555 kg)  06/30/14 168 lb 8 oz (76.431 kg)  01/19/14 167 lb 4 oz (75.864 kg)    General appearance: alert, cooperative and appears stated age Ears: normal TM's and external ear canals both ears Throat: lips, mucosa, and tongue normal; teeth and gums normal Neck: no adenopathy, no carotid bruit, supple, symmetrical, trachea midline and thyroid not enlarged, symmetric, no tenderness/mass/nodules Back: symmetric, no curvature. ROM normal. No CVA tenderness. Lungs: clear to auscultation bilaterally Heart: regular rate and  rhythm, S1, S2 normal, no murmur, click, rub or gallop Abdomen: soft, non-tender; bowel sounds normal; no masses,  no organomegaly Pulses: 2+ and symmetric Skin: Skin color, texture, turgor normal. No rashes or lesions Lymph nodes: Cervical, supraclavicular, and axillary nodes normal.  Lab Results  Component Value Date   HGBA1C 5.8 02/10/2015   HGBA1C 5.4 06/30/2014   HGBA1C 5.8 01/19/2014    Lab Results  Component Value Date   CREATININE 0.98 02/10/2015   CREATININE 1.14 06/30/2014   CREATININE 1.07 01/19/2014    Lab Results  Component Value Date   WBC 5.5 02/10/2015   HGB 12.5 02/10/2015   HCT 38.0 02/10/2015   PLT 245.0 02/10/2015   GLUCOSE 80 02/10/2015   CHOL 177 02/10/2015   TRIG 99.0 02/10/2015   HDL 78.60 02/10/2015   LDLDIRECT 84.0 02/10/2015   LDLCALC 79 02/10/2015   ALT 11 02/10/2015   AST 14 02/10/2015   NA 142 02/10/2015   K 3.9 02/10/2015   CL 102 02/10/2015   CREATININE 0.98 02/10/2015   BUN 14 02/10/2015   CO2 33* 02/10/2015   TSH 1.68 12/08/2012   HGBA1C 5.8 02/10/2015   MICROALBUR 1.3 02/10/2015    Dg Bone Density  07/25/2014  EXAM: DUAL X-RAY ABSORPTIOMETRY (DXA) FOR BONE MINERAL DENSITY IMPRESSION: Dear Dr. Derrel Nip, Your patient Jodi [name] completed a BMD test on 07/25/2014 using the [place] (analysis version: 14.10) manufactured by [employer]. The following summarizes the results of our evaluation. PATIENT BIOGRAPHICAL: Name: Jodi, [name] Patient ID: [id number] Birth Date: 12-22-1945 Height: 64.5 in. Gender: Female Exam Date: 07/25/2014 Weight: 173.0 lbs. Indications: Caucasian, left hip replacement, Postmenopausal Fractures: Treatments: Multi-Vitamin with calcium, omeprazole ASSESSMENT: The BMD measured at AP Spine L1-L4 is 0.939 g/cm2 with a T-score of -2.1. This patient is considered osteopenic according to [place] [facility]) criteria. Left Femur was excluded due to surgical hardware. Site Region Measured Measured  WHO Young Adult BMD Date       Age      Classification T-score AP Spine L1-L4 07/25/2014 67.7 Osteopenia -2.1 0.939 g/cm2 Right Femur Neck 07/25/2014 67.7 Osteopenia -1.7 0.801 g/cm2 World Health Organization [facility]) criteria for post-menopausal, Caucasian Women: Normal:       T-score at or above -1 SD Osteopenia:   T-score between -1 and -2.5 SD Osteoporosis: T-score at or below -2.5 SD RECOMMENDATIONS: [place] recommends that FDA-approved medical therapies be considered in postmenopausal women and men age 101 or older with a: 1. Hip or vertebral (clinical or morphometric) fracture. 2. T-score of < -2.5 at the spine or hip. 3. Ten-year fracture probability by FRAX of 3% or greater for hip fracture or 20% or greater for major osteoporotic fracture. All treatment decisions require clinical judgment and consideration of individual patient factors, including patient preferences, co-morbidities, previous drug use, risk factors not captured in the FRAX model (e.g. falls, vitamin D deficiency, increased bone  turnover, interval significant decline in bone density) and possible under - or over-estimation of fracture risk by FRAX. All patients should ensure an adequate intake of dietary calcium (1200 mg/d) and vitamin D (800 IU daily) unless contraindicated. FOLLOW-UP: People with diagnosed cases of osteoporosis or at high risk for fracture should have regular bone mineral density tests. For patients eligible for Medicare, routine testing is allowed once every 2 years. The testing frequency can be increased to one year for patients who have rapidly progressing disease, those who are receiving or discontinuing medical therapy to restore bone mass, or have additional risk factors. I have reviewed this report, and agree with the above findings. [facility] Dear Dr. Derrel Nip, Your patient LASHAWNE ROWLEE completed a FRAX assessment on 07/25/2014 using the [place] (analysis version: 14.10)  manufactured by [employer]. The following summarizes the results of our evaluation. PATIENT BIOGRAPHICAL: Name: Tanzy, Stumbaugh Patient ID: [id number] Birth Date: 23-Jan-1945 Height:    64.5 in. Gender:     Female    [age]       Weight:    173.0 lbs. Ethnicity:  White                            Exam Date: 07/25/2014 FRAX* RESULTS:  (version: 3.5) 10-year Probability of Fracture1 Major Osteoporotic Fracture2 Hip Fracture 10.0% 1.4% Population: [place] (Caucasian) Risk Factors: None Based on Femur (Right) Neck BMD 1 -The 10-year probability of fracture may be lower than reported if the patient has received treatment. 2 -Major Osteoporotic Fracture: Clinical Spine, Forearm, Hip or Shoulder *FRAX is a Materials engineer of the [employer] for Metabolic Bone Disease, a [place] (WHO) [facility]. ASSESSMENT: The probability of a major osteoporotic fracture is 10.0% within the next ten years. The probability of a hip fracture is 1.4% within the next ten years. . Electronically Signed   By: David  Martinique M.D.   On: 07/25/2014 11:37   Mm Digital Screening Bilateral  07/27/2014  CLINICAL DATA:  Screening. EXAM: DIGITAL SCREENING BILATERAL MAMMOGRAM WITH CAD COMPARISON:  Previous exam(s). ACR Breast Density Category b: There are scattered areas of fibroglandular density. FINDINGS: There are no findings suspicious for malignancy. Images were processed with CAD. IMPRESSION: No mammographic evidence of malignancy. A result letter of this screening mammogram will be mailed directly to the patient. RECOMMENDATION: Screening mammogram in one year. (Code:SM-B-01Y) BI-RADS CATEGORY  1: Negative. Electronically Signed   By: Skipper Cliche M.D.   On: 07/27/2014 08:51    Assessment & Plan:   Problem List Items Addressed This Visit    Personal history of diabetes mellitus    Cured by gastric bypass and loss of wight.  a1c now in the prediabetic range. counselling  given.       Obesity    S/p gastric bypass nearly 5 years ago, with  continued weight gain. .  She now has a BMI above 30  I have addressed  BMI and recommended wt loss of 10% of body weigh over the next 6 months using a low glycemic index diet and regular exercise a minimum of 5 days per week.  b      Grief reaction    Patient is dealing with the loss of mother  and has adequate coping skills and emotional support .        Diabetes mellitus without  complication (H[facility])   Relevant Orders   Hemoglobin A1c (Completed)   LDL cholesterol, direct (Completed)   Lipid panel (Completed)   Microalbumin / creatinine urine ratio (Completed)   Comprehensive metabolic panel (Completed)    Other Visit Diagnoses    Encounter for immunization    -  Primary    Vitamin D deficiency        Relevant Orders    VITAMIN D 25 Hydroxy (Vit-D Deficiency, Fractures) (Completed)    Other fatigue        Relevant Orders    CBC with Differential/Platelet (Completed)    Problem related to lifestyle        Relevant Orders    Hepatitis C antibody (Completed)    Need for prophylactic vaccination against Streptococcus pneumoniae (pneumococcus)        Relevant Orders    Pneumococcal polysaccharide vaccine 23-valent greater than or equal to 2yo subcutaneous/IM (Completed)      A total of 25 minutes of face to face time was spent with patient more than half of which was spent in counselling and coordination of care   I am having Ms. Dejonge maintain her traZODone, sertraline, ALPRAZolam, omeprazole, traMADol, amitriptyline, meloxicam, amitriptyline, hydrochlorothiazide, buPROPion, amoxicillin, Tdap, and HYDROcodone-acetaminophen.  Meds ordered this encounter  Medications  . amoxicillin (AMOXIL) 500 MG capsule    Sig: Take 4 capsules by mouth daily.  . Tdap (BOOSTRIX) 5-2.5-18.5 LF-MCG/0.5 injection    Sig: Inject 0.5 mLs into the muscle once.    Dispense:  0.5 mL    Refill:  0  . DISCONTD:  HYDROcodone-acetaminophen (NORCO) 10-325 MG tablet    Sig: TAKE 1 TABLET BY MOUTH EVERY 6 HOURS AS NEEDED FOR PAIN,    Dispense:  120 tablet    Refill:  0  . DISCONTD: HYDROcodone-acetaminophen (NORCO) 10-325 MG tablet    Sig: TAKE 1 TABLET BY MOUTH EVERY 6 HOURS AS NEEDED FOR PAIN,    Dispense:  120 tablet    Refill:  0    May refill on or after March 10 2015  . HYDROcodone-acetaminophen (NORCO) 10-325 MG tablet    Sig: TAKE 1 TABLET BY MOUTH EVERY 6 HOURS AS NEEDED FOR PAIN,    Dispense:  120 tablet    Refill:  0    May refill on or after April 10 2015    Medications Discontinued During This Encounter  Medication Reason  . Tdap (BOOSTRIX) 5-2.5-18.5 LF-MCG/0.5 injection Reorder  . HYDROcodone-acetaminophen (NORCO) 10-325 MG tablet Reorder  . HYDROcodone-acetaminophen (NORCO) 10-325 MG tablet Reorder  . HYDROcodone-acetaminophen (NORCO) 10-325 MG tablet Reorder    Follow-up: Return in about 3 months (around 05/10/2015) for follow up diabetes.   [name], MD

## 2015-02-10 NOTE — Patient Instructions (Addendum)
You have gained 15 lbs since your last visit and your BMI is now 30.  Please resume a regular exerciise program.  You need to do something for 30 minutes to benefit from the workout,  And your goal is 5 days per week  Resume a low fat,  Low carb diet as well  Return in 3 months for your annual exam   I strongly recommend that you get the TDaP vaccine at [facility] .  It is good for ten years for protection against tetanus,  And for lifetime protection against  diphtheria and whooping cough.

## 2015-02-10 NOTE — Progress Notes (Signed)
Cologuard ordered

## 2015-02-10 NOTE — Progress Notes (Signed)
Pre-visit discussion using our clinic review tool. No additional management support is needed unless otherwise documented below in the visit note.  

## 2015-02-11 LAB — HEPATITIS C ANTIBODY: HCV Ab: NEGATIVE

## 2015-02-12 ENCOUNTER — Encounter: Payer: Self-pay | Admitting: Internal Medicine

## 2015-02-12 DIAGNOSIS — F4320 Adjustment disorder, unspecified: Secondary | ICD-10-CM | POA: Insufficient documentation

## 2015-02-12 DIAGNOSIS — F4321 Adjustment disorder with depressed mood: Secondary | ICD-10-CM | POA: Insufficient documentation

## 2015-02-12 NOTE — Assessment & Plan Note (Signed)
Patient is dealing with the loss of mother  and has adequate coping skills and emotional support .

## 2015-02-12 NOTE — Assessment & Plan Note (Signed)
Cured by gastric bypass and loss of wight.  a1c now in the prediabetic range. counselling given.

## 2015-02-12 NOTE — Assessment & Plan Note (Signed)
S/p gastric bypass nearly 5 years ago, with  continued weight gain. .  She now has a BMI above 30  I have addressed  BMI and recommended wt loss of 10% of body weigh over the next 6 months using a low glycemic index diet and regular exercise a minimum of 5 days per week.  b

## 2015-02-13 ENCOUNTER — Encounter: Payer: Self-pay | Admitting: *Deleted

## 2015-02-14 ENCOUNTER — Encounter: Payer: Self-pay | Admitting: *Deleted

## 2015-02-20 ENCOUNTER — Other Ambulatory Visit: Payer: Self-pay

## 2015-02-20 NOTE — Telephone Encounter (Signed)
Please advise refill.  Thanks 

## 2015-02-21 DIAGNOSIS — H524 Presbyopia: Secondary | ICD-10-CM | POA: Diagnosis not present

## 2015-02-21 MED ORDER — ALPRAZOLAM 0.5 MG PO TABS: ORAL_TABLET | ORAL | Status: DC

## 2015-02-21 NOTE — Telephone Encounter (Signed)
Ok to refill,  printed rx  

## 2015-02-27 DIAGNOSIS — Z1211 Encounter for screening for malignant neoplasm of colon: Secondary | ICD-10-CM | POA: Diagnosis not present

## 2015-02-27 DIAGNOSIS — Z1212 Encounter for screening for malignant neoplasm of rectum: Secondary | ICD-10-CM | POA: Diagnosis not present

## 2015-03-09 ENCOUNTER — Telehealth: Payer: Self-pay | Admitting: Internal Medicine

## 2015-03-09 LAB — COLOGUARD

## 2015-03-09 NOTE — Telephone Encounter (Signed)
The results of patient's cologuard is negative.   We will repeat annually  For colon CA screening.  

## 2015-03-10 LAB — COLOGUARD: Cologuard: NEGATIVE

## 2015-03-10 NOTE — Telephone Encounter (Signed)
Notified patient of cologuard results. Patient verbalized understanding

## 2015-03-16 ENCOUNTER — Encounter: Payer: Self-pay | Admitting: Internal Medicine

## 2015-03-17 ENCOUNTER — Other Ambulatory Visit: Payer: Self-pay | Admitting: Internal Medicine

## 2015-03-20 ENCOUNTER — Other Ambulatory Visit: Payer: Self-pay | Admitting: Internal Medicine

## 2015-04-03 ENCOUNTER — Other Ambulatory Visit: Payer: Self-pay

## 2015-04-03 NOTE — Telephone Encounter (Signed)
Pharmacy is requesting a refill on tramadol. Pt's last refill was 08/30/14, last OV 02/10/15. Please advise, thanks

## 2015-04-04 MED ORDER — TRAMADOL HCL 50 MG PO TABS: ORAL_TABLET | ORAL | Status: DC

## 2015-04-04 NOTE — Telephone Encounter (Signed)
Refilled

## 2015-04-26 ENCOUNTER — Encounter: Payer: Self-pay | Admitting: Internal Medicine

## 2015-04-26 ENCOUNTER — Ambulatory Visit (INDEPENDENT_AMBULATORY_CARE_PROVIDER_SITE_OTHER): Payer: Medicare Other | Admitting: Internal Medicine

## 2015-04-26 VITALS — BP 146/72 | HR 56 | Temp 97.6°F | Resp 12 | Ht 65.0 in | Wt 179.5 lb

## 2015-04-26 DIAGNOSIS — Z7289 Other problems related to lifestyle: Secondary | ICD-10-CM

## 2015-04-26 DIAGNOSIS — R51 Headache: Secondary | ICD-10-CM | POA: Diagnosis not present

## 2015-04-26 DIAGNOSIS — Z9889 Other specified postprocedural states: Secondary | ICD-10-CM

## 2015-04-26 DIAGNOSIS — R519 Headache, unspecified: Secondary | ICD-10-CM

## 2015-04-26 DIAGNOSIS — Z1239 Encounter for other screening for malignant neoplasm of breast: Secondary | ICD-10-CM

## 2015-04-26 DIAGNOSIS — E785 Hyperlipidemia, unspecified: Secondary | ICD-10-CM

## 2015-04-26 DIAGNOSIS — G4733 Obstructive sleep apnea (adult) (pediatric): Secondary | ICD-10-CM

## 2015-04-26 DIAGNOSIS — Z79899 Other long term (current) drug therapy: Secondary | ICD-10-CM | POA: Diagnosis not present

## 2015-04-26 DIAGNOSIS — E119 Type 2 diabetes mellitus without complications: Secondary | ICD-10-CM

## 2015-04-26 DIAGNOSIS — E559 Vitamin D deficiency, unspecified: Secondary | ICD-10-CM

## 2015-04-26 DIAGNOSIS — Z0000 Encounter for general adult medical examination without abnormal findings: Secondary | ICD-10-CM | POA: Diagnosis not present

## 2015-04-26 DIAGNOSIS — Z79891 Long term (current) use of opiate analgesic: Secondary | ICD-10-CM | POA: Diagnosis not present

## 2015-04-26 DIAGNOSIS — Z9884 Bariatric surgery status: Secondary | ICD-10-CM

## 2015-04-26 DIAGNOSIS — F4321 Adjustment disorder with depressed mood: Secondary | ICD-10-CM

## 2015-04-26 DIAGNOSIS — M170 Bilateral primary osteoarthritis of knee: Secondary | ICD-10-CM

## 2015-04-26 MED ORDER — HYDROCODONE-ACETAMINOPHEN 10-325 MG PO TABS: ORAL_TABLET | ORAL | Status: DC

## 2015-04-26 NOTE — Progress Notes (Signed)
Patient ID: Jodi [name], female    DOB: [birth year]  Age: 70 y.o. MRN: [id number]  The patient is here for annual Medicare wellness examination and management of other chronic and acute problems.   Mammogram is due July 2017. Has FH of breast CA ([facility] , sister and daughter)  Colonoscopy was due last year , did cologuard instead in  2017 negative DEXA July 2016:  T scores -2.1 spine, -1.7 right femur  Mother had ovarian CA   The risk factors are reflected in the social history.  The roster of all physicians providing medical care to patient - is listed in the Snapshot section of the chart.  Activities of daily living:  The patient is 100% independent in all ADLs: dressing, toileting, feeding as well as independent mobility  Home safety : The patient has smoke detectors in the home. They wear seatbelts.  There are no firearms at home. There is no violence in the home.   There is no risks for hepatitis, STDs or HIV. There is no   history of blood transfusion. They have no travel history to infectious disease endemic areas of the world.  The patient has seen their dentist in the last six month. They have seen their eye doctor in the last year. They admit to slight hearing difficulty with regard to whispered voices and some television programs.  They have deferred audiologic testing in the last year.  They do not  have excessive sun exposure. Discussed the need for sun protection: hats, long sleeves and use of sunscreen if there is significant sun exposure.   Diet: the importance of a healthy diet is discussed. They do have a healthy diet.  The benefits of regular aerobic exercise were discussed. She walks 4 times per week ,  20 minutes.   Depression screen: there are no signs or vegative symptoms of depression- irritability, change in appetite, anhedonia, sadness/tearfullness.  Cognitive assessment: the patient manages all their financial and personal affairs and is actively engaged. They  could relate day,date,year and events; recalled 2/3 objects at 3 minutes; performed clock-face test normally.  The following portions of the patient's history were reviewed and updated as appropriate: allergies, current medications, past family history, past medical history,  past surgical history, past social history  and problem list.  Visual acuity was not assessed per patient preference since she has regular follow up with her ophthalmologist. Hearing and body mass index were assessed and reviewed.   During the course of the visit the patient was educated and counseled about appropriate screening and preventive services including : fall prevention , diabetes screening, nutrition counseling, colorectal cancer screening, and recommended immunizations.    CC: The primary encounter diagnosis was Diabetes mellitus without complication ([place]). Diagnoses of New onset of headaches after age 18, History of gastric bypass, Other problems related to lifestyle, Vitamin D deficiency, Hyperlipidemia, Medicare annual wellness visit, subsequent, Breast cancer screening, Primary osteoarthritis of both knees, Grief reaction, and OSA (obstructive sleep apnea) were also pertinent to this visit.  Increased stress . Her mother in law Jodi [name] died of metastatic lung CA 6 months ago  And the family has been divided and arguinig about her estate.   She has lost 3 lbs since last visit.    History Jodi has a past medical history of Diabetes mellitus without complication ([place]); Arthritis; and Anxiety.   She has past surgical history that includes Bladder suspension; Cholecystectomy; tracheotomy; Joint replacement; and Roux-en-y procedure (2010).   Her  family history includes Breast cancer in her daughter, paternal aunt, and paternal grandmother; COPD in her mother; Hypertension in her mother; Ovarian cancer (age of onset: 38) in her mother.She reports that she has never smoked. She has never used smokeless  tobacco. She reports that she does not drink alcohol or use illicit drugs.  Outpatient Prescriptions Prior to Visit  Medication Sig Dispense Refill  . ALPRAZolam (XANAX) 0.5 MG tablet TAKE 1 TABLET BY MOUTH TWICE A DAY AS NEEDED FOR ANXIETY OR INSOMNIA 60 tablet 5  . amitriptyline (ELAVIL) 25 MG tablet TAKE 1 TABLET BY MOUTH AT BEDTIME AND MAY INCREASE TO 2 TABLETS IF NEEDED AFTER 1 WEEK 60 tablet 2  . amoxicillin (AMOXIL) 500 MG capsule Take 4 capsules by mouth daily.    . hydrochlorothiazide (HYDRODIURIL) 25 MG tablet TAKE 1 TABLET (25 MG TOTAL) BY MOUTH DAILY. 90 tablet 1  . meloxicam (MOBIC) 15 MG tablet TAKE 1 TABLET (15 MG TOTAL) BY MOUTH DAILY. 90 tablet 2  . omeprazole (PRILOSEC) 20 MG capsule TAKE 1 CAPSULE (20 MG TOTAL) BY MOUTH DAILY. 30 capsule 6  . sertraline (ZOLOFT) 100 MG tablet Take 1 tablet (100 mg total) by mouth daily. 90 tablet 3  . Tdap (BOOSTRIX) 5-2.5-18.5 LF-MCG/0.5 injection Inject 0.5 mLs into the muscle once. 0.5 mL 0  . traMADol (ULTRAM) 50 MG tablet TAKE 2 TABLETS BY MOUTH EVERY 8 HOURS AS NEEDED FOR PAIN 180 tablet 3  . traZODone (DESYREL) 100 MG tablet Take 100 mg by mouth 2 (two) times daily.    Marland Kitchen buPROPion (WELLBUTRIN SR) 100 MG 12 hr tablet TAKE 1 TABLET (100 MG TOTAL) BY MOUTH 2 (TWO) TIMES DAILY. 60 tablet 2  . HYDROcodone-acetaminophen (NORCO) 10-325 MG tablet TAKE 1 TABLET BY MOUTH EVERY 6 HOURS AS NEEDED FOR PAIN, 120 tablet 0  . amitriptyline (ELAVIL) 25 MG tablet TAKE 1 TABLET BY MOUTH AT BEDTIME AND MAY INCREASE TO 2 TABLETS IF NEEDED AFTER 1 WEEK 60 tablet 2   No facility-administered medications prior to visit.    Review of Systems   Patient denies headache, fevers, malaise, unintentional weight loss, skin rash, eye pain, sinus congestion and sinus pain, sore throat, dysphagia,  hemoptysis , cough, dyspnea, wheezing, chest pain, palpitations, orthopnea, edema, abdominal pain, nausea, melena, diarrhea, constipation, flank pain, dysuria, hematuria,  urinary  Frequency, nocturia, numbness, tingling, seizures,  Focal weakness, Loss of consciousness,  Tremor, insomnia, depression, anxiety, and suicidal ideation.      Objective:  BP 146/72 mmHg  Pulse 56  Temp(Src) 97.6 F (36.4 C) (Oral)  Resp 12  Ht '5\' 5"'  (1.651 m)  Wt 179 lb 8 oz (81.421 kg)  BMI 29.87 kg/m2  SpO2 98%  Physical Exam   General appearance: alert, cooperative and appears stated age Head: Normocephalic, without obvious abnormality, atraumatic Eyes: conjunctivae/corneas clear. PERRL, EOM's intact. Fundi benign. Ears: normal TM's and external ear canals both ears Nose: Nares normal. Septum midline. Mucosa normal. No drainage or sinus tenderness. Throat: lips, mucosa, and tongue normal; teeth and gums normal Neck: no adenopathy, no carotid bruit, no JVD, supple, symmetrical, trachea midline and thyroid not enlarged, symmetric, no tenderness/mass/nodules Lungs: clear to auscultation bilaterally Breasts: normal appearance, no masses or tenderness Heart: regular rate and rhythm, S1, S2 normal, no murmur, click, rub or gallop Abdomen: soft, non-tender; bowel sounds normal; no masses,  no organomegaly Extremities: extremities normal, atraumatic, no cyanosis or edema Pulses: 2+ and symmetric Skin: Skin color, texture, turgor normal. No rashes or lesions  Neurologic: Alert and oriented X 3, normal strength and tone. Normal symmetric reflexes. Normal coordination and gait.  Foot exam:  Nails are well trimmed,  No callouses,  Sensation intact to microfilament     Assessment & Plan:   Problem List Items Addressed This Visit    Diabetes mellitus without complication ([place]) - Primary     Historically well-controlled on diet alone since her gastric bypass  In 2011.  hemoglobin A1c has been consistently at or  less than 6.0 .  Patient is NOT Nup-to-date on eye exams and foot exam is normal today. Patient has no microalbuminuria. Patient is tolerating statin therapy for CAD risk  reduction and on ACE/ARB for renal protection and hypertension  Lab Results  Component Value Date   HGBA1C 5.8 02/10/2015   Lab Results  Component Value Date   MICROALBUR 1.3 02/10/2015           Relevant Orders   Comprehensive metabolic panel   Lipid panel   Microalbumin / creatinine urine ratio   Hemoglobin A1c   RESOLVED: New onset of headaches after age 60   Relevant Medications   HYDROcodone-acetaminophen (NORCO) 10-325 MG tablet   OSA (obstructive sleep apnea)    DDX included vasculitis but ESR was normal.  Improved  with discontinuation of oxybutynin and use of low dose elavil qhs .  Cervical spine films noted degenerative changes and mild disk space narrowing at C4-c5 level. She also has untreated sleep apnea due to cost of equipment.        DJD (degenerative joint disease) of knee    With history of TKR and continued pain.  I have refilled her vicodin. The risks and benefits of narcotics use were reviewed with patient today including excessive sedation leading to respiratory depression,  impaired thinking/driving, and addiction.  Patient was advised to avoid concurrent use with alcohol, to use medication only as needed and not to share with others  .          Relevant Medications   HYDROcodone-acetaminophen (NORCO) 10-325 MG tablet   Grief reaction    Patient is dealing with the loss of mother  and has adequate coping skills and emotional support .          Medicare annual wellness visit, subsequent    Annual Medicare wellness  exam was done as well as a comprehensive physical exam and management of acute and chronic conditions .  During the course of the visit the patient was educated and counseled about appropriate screening and preventive services including : fall prevention , diabetes screening, nutrition counseling, colorectal cancer screening, and recommended immunizations.  Printed recommendations for health maintenance screenings was given.       History  of gastric bypass    Other Visit Diagnoses    Other problems related to lifestyle        Relevant Orders    HIV antibody    Vitamin D deficiency        Relevant Orders    VITAMIN D 25 Hydroxy (Vit-D Deficiency, Fractures)    Hyperlipidemia        Relevant Orders    LDL cholesterol, direct    TSH    Breast cancer screening        Relevant Orders    MM DIGITAL SCREENING BILATERAL       I am having Ms. Bechtol maintain her traZODone, sertraline, amitriptyline, meloxicam, hydrochlorothiazide, amoxicillin, Tdap, ALPRAZolam, omeprazole, traMADol, and HYDROcodone-acetaminophen.  Meds ordered this encounter  Medications  . DISCONTD: HYDROcodone-acetaminophen (NORCO) 10-325 MG tablet    Sig: TAKE 1 TABLET BY MOUTH EVERY 6 HOURS AS NEEDED FOR PAIN,    Dispense:  120 tablet    Refill:  0    May refill on or after May 10 2015  . DISCONTD: HYDROcodone-acetaminophen (NORCO) 10-325 MG tablet    Sig: TAKE 1 TABLET BY MOUTH EVERY 6 HOURS AS NEEDED FOR PAIN,    Dispense:  120 tablet    Refill:  0    May refill on or after June 10 2015  . HYDROcodone-acetaminophen (NORCO) 10-325 MG tablet    Sig: TAKE 1 TABLET BY MOUTH EVERY 6 HOURS AS NEEDED FOR PAIN,    Dispense:  120 tablet    Refill:  0    May refill on or after July 10 2015    Medications Discontinued During This Encounter  Medication Reason  . amitriptyline (ELAVIL) 25 MG tablet Duplicate  . HYDROcodone-acetaminophen (NORCO) 10-325 MG tablet Reorder  . HYDROcodone-acetaminophen (NORCO) 10-325 MG tablet Reorder  . HYDROcodone-acetaminophen (NORCO) 10-325 MG tablet Reorder    Follow-up: Return in about 3 months (around 07/26/2015) for follow up diabetes\.   [name], MD

## 2015-04-26 NOTE — Patient Instructions (Addendum)
You are not due for labs until after May 23rd  Please make an appointment on or after 5/23 for labs and follow up with me in 3 months  Mammogram will be ordered for annual screening in July  Vicodin refills for May Juen and July were printed and given.    Menopause is a normal process in which your reproductive ability comes to an end. This process happens gradually over a span of months to years, usually between the ages of 86 and 87. Menopause is complete when you have missed 12 consecutive menstrual periods. It is important to talk with your health care provider about some of the most common conditions that affect postmenopausal women, such as heart disease, cancer, and bone loss (osteoporosis). Adopting a healthy lifestyle and getting preventive care can help to promote your health and wellness. Those actions can also lower your chances of developing some of these common conditions. WHAT SHOULD I KNOW ABOUT MENOPAUSE? During menopause, you may experience a number of symptoms, such as:  Moderate-to-severe hot flashes.  Night sweats.  Decrease in sex drive.  Mood swings.  Headaches.  Tiredness.  Irritability.  Memory problems.  Insomnia. Choosing to treat or not to treat menopausal changes is an individual decision that you make with your health care provider. WHAT SHOULD I KNOW ABOUT HORMONE REPLACEMENT THERAPY AND SUPPLEMENTS? Hormone therapy products are effective for treating symptoms that are associated with menopause, such as hot flashes and night sweats. Hormone replacement carries certain risks, especially as you become older. If you are thinking about using estrogen or estrogen with progestin treatments, discuss the benefits and risks with your health care provider. WHAT SHOULD I KNOW ABOUT HEART DISEASE AND STROKE? Heart disease, heart attack, and stroke become more likely as you age. This may be due, in part, to the hormonal changes that your body experiences during  menopause. These can affect how your body processes dietary fats, triglycerides, and cholesterol. Heart attack and stroke are both medical emergencies. There are many things that you can do to help prevent heart disease and stroke:  Have your blood pressure checked at least every 1-2 years. High blood pressure causes heart disease and increases the risk of stroke.  If you are 41-56 years old, ask your health care provider if you should take aspirin to prevent a heart attack or a stroke.  Do not use any tobacco products, including cigarettes, chewing tobacco, or electronic cigarettes. If you need help quitting, ask your health care provider.  It is important to eat a healthy diet and maintain a healthy weight.  Be sure to include plenty of vegetables, fruits, low-fat dairy products, and lean protein.  Avoid eating foods that are high in solid fats, added sugars, or salt (sodium).  Get regular exercise. This is one of the most important things that you can do for your health.  Try to exercise for at least 150 minutes each week. The type of exercise that you do should increase your heart rate and make you sweat. This is known as moderate-intensity exercise.  Try to do strengthening exercises at least twice each week. Do these in addition to the moderate-intensity exercise.  Know your numbers.Ask your health care provider to check your cholesterol and your blood glucose. Continue to have your blood tested as directed by your health care provider. WHAT SHOULD I KNOW ABOUT CANCER SCREENING? There are several types of cancer. Take the following steps to reduce your risk and to catch any cancer development  as early as possible. Breast Cancer  Practice breast self-awareness.  This means understanding how your breasts normally appear and feel.  It also means doing regular breast self-exams. Let your health care provider know about any changes, no matter how small.  If you are 51 or older, have  a clinician do a breast exam (clinical breast exam or CBE) every year. Depending on your age, family history, and medical history, it may be recommended that you also have a yearly breast X-ray (mammogram).  If you have a family history of breast cancer, talk with your health care provider about genetic screening.  If you are at high risk for breast cancer, talk with your health care provider about having an MRI and a mammogram every year.  Breast cancer (BRCA) gene test is recommended for women who have family members with BRCA-related cancers. Results of the assessment will determine the need for genetic counseling and BRCA1 and for BRCA2 testing. BRCA-related cancers include these types:  Breast. This occurs in males or females.  Ovarian.  Tubal. This may also be called fallopian tube cancer.  Cancer of the abdominal or pelvic lining (peritoneal cancer).  Prostate.  Pancreatic. Cervical, Uterine, and Ovarian Cancer Your health care provider may recommend that you be screened regularly for cancer of the pelvic organs. These include your ovaries, uterus, and vagina. This screening involves a pelvic exam, which includes checking for microscopic changes to the surface of your cervix (Pap test).  For women ages 21-65, health care providers may recommend a pelvic exam and a Pap test every three years. For women ages 96-65, they may recommend the Pap test and pelvic exam, combined with testing for human papilloma virus (HPV), every five years. Some types of HPV increase your risk of cervical cancer. Testing for HPV may also be done on women of any age who have unclear Pap test results.  Other health care providers may not recommend any screening for nonpregnant women who are considered low risk for pelvic cancer and have no symptoms. Ask your health care provider if a screening pelvic exam is right for you.  If you have had past treatment for cervical cancer or a condition that could lead to  cancer, you need Pap tests and screening for cancer for at least 20 years after your treatment. If Pap tests have been discontinued for you, your risk factors (such as having a new sexual partner) need to be reassessed to determine if you should start having screenings again. Some women have medical problems that increase the chance of getting cervical cancer. In these cases, your health care provider may recommend that you have screening and Pap tests more often.  If you have a family history of uterine cancer or ovarian cancer, talk with your health care provider about genetic screening.  If you have vaginal bleeding after reaching menopause, tell your health care provider.  There are currently no reliable tests available to screen for ovarian cancer. Lung Cancer Lung cancer screening is recommended for adults 68-32 years old who are at high risk for lung cancer because of a history of smoking. A yearly low-dose CT scan of the lungs is recommended if you:  Currently smoke.  Have a history of at least 30 pack-years of smoking and you currently smoke or have quit within the past 15 years. A pack-year is smoking an average of one pack of cigarettes per day for one year. Yearly screening should:  Continue until it has been 15 years  since you quit.  Stop if you develop a health problem that would prevent you from having lung cancer treatment. Colorectal Cancer  This type of cancer can be detected and can often be prevented.  Routine colorectal cancer screening usually begins at age 25 and continues through age 84.  If you have risk factors for colon cancer, your health care provider may recommend that you be screened at an earlier age.  If you have a family history of colorectal cancer, talk with your health care provider about genetic screening.  Your health care provider may also recommend using home test kits to check for hidden blood in your stool.  A small camera at the end of a tube  can be used to examine your colon directly (sigmoidoscopy or colonoscopy). This is done to check for the earliest forms of colorectal cancer.  Direct examination of the colon should be repeated every 5-10 years until age 86. However, if early forms of precancerous polyps or small growths are found or if you have a family history or genetic risk for colorectal cancer, you may need to be screened more often. Skin Cancer  Check your skin from head to toe regularly.  Monitor any moles. Be sure to tell your health care provider:  About any new moles or changes in moles, especially if there is a change in a mole's shape or color.  If you have a mole that is larger than the size of a pencil eraser.  If any of your family members has a history of skin cancer, especially at a young age, talk with your health care provider about genetic screening.  Always use sunscreen. Apply sunscreen liberally and repeatedly throughout the day.  Whenever you are outside, protect yourself by wearing long sleeves, pants, a wide-brimmed hat, and sunglasses. WHAT SHOULD I KNOW ABOUT OSTEOPOROSIS? Osteoporosis is a condition in which bone destruction happens more quickly than new bone creation. After menopause, you may be at an increased risk for osteoporosis. To help prevent osteoporosis or the bone fractures that can happen because of osteoporosis, the following is recommended:  If you are 32-24 years old, get at least 1,000 mg of calcium and at least 600 mg of vitamin D per day.  If you are older than age 4 but younger than age 11, get at least 1,200 mg of calcium and at least 600 mg of vitamin D per day.  If you are older than age 46, get at least 1,200 mg of calcium and at least 800 mg of vitamin D per day. Smoking and excessive alcohol intake increase the risk of osteoporosis. Eat foods that are rich in calcium and vitamin D, and do weight-bearing exercises several times each week as directed by your health care  provider. WHAT SHOULD I KNOW ABOUT HOW MENOPAUSE AFFECTS [place]? Depression may occur at any age, but it is more common as you become older. Common symptoms of depression include:  Low or sad mood.  Changes in sleep patterns.  Changes in appetite or eating patterns.  Feeling an overall lack of motivation or enjoyment of activities that you previously enjoyed.  Frequent crying spells. Talk with your health care provider if you think that you are experiencing depression. WHAT SHOULD I KNOW ABOUT IMMUNIZATIONS? It is important that you get and maintain your immunizations. These include:  Tetanus, diphtheria, and pertussis (Tdap) booster vaccine.  Influenza every year before the flu season begins.  Pneumonia vaccine.  Shingles vaccine. Your health care  provider may also recommend other immunizations.   This information is not intended to replace advice given to you by your health care provider. Make sure you discuss any questions you have with your health care provider.   Document Released: 02/15/2005 Document Revised: 01/14/2014 Document Reviewed: 08/26/2013 Elsevier Interactive Patient Education [employer].

## 2015-04-26 NOTE — Progress Notes (Signed)
Pre-visit discussion using our clinic review tool. No additional management support is needed unless otherwise documented below in the visit note.  

## 2015-04-27 ENCOUNTER — Other Ambulatory Visit: Payer: Self-pay | Admitting: Internal Medicine

## 2015-04-29 DIAGNOSIS — Z0000 Encounter for general adult medical examination without abnormal findings: Secondary | ICD-10-CM | POA: Insufficient documentation

## 2015-04-29 NOTE — Assessment & Plan Note (Signed)
Patient is dealing with the loss of mother  and has adequate coping skills and emotional support .

## 2015-04-29 NOTE — Assessment & Plan Note (Addendum)
Annual comprehensive preventive exam was done as well as an evaluation and management of chronic conditions .  During the course of the visit the patient was educated and counseled about appropriate screening and preventive services including :  , nutrition counseling, breast, cervical and colorectal cancer screening, and recommended immunizations.  Printed recommendations for health maintenance screenings was give

## 2015-04-29 NOTE — Assessment & Plan Note (Signed)
With history of TKR and continued pain.  I have refilled her vicodin. The risks and benefits of narcotics use were reviewed with patient today including excessive sedation leading to respiratory depression,  impaired thinking/driving, and addiction.  Patient was advised to avoid concurrent use with alcohol, to use medication only as needed and not to share with others  .

## 2015-04-29 NOTE — Assessment & Plan Note (Addendum)
Historically well-controlled on diet alone since her gastric bypass  In 2011.  hemoglobin A1c has been consistently at or  less than 6.0 .  Patient is NOT Nup-to-date on eye exams and foot exam is normal today. Patient has no microalbuminuria. Patient is tolerating statin therapy for CAD risk reduction and on ACE/ARB for renal protection and hypertension  Lab Results  Component Value Date   HGBA1C 5.8 02/10/2015   Lab Results  Component Value Date   MICROALBUR 1.3 02/10/2015

## 2015-04-29 NOTE — Assessment & Plan Note (Addendum)
DDX included vasculitis but ESR was normal.  Improved  with discontinuation of oxybutynin and use of low dose elavil qhs .  Cervical spine films noted degenerative changes and mild disk space narrowing at C4-c5 level. She also has untreated sleep apnea due to cost of equipment.

## 2015-05-11 ENCOUNTER — Ambulatory Visit: Payer: Medicare Other | Admitting: Internal Medicine

## 2015-05-23 ENCOUNTER — Encounter: Payer: Self-pay | Admitting: Internal Medicine

## 2015-05-30 ENCOUNTER — Other Ambulatory Visit: Payer: Medicare Other

## 2015-06-08 ENCOUNTER — Ambulatory Visit: Payer: Medicare Other | Admitting: Internal Medicine

## 2015-06-08 DIAGNOSIS — Z0289 Encounter for other administrative examinations: Secondary | ICD-10-CM

## 2015-06-25 ENCOUNTER — Other Ambulatory Visit: Payer: Self-pay | Admitting: Internal Medicine

## 2015-07-14 ENCOUNTER — Other Ambulatory Visit: Payer: Self-pay | Admitting: Internal Medicine

## 2015-07-22 ENCOUNTER — Other Ambulatory Visit: Payer: Self-pay | Admitting: Internal Medicine

## 2015-07-26 ENCOUNTER — Ambulatory Visit (INDEPENDENT_AMBULATORY_CARE_PROVIDER_SITE_OTHER): Payer: Medicare Other

## 2015-07-26 ENCOUNTER — Encounter: Payer: Self-pay | Admitting: Internal Medicine

## 2015-07-26 ENCOUNTER — Ambulatory Visit (INDEPENDENT_AMBULATORY_CARE_PROVIDER_SITE_OTHER): Payer: Medicare Other | Admitting: Internal Medicine

## 2015-07-26 VITALS — BP 138/76 | HR 53 | Temp 97.8°F | Resp 10 | Ht 65.0 in | Wt 181.2 lb

## 2015-07-26 DIAGNOSIS — M25541 Pain in joints of right hand: Secondary | ICD-10-CM

## 2015-07-26 DIAGNOSIS — M79644 Pain in right finger(s): Secondary | ICD-10-CM

## 2015-07-26 DIAGNOSIS — M170 Bilateral primary osteoarthritis of knee: Secondary | ICD-10-CM

## 2015-07-26 DIAGNOSIS — E119 Type 2 diabetes mellitus without complications: Secondary | ICD-10-CM | POA: Diagnosis not present

## 2015-07-26 DIAGNOSIS — E785 Hyperlipidemia, unspecified: Secondary | ICD-10-CM | POA: Diagnosis not present

## 2015-07-26 DIAGNOSIS — M19041 Primary osteoarthritis, right hand: Secondary | ICD-10-CM | POA: Diagnosis not present

## 2015-07-26 DIAGNOSIS — E669 Obesity, unspecified: Secondary | ICD-10-CM

## 2015-07-26 DIAGNOSIS — E559 Vitamin D deficiency, unspecified: Secondary | ICD-10-CM | POA: Diagnosis not present

## 2015-07-26 DIAGNOSIS — Z7289 Other problems related to lifestyle: Secondary | ICD-10-CM | POA: Diagnosis not present

## 2015-07-26 DIAGNOSIS — M1851 Other unilateral secondary osteoarthritis of first carpometacarpal joint, right hand: Secondary | ICD-10-CM

## 2015-07-26 LAB — LIPID PANEL
CHOLESTEROL: 181 mg/dL (ref 0–200)
HDL: 77 mg/dL (ref >39.00)
LDL CALC: 86 mg/dL (ref 0–99)
NONHDL: 104.02
Total CHOL/HDL Ratio: 2
Triglycerides: 90 mg/dL (ref 0.0–149.0)
VLDL: 18 mg/dL (ref 0.0–40.0)

## 2015-07-26 LAB — COMPREHENSIVE METABOLIC PANEL
ALBUMIN: 4 g/dL (ref 3.5–5.2)
ALT: 13 U/L (ref 0–35)
AST: 16 U/L (ref 0–37)
Alkaline Phosphatase: 71 U/L (ref 39–117)
BILIRUBIN TOTAL: 0.6 mg/dL (ref 0.2–1.2)
BUN: 20 mg/dL (ref 6–23)
CALCIUM: 9.1 mg/dL (ref 8.4–10.5)
CO2: 33 mEq/L — ABNORMAL HIGH (ref 19–32)
CREATININE: 1.12 mg/dL (ref 0.40–1.20)
Chloride: 103 mEq/L (ref 96–112)
GFR: 51.3 mL/min — ABNORMAL LOW (ref >60.00)
Glucose, Bld: 78 mg/dL (ref 70–99)
Potassium: 4.1 mEq/L (ref 3.5–5.1)
Sodium: 142 mEq/L (ref 135–145)
Total Protein: 6.1 g/dL (ref 6.0–8.3)

## 2015-07-26 LAB — MICROALBUMIN / CREATININE URINE RATIO
CREATININE, U: 173.9 mg/dL
MICROALB/CREAT RATIO: 0.4 mg/g (ref 0.0–30.0)

## 2015-07-26 LAB — VITAMIN D 25 HYDROXY (VIT D DEFICIENCY, FRACTURES): VITD: 42.74 ng/mL (ref 30.00–100.00)

## 2015-07-26 LAB — LDL CHOLESTEROL, DIRECT: LDL DIRECT: 92 mg/dL

## 2015-07-26 LAB — TSH: TSH: 1.78 u[IU]/mL (ref 0.35–4.50)

## 2015-07-26 LAB — HEMOGLOBIN A1C: HEMOGLOBIN A1C: 5.7 % (ref 4.6–6.5)

## 2015-07-26 MED ORDER — HYDROCODONE-ACETAMINOPHEN 10-325 MG PO TABS: ORAL_TABLET | ORAL | Status: DC

## 2015-07-26 NOTE — Progress Notes (Signed)
Pre-visit discussion using our clinic review tool. No additional management support is needed unless otherwise documented below in the visit note.  

## 2015-07-26 NOTE — Patient Instructions (Addendum)
Your thumb may have severe arthritis in it,  So I am x raying it   You have gained almost 20 lbs in the last 4 years since your bariatric surgery.  I recommend that you increase your exercise to 5 times per week ,  30 minutes of constant moving1  Your diabetes is coming back because of your weight gain  This is  Dr. Lupita Dawn  example of a  "Low GI"  Diet:  It will allow you to lose 4 to 8  lbs  per month if you follow it carefully.  Your goal with exercise is a minimum of 30 minutes of aerobic exercise 5 days per week (Walking does not count once it becomes easy!)    All of the foods can be found at grocery stores and in bulk at [employer].  The Atkins protein bars and shakes are available in more varieties at [employer], [employer] and [employer].     7 AM Breakfast:  Choose from the following:  Low carbohydrate Protein  Shakes (I recommend the  Premier Protein chocolate shakes,  EAS AdvantEdge "Carb Control" shakes  Or the Atkins shakes all are under 3 net carbs)     a scrambled egg/bacon/cheese burrito made with Mission's "carb balance" whole wheat tortilla  (about 10 net carbs )  Regulatory affairs officer (basically a quiche without the pastry crust) that is eaten cold and very convenient way to get your eggs.  8 carbs)  If you make your own protein shakes, avoid bananas and pineapple,  And use low carb greek yogurt or original /unsweetened almond or soy milk    Avoid cereal and bananas, oatmeal and cream of wheat and grits. They are loaded with carbohydrates!   10 AM: high protein snack:  Protein bar by Atkins (the snack size, under 200 cal, usually < 6 net carbs).    A stick of cheese:  Around 1 carb,  100 cal     Dannon Light n Fit [place] Yogurt  (80 cal, 8 carbs)  Other so called "protein bars" and Greek yogurts tend to be loaded with carbohydrates.  Remember, in food advertising, the word "energy" is synonymous for " carbohydrate."  Lunch:   A Sandwich using the bread  choices listed, Can use any  Eggs,  lunchmeat, grilled meat or canned tuna), avocado, regular mayo/mustard  and cheese.  A Salad using blue cheese, ranch,  Goddess or vinagrette,  Avoid taco shells, croutons or "confetti" and no "candied nuts" but regular nuts OK.   No pretzels, nabs  or chips.  Pickles and miniature sweet peppers are a good low carb alternative that provide a "crunch"  The bread is the only source of carbohydrate in a sandwich and  can be decreased by trying some of the attached alternatives to traditional loaf bread   Avoid "Low fat dressings, as well as [place] dressings They are loaded with sugar!   3 PM/ Mid day  Snack:  Consider  1 ounce of  almonds, walnuts, pistachios, pecans, peanuts,  Macadamia nuts or a nut medley.  Avoid "granola and granola bars "  Mixed nuts are ok in moderation as long as there are no raisins,  cranberries or dried fruit.   KIND bars are OK if you get the low glycemic index variety   Try the prosciutto/mozzarella cheese sticks by [employer]  In deli /backery section   High protein      6 PM  Dinner:     Meat/fowl/fish with a green salad, and either broccoli, cauliflower, green beans, spinach, brussel sprouts or  Lima beans. DO NOT BREAD THE PROTEIN!!      There is a low carb pasta by Dreamfield's that is acceptable and tastes great: only 5 digestible carbs/serving.( All grocery stores but BJs carry it ) Several ready made meals are available low carb:   Try Michel Angelo's chicken piccata or chicken or eggplant parm over low carb pasta.(Lowes and BJs)   Marjory Lies Sanchez's "Carnitas" (pulled pork, no sauce,  0 carbs) or his beef pot roast to make a dinner burrito (at [employer]J's)  Pesto over low carb pasta (bj's sells a good quality pesto in the center refrigerated section of the deli   Try satueeing  Cheral Marker with mushroooms as a good side   [employer] makes a mashed cauliflower that tastes like mashed potatoes  Whole wheat pasta is  still full of digestible carbs and  Not as low in glycemic index as Dreamfield's.   Brown rice is still rice,  So skip the rice and noodles if you eat [place] or [place] (or at least limit to 1/2 cup)  9 PM snack :   Breyer's "low carb" fudgsicle or  ice cream bar (Carb Smart line), or  Weight Watcher's ice cream bar , or another "no sugar added" ice cream;  a serving of fresh berries/cherries with whipped cream   Cheese or DANNON'S LlGHT N FIT GREEK YOGURT  8 ounces of [place] unsweetened almond/cococunut milk    Treat yourself to a parfait made with whipped cream blueberiies, walnuts and vanilla greek yogurt  Avoid bananas, pineapple, grapes  and watermelon on a regular basis because they are high in sugar.  THINK OF THEM AS DESSERT  Remember that snack Substitutions should be less than 10 NET carbs per serving and meals < 20 carbs. Remember to subtract fiber grams to get the "net carbs."

## 2015-07-26 NOTE — Progress Notes (Signed)
Subjective:  Patient ID: Jodi [name], female    DOB: 1946-01-01  Age: 70 y.o. MRN: [id number]  CC: The primary encounter diagnosis was Pain in thumb joint with movement, right. Diagnoses of Diabetes mellitus without complication ([place]), Hyperlipidemia, Vitamin D deficiency, Other problems related to lifestyle, Other secondary osteoarthritis of first carpometacarpal joint of right hand, Osteoarthritis of both knees, unspecified osteoarthritis type, and Obesity were also pertinent to this visit.  HPI ALDYTH KETRING presents for follow up on diabetes , Type 2, obesity s/p gastric bypass, anxiety and chronic knee pain.    Obesity: Her nadir was 162 in 2013,  Weight is Now 181,  BMI 30  Lab Results  Component Value Date   HGBA1C 5.7 07/26/2015   Not checking sugars.  eating 3 meals daily,  vomits if she eats one bite too much.  Says she is exercising 20 minutes 2-3 times per week    Not sleeping well when granddaughter stays with her in same bed  bc she moves around a lot.  This occurs along with granddaughter bedwetting 3-4 days per week   Bowels moving ok  Right wrist pain,  Wearing a splint off and on for the last year.  Aggravated by Coloring. . Pain is at the base of the thumb. Starts burning if she uses the hand    Outpatient Prescriptions Prior to Visit  Medication Sig Dispense Refill  . ALPRAZolam (XANAX) 0.5 MG tablet TAKE 1 TABLET BY MOUTH TWICE A DAY AS NEEDED FOR ANXIETY OR INSOMNIA 60 tablet 5  . amitriptyline (ELAVIL) 25 MG tablet TAKE 1 TABLET BY MOUTH AT BEDTIME AND MAY INCREASE TO 2 TABLETS IF NEEDED AFTER 1 WEEK 60 tablet 2  . amitriptyline (ELAVIL) 25 MG tablet TAKE 1 TABLET BY MOUTH AT BEDTIME AND MAY INCREASE TO 2 TABLETS IF NEEDED AFTER 1 WEEK 60 tablet 2  . amoxicillin (AMOXIL) 500 MG capsule Take 4 capsules by mouth daily.    Marland Kitchen buPROPion (WELLBUTRIN SR) 100 MG 12 hr tablet TAKE 1 TABLET (100 MG TOTAL) BY MOUTH 2 (TWO) TIMES DAILY. 60 tablet 2  .  hydrochlorothiazide (HYDRODIURIL) 25 MG tablet TAKE 1 TABLET (25 MG TOTAL) BY MOUTH DAILY. 90 tablet 1  . meloxicam (MOBIC) 15 MG tablet TAKE 1 TABLET (15 MG TOTAL) BY MOUTH DAILY. 90 tablet 2  . omeprazole (PRILOSEC) 20 MG capsule TAKE 1 CAPSULE (20 MG TOTAL) BY MOUTH DAILY. 30 capsule 6  . sertraline (ZOLOFT) 100 MG tablet TAKE 1 TABLET (100 MG TOTAL) BY MOUTH DAILY. 90 tablet 3  . Tdap (BOOSTRIX) 5-2.5-18.5 LF-MCG/0.5 injection Inject 0.5 mLs into the muscle once. 0.5 mL 0  . traMADol (ULTRAM) 50 MG tablet TAKE 2 TABLETS BY MOUTH EVERY 8 HOURS AS NEEDED FOR PAIN 180 tablet 3  . traZODone (DESYREL) 100 MG tablet Take 100 mg by mouth 2 (two) times daily.    Marland Kitchen HYDROcodone-acetaminophen (NORCO) 10-325 MG tablet TAKE 1 TABLET BY MOUTH EVERY 6 HOURS AS NEEDED FOR PAIN, 120 tablet 0   No facility-administered medications prior to visit.    Review of Systems;  Patient denies headache, fevers, malaise, unintentional weight loss, skin rash, eye pain, sinus congestion and sinus pain, sore throat, dysphagia,  hemoptysis , cough, dyspnea, wheezing, chest pain, palpitations, orthopnea, edema, abdominal pain, nausea, melena, diarrhea, constipation, flank pain, dysuria, hematuria, urinary  Frequency, nocturia, numbness, tingling, seizures,  Focal weakness, Loss of consciousness,  Tremor, insomnia, depression, anxiety, and suicidal ideation.  Objective:  BP 138/76 mmHg  Pulse 53  Temp(Src) 97.8 F (36.6 C) (Oral)  Resp 10  Ht 5\' 5"  (1.651 m)  Wt 181 lb 4 oz (82.214 kg)  BMI 30.16 kg/m2  SpO2 96%  BP Readings from Last 3 Encounters:  07/26/15 138/76  04/26/15 146/72  02/10/15 118/78    Wt Readings from Last 3 Encounters:  07/26/15 181 lb 4 oz (82.214 kg)  04/26/15 179 lb 8 oz (81.421 kg)  02/10/15 182 lb (82.555 kg)    General appearance: alert, cooperative and appears stated age Ears: normal TM's and external ear canals both ears Throat: lips, mucosa, and tongue normal; teeth and  gums normal Neck: no adenopathy, no carotid bruit, supple, symmetrical, trachea midline and thyroid not enlarged, symmetric, no tenderness/mass/nodules Back: symmetric, no curvature. ROM normal. No CVA tenderness. Lungs: clear to auscultation bilaterally Heart: regular rate and rhythm, S1, S2 normal, no murmur, click, rub or gallop Abdomen: soft, non-tender; bowel sounds normal; no masses,  no organomegaly Pulses: 2+ and symmetric Skin: Skin color, texture, turgor normal. No rashes or lesions Lymph nodes: Cervical, supraclavicular, and axillary nodes normal.  Lab Results  Component Value Date   HGBA1C 5.7 07/26/2015   HGBA1C 5.8 02/10/2015   HGBA1C 5.4 06/30/2014    Lab Results  Component Value Date   CREATININE 1.12 07/26/2015   CREATININE 0.98 02/10/2015   CREATININE 1.14 06/30/2014    Lab Results  Component Value Date   WBC 5.5 02/10/2015   HGB 12.5 02/10/2015   HCT 38.0 02/10/2015   PLT 245.0 02/10/2015   GLUCOSE 78 07/26/2015   CHOL 181 07/26/2015   TRIG 90.0 07/26/2015   HDL 77.00 07/26/2015   LDLDIRECT 92.0 07/26/2015   LDLCALC 86 07/26/2015   ALT 13 07/26/2015   AST 16 07/26/2015   NA 142 07/26/2015   K 4.1 07/26/2015   CL 103 07/26/2015   CREATININE 1.12 07/26/2015   BUN 20 07/26/2015   CO2 33* 07/26/2015   TSH 1.78 07/26/2015   HGBA1C 5.7 07/26/2015   MICROALBUR <0.7 07/26/2015      Assessment & Plan:   Problem List Items Addressed This Visit    Obesity    I have addressed  BMI and recommended wt loss of 10% of body weight over the next 6 months using a low fat, low starch, high protein  fruit/vegetable based Mediterranean diet and 30 minutes of aerobic exercise a minimum of 5 days per week.        Diabetes mellitus without complication ([place])     Historically well-controlled on diet alone since her gastric bypass  In 2011.  hemoglobin A1c has been consistently at or  less than 6.0 .  Patient is NOT up-to-date on eye exams and foot exam is normal  today. Patient has no microalbuminuria. Patient is tolerating statin therapy for CAD risk reduction and on ACE/ARB for renal protection and hypertension  Lab Results  Component Value Date   HGBA1C 5.7 07/26/2015   Lab Results  Component Value Date   MICROALBUR <0.7 07/26/2015             DJD (degenerative joint disease) of knee    With history of TKR and continued pain.  I have refilled her vicodin. The risks and benefits of narcotics use were reviewed with patient today including excessive sedation leading to respiratory depression,  impaired thinking/driving, and addiction.  Patient was advised to avoid concurrent use with alcohol, to use medication only as needed and  not to share with others  .            Relevant Medications   HYDROcodone-acetaminophen (NORCO) 10-325 MG tablet   Degenerative arthritis of carpometacarpal joint of thumb    By plain films today .  Trial of NSAID      Relevant Medications   HYDROcodone-acetaminophen (NORCO) 10-325 MG tablet    Other Visit Diagnoses    Pain in thumb joint with movement, right    -  Primary    Relevant Orders    DG Hand Complete Right (Completed)    Hyperlipidemia        Vitamin D deficiency        Other problems related to lifestyle           I am having Ms. Vicknair maintain her traZODone, amitriptyline, amoxicillin, Tdap, ALPRAZolam, omeprazole, traMADol, buPROPion, sertraline, amitriptyline, meloxicam, hydrochlorothiazide, and HYDROcodone-acetaminophen.  Meds ordered this encounter  Medications  . HYDROcodone-acetaminophen (NORCO) 10-325 MG tablet    Sig: TAKE 1 TABLET BY MOUTH EVERY 6 HOURS AS NEEDED FOR PAIN,    Dispense:  120 tablet    Refill:  0    May refill on or after August 10 2015    Medications Discontinued During This Encounter  Medication Reason  . HYDROcodone-acetaminophen (NORCO) 10-325 MG tablet Reorder    Follow-up: No Follow-up on file.   [name], MD

## 2015-07-27 LAB — HIV ANTIBODY (ROUTINE TESTING W REFLEX): HIV 1&2 Ab, 4th Generation: NONREACTIVE

## 2015-07-28 DIAGNOSIS — M189 Osteoarthritis of first carpometacarpal joint, unspecified: Secondary | ICD-10-CM | POA: Insufficient documentation

## 2015-07-28 NOTE — Assessment & Plan Note (Signed)
By plain films today .  Trial of NSAID

## 2015-07-28 NOTE — Assessment & Plan Note (Signed)
Historically well-controlled on diet alone since her gastric bypass  In 2011.  hemoglobin A1c has been consistently at or  less than 6.0 .  Patient is NOT up-to-date on eye exams and foot exam is normal today. Patient has no microalbuminuria. Patient is tolerating statin therapy for CAD risk reduction and on ACE/ARB for renal protection and hypertension  Lab Results  Component Value Date   HGBA1C 5.7 07/26/2015   Lab Results  Component Value Date   MICROALBUR <0.7 07/26/2015

## 2015-07-28 NOTE — Assessment & Plan Note (Signed)
With history of TKR and continued pain.  I have refilled her vicodin. The risks and benefits of narcotics use were reviewed with patient today including excessive sedation leading to respiratory depression,  impaired thinking/driving, and addiction.  Patient was advised to avoid concurrent use with alcohol, to use medication only as needed and not to share with others  .

## 2015-07-29 NOTE — Assessment & Plan Note (Signed)
I have addressed  BMI and recommended wt loss of 10% of body weight over the next 6 months using a low fat, low starch, high protein  fruit/vegetable based Mediterranean diet and 30 minutes of aerobic exercise a minimum of 5 days per week.   

## 2015-08-24 ENCOUNTER — Other Ambulatory Visit: Payer: Self-pay | Admitting: Internal Medicine

## 2015-08-25 NOTE — Telephone Encounter (Signed)
Medication last refilled 02/21/15. Patient last seen 07/26/15. Please advise?  meloxicam refilled 07/24/15 for 90-days and 2 refills.

## 2015-09-07 ENCOUNTER — Other Ambulatory Visit: Payer: Self-pay | Admitting: Internal Medicine

## 2015-09-18 ENCOUNTER — Other Ambulatory Visit: Payer: Self-pay | Admitting: Internal Medicine

## 2015-09-28 ENCOUNTER — Other Ambulatory Visit: Payer: Self-pay | Admitting: Internal Medicine

## 2015-09-28 NOTE — Telephone Encounter (Signed)
Last OV 07/26/15... Last refill 04/04/15 #180 with 3 refills... Okay to refill?

## 2015-09-29 NOTE — Telephone Encounter (Signed)
Rx faxed to pharmacy  

## 2015-09-29 NOTE — Telephone Encounter (Signed)
Refilled needs appt in Nov

## 2015-10-10 ENCOUNTER — Other Ambulatory Visit: Payer: Self-pay | Admitting: Internal Medicine

## 2015-10-10 MED ORDER — AMITRIPTYLINE HCL 25 MG PO TABS: ORAL_TABLET | ORAL | 2 refills | Status: DC

## 2015-10-10 NOTE — Telephone Encounter (Signed)
Refilled 07/17/15. Pt last seen 07/26/15. Please advise?

## 2015-10-10 NOTE — Telephone Encounter (Signed)
refilled 

## 2015-11-03 ENCOUNTER — Telehealth: Payer: Self-pay | Admitting: Internal Medicine

## 2015-11-03 NOTE — Telephone Encounter (Signed)
Pt called and stated that she lost her rx for omeprazole (PRILOSEC) 20 MG capsule. Can we send to the pharmacy? Thank you!  Pharmacy - [id number] - [place], [address]  Call pt @ [phone]

## 2015-11-06 MED ORDER — OMEPRAZOLE 20 MG PO CPDR: 20.0000 mg | DELAYED_RELEASE_CAPSULE | Freq: Every day | ORAL | 3 refills | Status: DC

## 2015-11-06 NOTE — Telephone Encounter (Signed)
Medication has been refilled.

## 2015-11-07 ENCOUNTER — Telehealth: Payer: Self-pay | Admitting: Internal Medicine

## 2015-11-07 NOTE — Telephone Encounter (Signed)
Faxed

## 2015-11-07 NOTE — Telephone Encounter (Signed)
[facility] called asking for an Omega 3 order . Pt requesting this. Please advise, thank you!  Phone [phone]

## 2015-11-28 ENCOUNTER — Other Ambulatory Visit: Payer: Self-pay | Admitting: Internal Medicine

## 2015-11-28 NOTE — Telephone Encounter (Signed)
Refill for 30 days only.  OFFICE VISIT NEEDED prior to any more refills 

## 2015-11-28 NOTE — Telephone Encounter (Signed)
Refilled 08/25/15. Pt last seen 07/26/15/ please advise?

## 2015-12-20 ENCOUNTER — Other Ambulatory Visit: Payer: Self-pay | Admitting: Internal Medicine

## 2015-12-27 ENCOUNTER — Other Ambulatory Visit: Payer: Self-pay | Admitting: Internal Medicine

## 2015-12-28 ENCOUNTER — Telehealth: Payer: Self-pay

## 2015-12-28 NOTE — Telephone Encounter (Signed)
Patient is aware she needed to come in for further refill last OV 7/17 please advise to refill.

## 2015-12-28 NOTE — Telephone Encounter (Signed)
Patient notified

## 2015-12-28 NOTE — Telephone Encounter (Signed)
Alprazolam 0.5 mg fax [facility]

## 2016-01-03 ENCOUNTER — Telehealth: Payer: Self-pay | Admitting: Internal Medicine

## 2016-01-03 NOTE — Telephone Encounter (Signed)
Pt is requesting a refill on her ALPRAZolam (XANAX) 0.5 MG tablet. She has an appt scheduled for 02/07/16. Please advise, thank you!  Pharmacy - [id number] - [place], [address]  Call pt @ [phone]

## 2016-01-04 ENCOUNTER — Other Ambulatory Visit: Payer: Self-pay | Admitting: Internal Medicine

## 2016-01-04 MED ORDER — ALPRAZOLAM 0.5 MG PO TABS: 0.5000 mg | ORAL_TABLET | Freq: Every evening | ORAL | 0 refills | Status: DC | PRN

## 2016-01-04 NOTE — Telephone Encounter (Signed)
Patient notified and understood instructions to take 1 tablet daily for her current mediction

## 2016-01-04 NOTE — Telephone Encounter (Signed)
Per Dr. Derrel Nip Alprazolam 0.5 mg sent to pharmacy must only fill when she completes her other Rx sent in 12/28/2015 and now must take her current one 1 tablet daily

## 2016-01-04 NOTE — Telephone Encounter (Signed)
Spoke with patient she is requesting a 2nd refill on her Alprazolam 0.5 mg to last up until her Appt on 02/07/2016. Her other Rx was sent over 12/28/2015  1 tablet PO PRN #30 and she has that medication only for 30 days  Please advise

## 2016-01-04 NOTE — Telephone Encounter (Signed)
IF SHE RECEIVED #30 ON DEC 21  WHY IS SHE OUT?  THAT SHOULD HAVE LASTED A MONTH . FIND OUT OUT MANY SHE HAS LEFT.

## 2016-01-04 NOTE — Telephone Encounter (Signed)
Rx was sent to pharmacy 12/28/2015

## 2016-01-11 ENCOUNTER — Other Ambulatory Visit: Payer: Self-pay | Admitting: Internal Medicine

## 2016-01-11 NOTE — Telephone Encounter (Signed)
Last filled 09/29/15 180 1rf

## 2016-01-12 MED ORDER — TRAMADOL HCL 50 MG PO TABS: ORAL_TABLET | ORAL | 1 refills | Status: DC

## 2016-01-12 NOTE — Addendum Note (Signed)
Addended by: Valere Dross on: 01/12/2016 04:23 PM   Modules accepted: Orders

## 2016-01-12 NOTE — Telephone Encounter (Signed)
Tramadol 50 mg reordered due to last reorder did not print properly

## 2016-01-12 NOTE — Telephone Encounter (Signed)
Please advise 

## 2016-01-18 ENCOUNTER — Other Ambulatory Visit: Payer: Self-pay | Admitting: Internal Medicine

## 2016-02-06 ENCOUNTER — Other Ambulatory Visit: Payer: Self-pay | Admitting: Internal Medicine

## 2016-02-06 NOTE — Telephone Encounter (Signed)
PT last refill of Elavil was on 01/06/16. Pt last OV and labs were on 07/26/15. Pt coming tomorrow for OV.

## 2016-02-07 ENCOUNTER — Ambulatory Visit (INDEPENDENT_AMBULATORY_CARE_PROVIDER_SITE_OTHER): Payer: Medicare Other | Admitting: Internal Medicine

## 2016-02-07 ENCOUNTER — Other Ambulatory Visit: Payer: Self-pay | Admitting: *Deleted

## 2016-02-07 ENCOUNTER — Encounter: Payer: Self-pay | Admitting: Internal Medicine

## 2016-02-07 VITALS — BP 142/90 | HR 86 | Temp 97.4°F | Resp 16 | Wt 177.0 lb

## 2016-02-07 DIAGNOSIS — E119 Type 2 diabetes mellitus without complications: Secondary | ICD-10-CM | POA: Diagnosis not present

## 2016-02-07 DIAGNOSIS — Z23 Encounter for immunization: Secondary | ICD-10-CM

## 2016-02-07 DIAGNOSIS — M1851 Other unilateral secondary osteoarthritis of first carpometacarpal joint, right hand: Secondary | ICD-10-CM | POA: Diagnosis not present

## 2016-02-07 DIAGNOSIS — M170 Bilateral primary osteoarthritis of knee: Secondary | ICD-10-CM

## 2016-02-07 DIAGNOSIS — Z79899 Other long term (current) drug therapy: Secondary | ICD-10-CM | POA: Diagnosis not present

## 2016-02-07 DIAGNOSIS — F419 Anxiety disorder, unspecified: Secondary | ICD-10-CM | POA: Diagnosis not present

## 2016-02-07 DIAGNOSIS — Z79891 Long term (current) use of opiate analgesic: Secondary | ICD-10-CM | POA: Diagnosis not present

## 2016-02-07 LAB — COMPREHENSIVE METABOLIC PANEL
ALBUMIN: 4.2 g/dL (ref 3.5–5.2)
ALK PHOS: 72 U/L (ref 39–117)
ALT: 15 U/L (ref 0–35)
AST: 18 U/L (ref 0–37)
BILIRUBIN TOTAL: 0.5 mg/dL (ref 0.2–1.2)
BUN: 15 mg/dL (ref 6–23)
CALCIUM: 9.3 mg/dL (ref 8.4–10.5)
CO2: 32 mEq/L (ref 19–32)
Chloride: 103 mEq/L (ref 96–112)
Creatinine, Ser: 1.13 mg/dL (ref 0.40–1.20)
GFR: 50.7 mL/min — ABNORMAL LOW (ref >60.00)
Glucose, Bld: 106 mg/dL — ABNORMAL HIGH (ref 70–99)
Potassium: 4 mEq/L (ref 3.5–5.1)
SODIUM: 141 meq/L (ref 135–145)
TOTAL PROTEIN: 6.5 g/dL (ref 6.0–8.3)

## 2016-02-07 LAB — LIPID PANEL
CHOLESTEROL: 199 mg/dL (ref 0–200)
HDL: 77.7 mg/dL (ref >39.00)
LDL Cholesterol: 92 mg/dL (ref 0–99)
NonHDL: 121.47
Total CHOL/HDL Ratio: 3
Triglycerides: 147 mg/dL (ref 0.0–149.0)
VLDL: 29.4 mg/dL (ref 0.0–40.0)

## 2016-02-07 LAB — HEMOGLOBIN A1C: HEMOGLOBIN A1C: 5.7 % (ref 4.6–6.5)

## 2016-02-07 MED ORDER — HYDROCODONE-ACETAMINOPHEN 10-325 MG PO TABS: ORAL_TABLET | ORAL | 0 refills | Status: DC

## 2016-02-07 MED ORDER — TRAZODONE HCL 100 MG PO TABS: 100.0000 mg | ORAL_TABLET | Freq: Two times a day (BID) | ORAL | 2 refills | Status: DC

## 2016-02-07 MED ORDER — CLONAZEPAM 0.5 MG PO TABS: 0.5000 mg | ORAL_TABLET | Freq: Two times a day (BID) | ORAL | 2 refills | Status: DC | PRN

## 2016-02-07 MED ORDER — AMITRIPTYLINE HCL 25 MG PO TABS: 50.0000 mg | ORAL_TABLET | Freq: Every day | ORAL | 2 refills | Status: DC

## 2016-02-07 MED ORDER — MELOXICAM 15 MG PO TABS: ORAL_TABLET | ORAL | 2 refills | Status: DC

## 2016-02-07 MED ORDER — TRAMADOL HCL 50 MG PO TABS: ORAL_TABLET | ORAL | 2 refills | Status: DC

## 2016-02-07 NOTE — Patient Instructions (Addendum)
I can no longer continue to refill your VICODIN indefinitely and I plan to reduce your daily use of vicodin gradually over the next  6 MONTHS .  You may continue to use tramadol, or you can find another doctor who will prescribe your Vicodin for you,   YOUR NEXT REFILL WILL ALLOW YOU TO USE ONLY 2 VICODIN DAILY  FOR THE NEXT 3 MONTHS  I WILL NEED TO SEE YOU AGAIN IN 3 MONTHS OR PRIOR TO ANY MORE REFILLS

## 2016-02-07 NOTE — Progress Notes (Signed)
Subjective:came up,  Had a headache for 1-2 days,    Patient ID: Jodi [name], female    DOB: [birth year]  Age: 71 y.o. MRN: [place]:[id number]  CC: The primary encounter diagnosis was Diabetes mellitus without complication ([place]). Diagnoses of Encounter for immunization, Other secondary osteoarthritis of first carpometacarpal joint of right hand, Anxiety, and Osteoarthritis of both knees, unspecified osteoarthritis type were also pertinent to this visit.  HP Jodi MCCAIG presents for follow up on multiple medical issues including type 2 DM with obesity s/p gastric bypass surgery .    She is not exercising or checking her sugars.  Had a busy , stressful holiday season due to family dysfunction.   Chronic pain due to DJD knees  And thumb pain of right hands  Requesting refill of vicodin   Some emotional stress due to family strife.youngest sons has 37 yr old daughter ("the apple of my eye") but the mother of child is not liked by the family   Has lost weight 4 lbs  Walks for exercise around the yard,  Also has a stationery bike .  Averages 3/week.  Insurance now offering silver sneakers program  Some balance concerns.  Had a fall in the house several months ago around thanksgiving caused by slippery floor. Bumped her right occipital area,  Large goose egg  .  Used advil for headache.     Need refills on trazodone alprazolam and elavil   Takes one vicodin bid and a 3rd in the afternoon if up on her feet.  Using alprazolam several times daily   Outpatient Medications Prior to Visit  Medication Sig Dispense Refill  . amoxicillin (AMOXIL) 500 MG capsule Take 4 capsules by mouth daily.    Marland Kitchen buPROPion (WELLBUTRIN SR) 100 MG 12 hr tablet TAKE 1 TABLET (100 MG TOTAL) BY MOUTH 2 (TWO) TIMES DAILY. 60 tablet 2  . hydrochlorothiazide (HYDRODIURIL) 25 MG tablet TAKE 1 TABLET (25 MG TOTAL) BY MOUTH DAILY. 90 tablet 1  . omeprazole (PRILOSEC) 20 MG capsule Take 1 capsule (20 mg total) by mouth daily. 30  capsule 3  . sertraline (ZOLOFT) 100 MG tablet TAKE 1 TABLET (100 MG TOTAL) BY MOUTH DAILY. 90 tablet 3  . Tdap (BOOSTRIX) 5-2.5-18.5 LF-MCG/0.5 injection Inject 0.5 mLs into the muscle once. 0.5 mL 0  . ALPRAZolam (XANAX) 0.5 MG tablet Take 1 tablet (0.5 mg total) by mouth at bedtime as needed for anxiety. 30 tablet 0  . amitriptyline (ELAVIL) 25 MG tablet TAKE 1 TABLET BY MOUTH AT BEDTIME AND MAY INCREASE TO 2 TABLETS IF NEEDED AFTER 1 WEEK 60 tablet 2  . amitriptyline (ELAVIL) 25 MG tablet TAKE 1 TABLET BY MOUTH AT BEDTIME AND MAY INCREASE TO 2 TABLETS IF NEEDED AFTER 1 WEEK 60 tablet 2  . HYDROcodone-acetaminophen (NORCO) 10-325 MG tablet TAKE 1 TABLET BY MOUTH EVERY 6 HOURS AS NEEDED FOR PAIN, 120 tablet 0  . meloxicam (MOBIC) 15 MG tablet TAKE 1 TABLET (15 MG TOTAL) BY MOUTH DAILY. 90 tablet 2  . traMADol (ULTRAM) 50 MG tablet TAKE 2 TABS BY MOUTH EVERY 8 HOURS AS NEEDED FOR PAIN 180 tablet 1  . traZODone (DESYREL) 100 MG tablet Take 100 mg by mouth 2 (two) times daily.     No facility-administered medications prior to visit.     Review of Systems;  Patient denies headache, fevers, malaise, unintentional weight loss, skin rash, eye pain, sinus congestion and sinus pain, sore throat, dysphagia,  hemoptysis , cough, dyspnea,  wheezing, chest pain, palpitations, orthopnea, edema, abdominal pain, nausea, melena, diarrhea, constipation, flank pain, dysuria, hematuria, urinary  Frequency, nocturia, numbness, tingling, seizures,  Focal weakness, Loss of consciousness,  Tremor,  Depression, and suicidal ideation.      Objective:  BP (!) 142/90   Pulse 86   Temp 97.4 F (36.3 C) (Oral)   Resp 16   Wt 177 lb (80.3 kg)   SpO2 99%   BMI 29.45 kg/m   BP Readings from Last 3 Encounters:  02/07/16 (!) 142/90  07/26/15 138/76  04/26/15 (!) 146/72    Wt Readings from Last 3 Encounters:  02/07/16 177 lb (80.3 kg)  07/26/15 181 lb 4 oz (82.2 kg)  04/26/15 179 lb 8 oz (81.4 kg)    General  appearance: alert, cooperative and appears stated age Ears: normal TM's and external ear canals both ears Throat: lips, mucosa, and tongue normal; teeth and gums normal Neck: no adenopathy, no carotid bruit, supple, symmetrical, trachea midline and thyroid not enlarged, symmetric, no tenderness/mass/nodules Back: symmetric, no curvature. ROM normal. No CVA tenderness. Lungs: clear to auscultation bilaterally Heart: regular rate and rhythm, S1, S2 normal, no murmur, click, rub or gallop Abdomen: soft, non-tender; bowel sounds normal; no masses,  no organomegaly Pulses: 2+ and symmetric Skin: Skin color, texture, turgor normal. No rashes or lesions Lymph nodes: Cervical, supraclavicular, and axillary nodes normal.  Lab Results  Component Value Date   HGBA1C 5.7 02/07/2016   HGBA1C 5.7 07/26/2015   HGBA1C 5.8 02/10/2015    Lab Results  Component Value Date   CREATININE 1.13 02/07/2016   CREATININE 1.12 07/26/2015   CREATININE 0.98 02/10/2015    Lab Results  Component Value Date   WBC 5.5 02/10/2015   HGB 12.5 02/10/2015   HCT 38.0 02/10/2015   PLT 245.0 02/10/2015   GLUCOSE 106 (H) 02/07/2016   CHOL 199 02/07/2016   TRIG 147.0 02/07/2016   HDL 77.70 02/07/2016   LDLDIRECT 92.0 07/26/2015   LDLCALC 92 02/07/2016   ALT 15 02/07/2016   AST 18 02/07/2016   NA 141 02/07/2016   K 4.0 02/07/2016   CL 103 02/07/2016   CREATININE 1.13 02/07/2016   BUN 15 02/07/2016   CO2 32 02/07/2016   TSH 1.78 07/26/2015   HGBA1C 5.7 02/07/2016   MICROALBUR <0.7 07/26/2015     Assessment & Plan:   Problem List Items Addressed This Visit    Anxiety    She is tolerating  change in therapy from zoloft to wellbutrin for lack of concentration        Relevant Medications   traZODone (DESYREL) 100 MG tablet   amitriptyline (ELAVIL) 25 MG tablet   Degenerative arthritis of carpometacarpal joint of thumb    By plain films today .  Continue prn use of  NSAID       Relevant Medications     HYDROcodone-acetaminophen (NORCO) 10-325 MG tablet   HYDROcodone-acetaminophen (NORCO) 10-325 MG tablet   meloxicam (MOBIC) 15 MG tablet   traMADol (ULTRAM) 50 MG tablet   Diabetes mellitus without complication (Jodi [name]) - Primary     Historically well-controlled on diet alone since her gastric bypass  In 2011.  hemoglobin A1c has been consistently at or  less than 6.0 .  Patient is NOT up-to-date on eye exams and foot exam is normal today. Patient has no microalbuminuria. Patient is tolerating statin therapy for CAD risk reduction and on ACE/ARB for renal protection and hypertension  Lab Results  Component Value Date  HGBA1C 5.7 02/07/2016   Lab Results  Component Value Date   MICROALBUR <0.7 07/26/2015             Relevant Orders   Comprehensive metabolic panel (Completed)   Hemoglobin A1c (Completed)   Lipid panel (Completed)   DJD (degenerative joint disease) of knee    With history of TKR and continued pain. She has not had any ER visits  And has not requested any early refills.  Her Refill history was confirmed via [place] Controlled Substance database by me today during her visit and there have been no prescriptions of controlled substances filled from any providers other than me. .  I have refilled her vicodin and advised her that I will begin weaning her  From daily use with a gradual reduction in dose.  Continue use of tramadol . The risks and benefits of narcotics use were reviewed with patient today including excessive sedation leading to respiratory depression,  impaired thinking/driving, and addiction.  Patient was advised to avoid concurrent use with alcohol, to use medication only as needed and not to share with others  .            Relevant Medications   HYDROcodone-acetaminophen (NORCO) 10-325 MG tablet   HYDROcodone-acetaminophen (NORCO) 10-325 MG tablet   meloxicam (MOBIC) 15 MG tablet   traMADol (ULTRAM) 50 MG tablet    Other Visit Diagnoses    Encounter for  immunization       Relevant Orders   Flu vaccine HIGH DOSE PF (Completed)      I have discontinued Ms. Linhardt's amitriptyline and ALPRAZolam. I have also changed her traZODone, amitriptyline, and meloxicam. Additionally, I am having her start on clonazePAM. Lastly, I am having her maintain her amoxicillin, Tdap, sertraline, omeprazole, buPROPion, hydrochlorothiazide, HYDROcodone-acetaminophen, HYDROcodone-acetaminophen, and traMADol.  Meds ordered this encounter  Medications  . traZODone (DESYREL) 100 MG tablet    Sig: Take 1 tablet (100 mg total) by mouth 2 (two) times daily.    Dispense:  60 tablet    Refill:  2  . amitriptyline (ELAVIL) 25 MG tablet    Sig: Take 2 tablets (50 mg total) by mouth at bedtime.    Dispense:  60 tablet    Refill:  2  . clonazePAM (KLONOPIN) 0.5 MG tablet    Sig: Take 1 tablet (0.5 mg total) by mouth 2 (two) times daily as needed for anxiety.    Dispense:  60 tablet    Refill:  2  . DISCONTD: HYDROcodone-acetaminophen (NORCO) 10-325 MG tablet    Sig: TAKE 1 TABLET BY MOUTH EVERY 8 HOURS AS NEEDED FOR PAIN, MAXIMUM 2 DAILY    Dispense:  60 tablet    Refill:  0  . HYDROcodone-acetaminophen (NORCO) 10-325 MG tablet    Sig: TAKE 1 TABLET BY MOUTH EVERY 8 HOURS AS NEEDED FOR PAIN, MAXIMUM 2 DAILY    Dispense:  60 tablet    Refill:  0    MAY REFILL ON OR AFTER March 07 2016  . HYDROcodone-acetaminophen (NORCO) 10-325 MG tablet    Sig: TAKE 1 TABLET BY MOUTH EVERY 8 HOURS AS NEEDED FOR PAIN, MAXIMUM 2 DAILY    Dispense:  60 tablet    Refill:  0    MAY REFILL ON OR AFTER April 07 2016  . meloxicam (MOBIC) 15 MG tablet    Sig: TAKE 1 TABLET (15 MG TOTAL) BY MOUTH DAILY AS NEEDED FOR JOINT PAIN    Dispense:  90 tablet  Refill:  2  . traMADol (ULTRAM) 50 MG tablet    Sig: TAKE 2 TABS BY MOUTH EVERY 8 HOURS AS NEEDED FOR PAIN    Dispense:  180 tablet    Refill:  2    Not to exceed 4 additional fills before 03/27/2016.  A total of 25 minutes of face to  face time was spent with patient more than half of which was spent in counselling about the above mentioned conditions  and coordination of care   Medications Discontinued During This Encounter  Medication Reason  . amitriptyline (ELAVIL) 25 MG tablet Duplicate  . traZODone (DESYREL) 100 MG tablet Reorder  . amitriptyline (ELAVIL) 25 MG tablet Reorder  . HYDROcodone-acetaminophen (NORCO) 10-325 MG tablet Reorder  . HYDROcodone-acetaminophen (NORCO) 10-325 MG tablet Reorder  . ALPRAZolam (XANAX) 0.5 MG tablet   . meloxicam (MOBIC) 15 MG tablet Reorder  . traMADol (ULTRAM) 50 MG tablet Reorder    Follow-up: No Follow-up on file.   [name], MD

## 2016-02-07 NOTE — Progress Notes (Signed)
Pre visit review using our clinic review tool, if applicable. No additional management support is needed unless otherwise documented below in the visit note. 

## 2016-02-08 NOTE — Assessment & Plan Note (Signed)
Historically well-controlled on diet alone since her gastric bypass  In 2011.  hemoglobin A1c has been consistently at or  less than 6.0 .  Patient is NOT up-to-date on eye exams and foot exam is normal today. Patient has no microalbuminuria. Patient is tolerating statin therapy for CAD risk reduction and on ACE/ARB for renal protection and hypertension  Lab Results  Component Value Date   HGBA1C 5.7 02/07/2016   Lab Results  Component Value Date   MICROALBUR <0.7 07/26/2015

## 2016-02-08 NOTE — Assessment & Plan Note (Signed)
She is tolerating  change in therapy from zoloft to wellbutrin for lack of concentration   

## 2016-02-08 NOTE — Assessment & Plan Note (Signed)
By plain films today .  Continue prn use of  NSAID

## 2016-02-08 NOTE — Assessment & Plan Note (Addendum)
With history of TKR and continued pain. She has not had any ER visits  And has not requested any early refills.  Her Refill history was confirmed via  Controlled Substance database by me today during her visit and there have been no prescriptions of controlled substances filled from any providers other than me. .  I have refilled her vicodin and advised her that I will begin weaning her  From daily use with a gradual reduction in dose.  Continue use of tramadol . The risks and benefits of narcotics use were reviewed with patient today including excessive sedation leading to respiratory depression,  impaired thinking/driving, and addiction.  Patient was advised to avoid concurrent use with alcohol, to use medication only as needed and not to share with others  .

## 2016-03-16 ENCOUNTER — Other Ambulatory Visit: Payer: Self-pay | Admitting: Internal Medicine

## 2016-04-19 ENCOUNTER — Telehealth: Payer: Self-pay | Admitting: Internal Medicine

## 2016-04-19 NOTE — Telephone Encounter (Signed)
Attempted to call pt. Invalid # on file. Pt due for AWV  [facility] call back # [phone]

## 2016-05-04 ENCOUNTER — Other Ambulatory Visit: Payer: Self-pay | Admitting: Internal Medicine

## 2016-05-09 ENCOUNTER — Ambulatory Visit (INDEPENDENT_AMBULATORY_CARE_PROVIDER_SITE_OTHER): Payer: Medicare Other | Admitting: Internal Medicine

## 2016-05-09 ENCOUNTER — Encounter: Payer: Self-pay | Admitting: Internal Medicine

## 2016-05-09 VITALS — BP 150/84 | HR 58 | Temp 98.0°F | Resp 16 | Wt 185.1 lb

## 2016-05-09 DIAGNOSIS — G4733 Obstructive sleep apnea (adult) (pediatric): Secondary | ICD-10-CM

## 2016-05-09 DIAGNOSIS — E1169 Type 2 diabetes mellitus with other specified complication: Secondary | ICD-10-CM

## 2016-05-09 DIAGNOSIS — E785 Hyperlipidemia, unspecified: Secondary | ICD-10-CM | POA: Diagnosis not present

## 2016-05-09 DIAGNOSIS — Z1231 Encounter for screening mammogram for malignant neoplasm of breast: Secondary | ICD-10-CM

## 2016-05-09 DIAGNOSIS — E119 Type 2 diabetes mellitus without complications: Secondary | ICD-10-CM

## 2016-05-09 DIAGNOSIS — I499 Cardiac arrhythmia, unspecified: Secondary | ICD-10-CM

## 2016-05-09 DIAGNOSIS — I491 Atrial premature depolarization: Secondary | ICD-10-CM | POA: Diagnosis not present

## 2016-05-09 DIAGNOSIS — R0681 Apnea, not elsewhere classified: Secondary | ICD-10-CM | POA: Diagnosis not present

## 2016-05-09 DIAGNOSIS — R0683 Snoring: Secondary | ICD-10-CM | POA: Diagnosis not present

## 2016-05-09 LAB — LIPID PANEL
CHOLESTEROL: 206 mg/dL — AB (ref 0–200)
HDL: 86.1 mg/dL (ref >39.00)
LDL Cholesterol: 94 mg/dL (ref 0–99)
NONHDL: 119.55
Total CHOL/HDL Ratio: 2
Triglycerides: 126 mg/dL (ref 0.0–149.0)
VLDL: 25.2 mg/dL (ref 0.0–40.0)

## 2016-05-09 LAB — COMPREHENSIVE METABOLIC PANEL
ALT: 13 U/L (ref 0–35)
AST: 15 U/L (ref 0–37)
Albumin: 4.1 g/dL (ref 3.5–5.2)
Alkaline Phosphatase: 68 U/L (ref 39–117)
BUN: 22 mg/dL (ref 6–23)
CALCIUM: 9.2 mg/dL (ref 8.4–10.5)
CHLORIDE: 106 meq/L (ref 96–112)
CO2: 30 mEq/L (ref 19–32)
Creatinine, Ser: 1.16 mg/dL (ref 0.40–1.20)
GFR: 49.15 mL/min — AB (ref >60.00)
GLUCOSE: 92 mg/dL (ref 70–99)
Potassium: 4.1 mEq/L (ref 3.5–5.1)
Sodium: 140 mEq/L (ref 135–145)
Total Bilirubin: 0.4 mg/dL (ref 0.2–1.2)
Total Protein: 6.3 g/dL (ref 6.0–8.3)

## 2016-05-09 LAB — LDL CHOLESTEROL, DIRECT: LDL DIRECT: 105 mg/dL

## 2016-05-09 LAB — POCT GLYCOSYLATED HEMOGLOBIN (HGB A1C): HEMOGLOBIN A1C: 5.5

## 2016-05-09 LAB — MICROALBUMIN / CREATININE URINE RATIO
Creatinine,U: 128.6 mg/dL
MICROALB/CREAT RATIO: 0.9 mg/g (ref 0.0–30.0)
Microalb, Ur: 1.1 mg/dL (ref 0.0–1.9)

## 2016-05-09 LAB — MAGNESIUM: MAGNESIUM: 1.9 mg/dL (ref 1.5–2.5)

## 2016-05-09 LAB — TSH: TSH: 4.44 u[IU]/mL (ref 0.35–4.50)

## 2016-05-09 MED ORDER — HYDROCODONE-ACETAMINOPHEN 10-325 MG PO TABS: ORAL_TABLET | ORAL | 0 refills | Status: DC

## 2016-05-09 MED ORDER — AMITRIPTYLINE HCL 50 MG PO TABS: 50.0000 mg | ORAL_TABLET | Freq: Every day | ORAL | 1 refills | Status: DC

## 2016-05-09 NOTE — Patient Instructions (Signed)
Your pulse is irregular today due to Premature atrial contractions  This may be a sign of irritability due to various causes including sleep apnea  I have ordered a sleep study   Return in 3 months

## 2016-05-09 NOTE — Progress Notes (Signed)
Pre visit review using our clinic review tool, if applicable. No additional management support is needed unless otherwise documented below in the visit note. 

## 2016-05-09 NOTE — Assessment & Plan Note (Addendum)
12 lead ekg was done today to evaluate irregular pulse and reviewed by me.  She is having frequent PAC's . Patient  Has occasional epiosdes of dizziness caused by changes in position, and untreated OSA.   Lab Results  Component Value Date   TSH 4.44 05/09/2016   Lab Results  Component Value Date   NA 140 05/09/2016   K 4.1 05/09/2016   CL 106 05/09/2016   CO2 30 05/09/2016

## 2016-05-09 NOTE — Progress Notes (Signed)
Subjective:  Patient ID: Jodi [name], female    DOB: Jun 14, 1945  Age: 71 y.o. MRN: [id number]  CC: The primary encounter diagnosis was Witnessed apneic spells. Diagnoses of Diabetes mellitus without complication ([place]), Irregular heart beat, Atrial premature depolarization, Snoring, Hyperlipidemia associated with type 2 diabetes mellitus ([place]), Visit for screening mammogram, and OSA (obstructive sleep apnea) were also pertinent to this visit.  HPI Jodi [name] presents for 3 month follow up on diabetes.  Patient has no complaints today.  Patient is following a low glycemic index diet and taking all prescribed medications regularly without side effects.  Fasting sugars have been under less than 140 most of the time and post prandials have been under 160 except on rare occasions. Patient is exercising about 3 times per week and intentionally trying to lose weight .  Patient has had an eye exam in the last 12 months and checks feet regularly for signs of infection.  Patient does not walk barefoot outside,  And denies an numbness tingling or burning in feet. Patient is up to date on all recommended vaccinations  Noted by CMA to have irregular pulse as low as  Mid 30's.  Has frequent dizzy spells caused by position change.  Has history of OSA no longer treated since she had weight loss via bariatric surgery,  Sleeps until 11:00 am .  Still snores son has witnessed apnea. .   Bilateral knee pain aggravated by prolonged sitting  Wt gain of 8 lbs since January ,  Being less active lately . Aggravated by son  And girlfriend freeloading  For the last several months off of her  .   Foot exam normal  Due for exam eye   Lab Results  Component Value Date   MICROALBUR 1.1 05/09/2016  `   Outpatient Medications Prior to Visit  Medication Sig Dispense Refill  . amoxicillin (AMOXIL) 500 MG capsule Take 4 capsules by mouth daily.    Marland Kitchen buPROPion (WELLBUTRIN SR) 100 MG 12 hr tablet TAKE 1 TABLET (100  MG TOTAL) BY MOUTH 2 (TWO) TIMES DAILY. 60 tablet 2  . clonazePAM (KLONOPIN) 0.5 MG tablet Take 1 tablet (0.5 mg total) by mouth 2 (two) times daily as needed for anxiety. 60 tablet 2  . hydrochlorothiazide (HYDRODIURIL) 25 MG tablet TAKE 1 TABLET (25 MG TOTAL) BY MOUTH DAILY. 90 tablet 1  . meloxicam (MOBIC) 15 MG tablet TAKE 1 TABLET (15 MG TOTAL) BY MOUTH DAILY AS NEEDED FOR JOINT PAIN 90 tablet 2  . omeprazole (PRILOSEC) 20 MG capsule Take 1 capsule (20 mg total) by mouth daily. 30 capsule 3  . sertraline (ZOLOFT) 100 MG tablet TAKE 1 TABLET (100 MG TOTAL) BY MOUTH DAILY. 90 tablet 3  . Tdap (BOOSTRIX) 5-2.5-18.5 LF-MCG/0.5 injection Inject 0.5 mLs into the muscle once. 0.5 mL 0  . traMADol (ULTRAM) 50 MG tablet TAKE 2 TABS BY MOUTH EVERY 8 HOURS AS NEEDED FOR PAIN 180 tablet 2  . traZODone (DESYREL) 100 MG tablet TAKE 1 TABLET (100 MG TOTAL) BY MOUTH 2 (TWO) TIMES DAILY. 60 tablet 2  . amitriptyline (ELAVIL) 25 MG tablet TAKE 2 TABLETS (50 MG TOTAL) BY MOUTH AT BEDTIME. 60 tablet 2  . HYDROcodone-acetaminophen (NORCO) 10-325 MG tablet TAKE 1 TABLET BY MOUTH EVERY 8 HOURS AS NEEDED FOR PAIN, MAXIMUM 2 DAILY 60 tablet 0  . HYDROcodone-acetaminophen (NORCO) 10-325 MG tablet TAKE 1 TABLET BY MOUTH EVERY 8 HOURS AS NEEDED FOR PAIN, MAXIMUM 2 DAILY 60 tablet 0  No facility-administered medications prior to visit.     Review of Systems;  Patient denies headache, fevers, malaise, unintentional weight loss, skin rash, eye pain, sinus congestion and sinus pain, sore throat, dysphagia,  hemoptysis , cough, dyspnea, wheezing, chest pain, palpitations, orthopnea, edema, abdominal pain, nausea, melena, diarrhea, constipation, flank pain, dysuria, hematuria, urinary  Frequency, nocturia, numbness, tingling, seizures,  Focal weakness, Loss of consciousness,  Tremor, insomnia, depression, anxiety, and suicidal ideation.      Objective:  BP (!) 150/84 (BP Location: Left Arm, Patient Position: Sitting,  Cuff Size: Normal)   Pulse (!) 58   Temp 98 F (36.7 C) (Oral)   Resp 16   Wt 185 lb 2 oz (84 kg)   SpO2 98%   BMI 30.81 kg/m   BP Readings from Last 3 Encounters:  05/09/16 (!) 150/84  02/07/16 (!) 142/90  07/26/15 138/76    Wt Readings from Last 3 Encounters:  05/09/16 185 lb 2 oz (84 kg)  02/07/16 177 lb (80.3 kg)  07/26/15 181 lb 4 oz (82.2 kg)    General appearance: alert, cooperative and appears stated age Ears: normal TM's and external ear canals both ears Throat: lips, mucosa, and tongue normal; teeth and gums normal Neck: no adenopathy, no carotid bruit, supple, symmetrical, trachea midline and thyroid not enlarged, symmetric, no tenderness/mass/nodules Back: symmetric, no curvature. ROM normal. No CVA tenderness. Lungs: clear to auscultation bilaterally Heart: regular rate and rhythm, S1, S2 normal, no murmur, click, rub or gallop Abdomen: soft, non-tender; bowel sounds normal; no masses,  no organomegaly Pulses: 2+ and symmetric Skin: Skin color, texture, turgor normal. No rashes or lesions Lymph nodes: Cervical, supraclavicular, and axillary nodes normal.  Lab Results  Component Value Date   HGBA1C 5.5 05/09/2016   HGBA1C 5.7 02/07/2016   HGBA1C 5.7 07/26/2015    Lab Results  Component Value Date   CREATININE 1.16 05/09/2016   CREATININE 1.13 02/07/2016   CREATININE 1.12 07/26/2015    Lab Results  Component Value Date   WBC 5.5 02/10/2015   HGB 12.5 02/10/2015   HCT 38.0 02/10/2015   PLT 245.0 02/10/2015   GLUCOSE 92 05/09/2016   CHOL 206 (H) 05/09/2016   TRIG 126.0 05/09/2016   HDL 86.10 05/09/2016   LDLDIRECT 105.0 05/09/2016   LDLCALC 94 05/09/2016   ALT 13 05/09/2016   AST 15 05/09/2016   NA 140 05/09/2016   K 4.1 05/09/2016   CL 106 05/09/2016   CREATININE 1.16 05/09/2016   BUN 22 05/09/2016   CO2 30 05/09/2016   TSH 4.44 05/09/2016   HGBA1C 5.5 05/09/2016   MICROALBUR 1.1 05/09/2016    Assessment & Plan:   Problem List Items  Addressed This Visit    Arrhythmia    12 lead ekg was done today to evaluate irregular pulse and reviewed by me.  She is having frequent PAC's . Patient  Has occasional epiosdes of dizziness caused by changes in position, and untreated OSA.   Lab Results  Component Value Date   TSH 4.44 05/09/2016   Lab Results  Component Value Date   NA 140 05/09/2016   K 4.1 05/09/2016   CL 106 05/09/2016   CO2 30 05/09/2016          Diabetes mellitus without complication ([place])     Historically well-controlled on diet alone since her gastric bypass  In 2011.  hemoglobin A1c has been consistently at or  less than 6.0 .  Patient is NOT up-to-date on eye  exams and foot exam is normal today. Patient has no microalbuminuria. Patient is tolerating statin therapy for CAD risk reduction and on ACE/ARB for renal protection and hypertension  Lab Results  Component Value Date   HGBA1C 5.5 05/09/2016   Lab Results  Component Value Date   MICROALBUR 1.1 05/09/2016             Relevant Orders   POCT glycosylated hemoglobin (Hb A1C) (Completed)   Comprehensive metabolic panel (Completed)   Lipid panel (Completed)   Microalbumin / creatinine urine ratio (Completed)   OSA (obstructive sleep apnea)    Untreated since diagnosis several years ago,  With recent apneic events noted by son.  Repeat sleep study ordered       Snoring   Relevant Orders   Nocturnal polysomnography (NPSG)    Other Visit Diagnoses    Witnessed apneic spells    -  Primary   Relevant Orders   Nocturnal polysomnography (NPSG)   Irregular heart beat       Relevant Orders   EKG 12-Lead (Completed)   Nocturnal polysomnography (NPSG)   Magnesium (Completed)   TSH (Completed)   Hyperlipidemia associated with type 2 diabetes mellitus ([place])       Relevant Orders   LDL cholesterol, direct (Completed)   Lipid panel (Completed)   Visit for screening mammogram       Relevant Orders   MM Digital Screening      I have  discontinued Ms. Burgio's amitriptyline. I am also having her start on amitriptyline. Additionally, I am having her maintain her amoxicillin, Tdap, sertraline, omeprazole, hydrochlorothiazide, clonazePAM, meloxicam, traMADol, buPROPion, traZODone, HYDROcodone-acetaminophen, HYDROcodone-acetaminophen, and HYDROcodone-acetaminophen.  Meds ordered this encounter  Medications  . amitriptyline (ELAVIL) 50 MG tablet    Sig: Take 1 tablet (50 mg total) by mouth at bedtime.    Dispense:  90 tablet    Refill:  1  . HYDROcodone-acetaminophen (NORCO) 10-325 MG tablet    Sig: TAKE 1 TABLET BY MOUTH EVERY 8 HOURS AS NEEDED FOR PAIN, MAXIMUM 2 DAILY    Dispense:  60 tablet    Refill:  0    MAY REFILL ON OR AFTER May 09 2016  . HYDROcodone-acetaminophen (NORCO) 10-325 MG tablet    Sig: TAKE 1 TABLET BY MOUTH EVERY 8 HOURS AS NEEDED FOR PAIN, MAXIMUM 2 DAILY    Dispense:  60 tablet    Refill:  0    MAY REFILL ON OR AFTER Jun 09 2016  . HYDROcodone-acetaminophen (NORCO) 10-325 MG tablet    Sig: TAKE 1 TABLET BY MOUTH EVERY 8 HOURS AS NEEDED FOR PAIN, MAXIMUM 2 DAILY    Dispense:  60 tablet    Refill:  0    May refill on or after July 09 2016    Medications Discontinued During This Encounter  Medication Reason  . amitriptyline (ELAVIL) 25 MG tablet   . HYDROcodone-acetaminophen (NORCO) 10-325 MG tablet Reorder  . HYDROcodone-acetaminophen (NORCO) 10-325 MG tablet Reorder    Follow-up: Return in about 3 months (around 08/09/2016) for follow up diabetes.   [name], MD

## 2016-05-11 NOTE — Assessment & Plan Note (Signed)
Untreated since diagnosis several years ago,  With recent apneic events noted by son.  Repeat sleep study ordered

## 2016-05-11 NOTE — Assessment & Plan Note (Signed)
Historically well-controlled on diet alone since her gastric bypass  In 2011.  hemoglobin A1c has been consistently at or  less than 6.0 .  Patient is NOT up-to-date on eye exams and foot exam is normal today. Patient has no microalbuminuria. Patient is tolerating statin therapy for CAD risk reduction and on ACE/ARB for renal protection and hypertension  Lab Results  Component Value Date   HGBA1C 5.5 05/09/2016   Lab Results  Component Value Date   MICROALBUR 1.1 05/09/2016

## 2016-05-13 ENCOUNTER — Other Ambulatory Visit: Payer: Self-pay | Admitting: Internal Medicine

## 2016-05-13 ENCOUNTER — Telehealth: Payer: Self-pay

## 2016-05-13 MED ORDER — SERTRALINE HCL 100 MG PO TABS: ORAL_TABLET | ORAL | 1 refills | Status: DC

## 2016-05-13 NOTE — Telephone Encounter (Signed)
-----   Message from [name], MD sent at 05/12/2016  9:00 AM EDT ----- Disregard 2nd half of message,  No labs needed at next visit

## 2016-05-13 NOTE — Telephone Encounter (Signed)
Spoke with pt and informed her of her lab results. Pt also needed a refill of her sertraline so the refill was sent in.

## 2016-05-15 NOTE — Telephone Encounter (Signed)
Scheduled 05/23/16

## 2016-05-23 ENCOUNTER — Ambulatory Visit (INDEPENDENT_AMBULATORY_CARE_PROVIDER_SITE_OTHER): Payer: Medicare Other

## 2016-05-23 VITALS — BP 136/72 | HR 71 | Temp 98.0°F | Resp 14 | Ht 65.0 in | Wt 184.0 lb

## 2016-05-23 DIAGNOSIS — Z0000 Encounter for general adult medical examination without abnormal findings: Secondary | ICD-10-CM

## 2016-05-23 DIAGNOSIS — Z1211 Encounter for screening for malignant neoplasm of colon: Secondary | ICD-10-CM | POA: Diagnosis not present

## 2016-05-23 DIAGNOSIS — Z1389 Encounter for screening for other disorder: Secondary | ICD-10-CM

## 2016-05-23 NOTE — Progress Notes (Signed)
Subjective:   Jodi [name] is a 71 y.o. female who presents for Medicare Annual (Subsequent) preventive examination.  Review of Systems:  No ROS.  Medicare Wellness Visit. Cardiac Risk Factors include: advanced age (>42men, >2 women);diabetes mellitus;obesity (BMI >30kg/m2);hypertension     Objective:     Vitals: BP 136/72 (BP Location: Left Arm, Patient Position: Sitting, Cuff Size: Normal)   Pulse 71   Temp 98 F (36.7 C) (Oral)   Resp 14   Ht 5\' 5"  (1.651 m)   Wt 184 lb (83.5 kg)   SpO2 98%   BMI 30.62 kg/m   Body mass index is 30.62 kg/m.   Tobacco History  Smoking Status  . Never Smoker  Smokeless Tobacco  . Never Used     Counseling given: Not Answered   Past Medical History:  Diagnosis Date  . Anxiety   . Arthritis   . Diabetes mellitus without complication ([place])    diet controlled since gastric bypass   Past Surgical History:  Procedure Laterality Date  . BLADDER SUSPENSION    . CHOLECYSTECTOMY    . JOINT REPLACEMENT     left hip, right knee  . ROUX-EN-Y PROCEDURE  2010  . tracheotomy     at age 43 due to respiratroy failure   Family History  Problem Relation Age of Onset  . COPD Mother   . Hypertension Mother   . Ovarian cancer Mother 38       ovarian CA  . Breast cancer Daughter   . Breast cancer Paternal Aunt   . Breast cancer Paternal Grandmother    History  Sexual Activity  . Sexual activity: Not Currently    Outpatient Encounter Prescriptions as of 05/23/2016  Medication Sig  . amitriptyline (ELAVIL) 50 MG tablet Take 1 tablet (50 mg total) by mouth at bedtime.  Marland Kitchen amoxicillin (AMOXIL) 500 MG capsule Take 4 capsules by mouth daily.  Marland Kitchen buPROPion (WELLBUTRIN SR) 100 MG 12 hr tablet TAKE 1 TABLET (100 MG TOTAL) BY MOUTH 2 (TWO) TIMES DAILY.  . clonazePAM (KLONOPIN) 0.5 MG tablet TAKE 1 TABLET BY MOUTH 2 TIMES A DAY AS NEEDED FOR ANXIETY  . hydrochlorothiazide (HYDRODIURIL) 25 MG tablet TAKE 1 TABLET (25 MG TOTAL) BY MOUTH DAILY.    Marland Kitchen HYDROcodone-acetaminophen (NORCO) 10-325 MG tablet TAKE 1 TABLET BY MOUTH EVERY 8 HOURS AS NEEDED FOR PAIN, MAXIMUM 2 DAILY  . HYDROcodone-acetaminophen (NORCO) 10-325 MG tablet TAKE 1 TABLET BY MOUTH EVERY 8 HOURS AS NEEDED FOR PAIN, MAXIMUM 2 DAILY  . meloxicam (MOBIC) 15 MG tablet TAKE 1 TABLET (15 MG TOTAL) BY MOUTH DAILY AS NEEDED FOR JOINT PAIN  . omeprazole (PRILOSEC) 20 MG capsule Take 1 capsule (20 mg total) by mouth daily.  . sertraline (ZOLOFT) 100 MG tablet TAKE 1 TABLET (100 MG TOTAL) BY MOUTH DAILY.  . Tdap (BOOSTRIX) 5-2.5-18.5 LF-MCG/0.5 injection Inject 0.5 mLs into the muscle once.  . traMADol (ULTRAM) 50 MG tablet TAKE 2 TABS BY MOUTH EVERY 8 HOURS AS NEEDED FOR PAIN  . traZODone (DESYREL) 100 MG tablet TAKE 1 TABLET (100 MG TOTAL) BY MOUTH 2 (TWO) TIMES DAILY.  . [DISCONTINUED] HYDROcodone-acetaminophen (NORCO) 10-325 MG tablet TAKE 1 TABLET BY MOUTH EVERY 8 HOURS AS NEEDED FOR PAIN, MAXIMUM 2 DAILY   No facility-administered encounter medications on file as of 05/23/2016.     Activities of Daily Living In your present state of health, do you have any difficulty performing the following activities: 05/23/2016  Hearing? N  Vision? N  Difficulty concentrating or making decisions? N  Walking or climbing stairs? Y  Dressing or bathing? N  Doing errands, shopping? N  Preparing Food and eating ? N  Using the Toilet? N  In the past six months, have you accidently leaked urine? Y  Do you have problems with loss of bowel control? N  Managing your Medications? N  Managing your Finances? N  Housekeeping or managing your Housekeeping? N  Some recent data might be hidden    Patient Care Team: [name], MD as PCP - General (Internal Medicine)    Assessment:    This is a routine wellness examination for Jodi [name]. The goal of the wellness visit is to assist the patient how to close the gaps in care and create a preventative care plan for the patient.   Taking calcium  VIT D as appropriate/Osteoporosis risk reviewed.  Medications reviewed; taking without issues or barriers.  Safety issues reviewed; smoke detectors in the home. No firearms in the home.  Wears seatbelts when driving or riding with others. Patient does wear sunscreen or protective clothing when in direct sunlight. No violence in the home.  Depression- PHQ 2 &9 complete.  No signs/symptoms or verbal communication regarding little pleasure in doing things.  Feeling down, depressed or hopeless regarding her son and his attitude. No changes in sleeping, energy, concentrating.  Eating habits have decreased since ROUX-EN-Y procedure. No thoughts of self harm or harm towards others.  Time spent on this topic is 10 minutes.   Patient is alert, normal appearance, oriented to person/place/and time. Correctly identified the president of the [place], recall of 3/3 words, and some difficulty performing simple calculations.  Patient displays appropriate judgement and can read correct time from watch face.  No new identified risk were noted.  No failures at ADL's or IADL's.   BMI- discussed the importance of a healthy diet, water intake and exercise. Educational material provided.   24 hour diet recall: Breakfast: Ham, eggs Lunch: Cottage cheese, orange Dinner: Pot pie Daily fluid intake: 0 cups of caffeine, 1 cups of water Encouraged to increase water intake  HTN- followed by PCP.  Dental- every six months.  Eye- Visual acuity not assessed per patient preference since they have regular follow up with the ophthalmologist.  Wears corrective lenses.  Sleep patterns- Sleeps 7-8 hours at night.  Wakes feeling rested.    TDAP vaccine deferred per patient preference.  Follow up with insurance.  Educational material provided.  Coloscopy discussed and ordered. Educational material provided.    Patient Concerns: None at this time. Follow up with PCP as needed.  Exercise Activities and Dietary  recommendations Current Exercise Habits: Home exercise routine, Type of exercise: walking, Time (Minutes): 20, Frequency (Times/Week): 4, Weekly Exercise (Minutes/Week): 80, Intensity: Mild  Goals    . Increase physical activity          Start Silver Sneaker program.  Go to the gym 2-3 time weekly, 30 minutes. Increase as tolerated.      Fall Risk Fall Risk  05/23/2016 04/26/2015 02/10/2015 04/22/2013 03/09/2012  Falls in the past year? Yes No No Yes Yes  Number falls in past yr: 1 - - 2 or more 1  Injury with Fall? No - - - -  Risk for fall due to : - - - Other (Comment) Other (Comment)  Risk for fall due to (comments): - - - outside working -  Follow up Falls prevention discussed - - - -  Depression Screen PHQ 2/9 Scores 05/23/2016 04/26/2015 02/10/2015 04/22/2013  PHQ - 2 Score 1 0 0 0  PHQ- 9 Score 3 - - -     Cognitive Function MMSE - Mini Mental State Exam 05/23/2016  Orientation to time 5  Orientation to Place 5  Registration 3  Attention/ Calculation 2  Attention/Calculation-comments Difficulty performing simple calculations  Recall 3  Language- name 2 objects 2  Language- repeat 1  Language- follow 3 step command 3  Language- read & follow direction 1  Write a sentence 1  Copy design 1  Total score 27        Immunization History  Administered Date(s) Administered  . Influenza, High Dose Seasonal PF 12/08/2012, 02/10/2015, 02/07/2016  . Pneumococcal Conjugate-13 12/08/2012  . Pneumococcal Polysaccharide-23 10/22/2008, 02/10/2015  . Zoster 12/08/2009   Screening Tests Health Maintenance  Topic Date Due  . TETANUS/TDAP  10/26/1965  . OPHTHALMOLOGY EXAM  12/16/2013  . COLONOSCOPY  04/10/2014  . MAMMOGRAM  07/24/2016  . INFLUENZA VACCINE  08/07/2016  . HEMOGLOBIN A1C  11/09/2016  . FOOT EXAM  02/06/2017  . URINE MICROALBUMIN  05/09/2017  . DEXA SCAN  Completed  . Hepatitis C Screening  Completed  . PNA vac Low Risk Adult  Completed      Plan:   End of life  planning; Advanced aging; Advanced directives discussed.  No HCPOA/Living Will.  Additional information provided to help them start the conversation with family.  Copy of HCPOA/Living Will requested upon completion. Time spent on this topic is 20 minutes.  I have personally reviewed and noted the following in the patient's chart:   . Medical and social history . Use of alcohol, tobacco or illicit drugs  . Current medications and supplements . Functional ability and status . Nutritional status . Physical activity . Advanced directives . List of other physicians . Hospitalizations, surgeries, and ER visits in previous 12 months . Vitals . Screenings to include cognitive, depression, and falls . Referrals and appointments  In addition, I have reviewed and discussed with patient certain preventive protocols, quality metrics, and best practice recommendations. A written personalized care plan for preventive services as well as general preventive health recommendations were provided to patient.     Varney Biles, LPN  3/76/2831

## 2016-05-23 NOTE — Patient Instructions (Addendum)
Jodi [name] , Thank you for taking time to come for your Medicare Wellness Visit. I appreciate your ongoing commitment to your health goals. Please review the following plan we discussed and let me know if I can assist you in the future.   ADD SMOKE DETECTORS IN THE HOME.  Local fire department may assist.  Follow up with Dr. Derrel Nip as needed.    Bring a copy of you [place] and/or Living Will to be scanned into chart once completed.  Colonoscopy ordered, follow as directed.  Have a great day!  These are the goals we discussed: Goals    . Increase physical activity          Start Silver Sneaker program.  Go to the gym 2-3 time weekly, 30 minutes. Increase as tolerated.       This is a list of the screening recommended for you and due dates:  Health Maintenance  Topic Date Due  . Tetanus Vaccine  10/26/1965  . Eye exam for diabetics  12/16/2013  . Colon Cancer Screening  04/10/2014  . Mammogram  07/24/2016  . Flu Shot  08/07/2016  . Hemoglobin A1C  11/09/2016  . Complete foot exam   02/06/2017  . Urine Protein Check  05/09/2017  . DEXA scan (bone density measurement)  Completed  .  Hepatitis C: One time screening is recommended by Center for Disease Control  (CDC) for  adults born from [birth year] through 1964.   Completed  . Pneumonia vaccines  Completed      Colonoscopy, Adult A colonoscopy is an exam to look at the entire large intestine. During the exam, a lubricated, bendable tube is inserted into the anus and then passed into the rectum, colon, and other parts of the large intestine. A colonoscopy is often done as a part of normal colorectal screening or in response to certain symptoms, such as anemia, persistent diarrhea, abdominal pain, and blood in the stool. The exam can help screen for and diagnose medical problems, including:  Tumors.  Polyps.  Inflammation.  Areas of bleeding. Tell a health care provider about:  Any allergies you  have.  All medicines you are taking, including vitamins, herbs, eye drops, creams, and over-the-counter medicines.  Any problems you or family members have had with anesthetic medicines.  Any blood disorders you have.  Any surgeries you have had.  Any medical conditions you have.  Any problems you have had passing stool. What are the risks? Generally, this is a safe procedure. However, problems may occur, including:  Bleeding.  A tear in the intestine.  A reaction to medicines given during the exam.  Infection (rare). What happens before the procedure? Eating and drinking restrictions  Follow instructions from your health care provider about eating and drinking, which may include:  A few days before the procedure - follow a low-fiber diet. Avoid nuts, seeds, dried fruit, raw fruits, and vegetables.  1-3 days before the procedure - follow a clear liquid diet. Drink only clear liquids, such as clear broth or bouillon, black coffee or tea, clear juice, clear soft drinks or sports drinks, gelatin dessert, and popsicles. Avoid any liquids that contain red or purple dye.  On the day of the procedure - do not eat or drink anything during the 2 hours before the procedure, or within the time period that your health care provider recommends. Bowel prep  If you were prescribed an oral bowel prep to clean out your colon:  Take  it as told by your health care provider. Starting the day before your procedure, you will need to drink a large amount of medicated liquid. The liquid will cause you to have multiple loose stools until your stool is almost clear or light green.  If your skin or anus gets irritated from diarrhea, you may use these to relieve the irritation:  Medicated wipes, such as adult wet wipes with aloe and vitamin E.  A skin soothing-product like petroleum jelly.  If you vomit while drinking the bowel prep, take a break for up to 60 minutes and then begin the bowel prep  again. If vomiting continues and you cannot take the bowel prep without vomiting, call your health care provider. General instructions   Ask your health care provider about changing or stopping your regular medicines. This is especially important if you are taking diabetes medicines or blood thinners.  Plan to have someone take you home from the hospital or clinic. What happens during the procedure?  An IV tube may be inserted into one of your veins.  You will be given medicine to help you relax (sedative).  To reduce your risk of infection:  Your health care team will wash or sanitize their hands.  Your anal area will be washed with soap.  You will be asked to lie on your side with your knees bent.  Your health care provider will lubricate a long, thin, flexible tube. The tube will have a camera and a light on the end.  The tube will be inserted into your anus.  The tube will be gently eased through your rectum and colon.  Air will be delivered into your colon to keep it open. You may feel some pressure or cramping.  The camera will be used to take images during the procedure.  A small tissue sample may be removed from your body to be examined under a microscope (biopsy). If any potential problems are found, the tissue will be sent to a lab for testing.  If small polyps are found, your health care provider may remove them and have them checked for cancer cells.  The tube that was inserted into your anus will be slowly removed. The procedure may vary among health care providers and hospitals. What happens after the procedure?  Your blood pressure, heart rate, breathing rate, and blood oxygen level will be monitored until the medicines you were given have worn off.  Do not drive for 24 hours after the exam.  You may have a small amount of blood in your stool.  You may pass gas and have mild abdominal cramping or bloating due to the air that was used to inflate your colon  during the exam.  It is up to you to get the results of your procedure. Ask your health care provider, or the department performing the procedure, when your results will be ready. This information is not intended to replace advice given to you by your health care provider. Make sure you discuss any questions you have with your health care provider. Document Released: 12/22/1999 Document Revised: 10/25/2015 Document Reviewed: 03/07/2015 Elsevier Interactive Patient Education  2017 [employer].

## 2016-05-28 NOTE — Progress Notes (Signed)
  I have reviewed the above information and agree with above.   Shweta Aman, MD 

## 2016-05-29 ENCOUNTER — Ambulatory Visit
Admission: RE | Admit: 2016-05-29 | Discharge: 2016-05-29 | Disposition: A | Discharge status: Home / Self Care | Payer: Medicare Other | Source: Ambulatory Visit | Attending: Internal Medicine | Admitting: Internal Medicine

## 2016-05-29 ENCOUNTER — Ambulatory Visit: Payer: Medicare Other

## 2016-05-29 DIAGNOSIS — Z1231 Encounter for screening mammogram for malignant neoplasm of breast: Secondary | ICD-10-CM | POA: Diagnosis not present

## 2016-05-29 DIAGNOSIS — G4733 Obstructive sleep apnea (adult) (pediatric): Secondary | ICD-10-CM | POA: Diagnosis not present

## 2016-05-29 DIAGNOSIS — G4761 Periodic limb movement disorder: Secondary | ICD-10-CM | POA: Diagnosis not present

## 2016-06-07 ENCOUNTER — Other Ambulatory Visit: Payer: Self-pay | Admitting: Internal Medicine

## 2016-06-10 ENCOUNTER — Telehealth: Payer: Self-pay | Admitting: Internal Medicine

## 2016-06-10 DIAGNOSIS — G4733 Obstructive sleep apnea (adult) (pediatric): Secondary | ICD-10-CM

## 2016-06-10 NOTE — Telephone Encounter (Signed)
Sleep study confirmed that patient has sleep apnea, CPAP ordered

## 2016-06-10 NOTE — Telephone Encounter (Signed)
Attempted to call pt. No answer no voicemail.  

## 2016-06-13 NOTE — Telephone Encounter (Signed)
Attempted to call pt. No answer no voicemail.  

## 2016-06-13 NOTE — Telephone Encounter (Signed)
Spoke with pt and notified her that she does have sleep apnea and that the cpap machine has been ordered for her. Pt gave a verbal understanding.

## 2016-06-14 ENCOUNTER — Other Ambulatory Visit: Payer: Self-pay

## 2016-06-14 ENCOUNTER — Telehealth: Payer: Self-pay

## 2016-06-14 DIAGNOSIS — Z1211 Encounter for screening for malignant neoplasm of colon: Secondary | ICD-10-CM

## 2016-06-14 DIAGNOSIS — Z1212 Encounter for screening for malignant neoplasm of rectum: Principal | ICD-10-CM

## 2016-06-14 NOTE — Telephone Encounter (Signed)
Gastroenterology Pre-Procedure Review  Request Date: 7/10 Requesting Physician: Dr. Vicente Males  PATIENT REVIEW QUESTIONS: The patient responded to the following health history questions as indicated:    1. Are you having any GI issues? no 2. Do you have a personal history of Polyps? no 3. Do you have a family history of Colon Cancer or Polyps? no 4. Diabetes Mellitus? yes (Type II) 5. Joint replacements in the past 12 months?no 6. Major health problems in the past 3 months?no 7. Any artificial heart valves, MVP, or defibrillator?no    MEDICATIONS & ALLERGIES:    Patient reports the following regarding taking any anticoagulation/antiplatelet therapy:   Plavix, Coumadin, Eliquis, Xarelto, Lovenox, Pradaxa, Brilinta, or Effient? no Aspirin? no  Patient confirms/reports the following medications:  Current Outpatient Prescriptions  Medication Sig Dispense Refill  . amitriptyline (ELAVIL) 25 MG tablet     . amitriptyline (ELAVIL) 50 MG tablet Take 1 tablet (50 mg total) by mouth at bedtime. 90 tablet 1  . amoxicillin (AMOXIL) 500 MG capsule Take 4 capsules by mouth daily.    Marland Kitchen buPROPion (WELLBUTRIN SR) 100 MG 12 hr tablet TAKE 1 TABLET (100 MG TOTAL) BY MOUTH 2 (TWO) TIMES DAILY. 60 tablet 2  . clonazePAM (KLONOPIN) 0.5 MG tablet TAKE 1 TABLET BY MOUTH 2 TIMES A DAY AS NEEDED FOR ANXIETY 60 tablet 2  . hydrochlorothiazide (HYDRODIURIL) 25 MG tablet TAKE 1 TABLET (25 MG TOTAL) BY MOUTH DAILY. 90 tablet 1  . HYDROcodone-acetaminophen (NORCO) 10-325 MG tablet TAKE 1 TABLET BY MOUTH EVERY 8 HOURS AS NEEDED FOR PAIN, MAXIMUM 2 DAILY 60 tablet 0  . HYDROcodone-acetaminophen (NORCO) 10-325 MG tablet TAKE 1 TABLET BY MOUTH EVERY 8 HOURS AS NEEDED FOR PAIN, MAXIMUM 2 DAILY 60 tablet 0  . meloxicam (MOBIC) 15 MG tablet TAKE 1 TABLET (15 MG TOTAL) BY MOUTH DAILY AS NEEDED FOR JOINT PAIN 90 tablet 2  . omeprazole (PRILOSEC) 20 MG capsule Take 1 capsule (20 mg total) by mouth daily. 30 capsule 3  .  sertraline (ZOLOFT) 100 MG tablet TAKE 1 TABLET (100 MG TOTAL) BY MOUTH DAILY. 90 tablet 1  . Tdap (BOOSTRIX) 5-2.5-18.5 LF-MCG/0.5 injection Inject 0.5 mLs into the muscle once. 0.5 mL 0  . traMADol (ULTRAM) 50 MG tablet TAKE 2 TABLETS BY MOUTH EVERY 8 HOURS AS NEEDED FOR PAIN 180 tablet 1  . traZODone (DESYREL) 100 MG tablet TAKE 1 TABLET (100 MG TOTAL) BY MOUTH 2 (TWO) TIMES DAILY. 60 tablet 2   No current facility-administered medications for this visit.     Patient confirms/reports the following allergies:  Allergies  Allergen Reactions  . Morphine And Related Nausea And Vomiting    No orders of the defined types were placed in this encounter.   AUTHORIZATION INFORMATION Primary Insurance: 1D#: Group #:  Secondary Insurance: 1D#: Group #:  SCHEDULE INFORMATION: Date: 7/10 Time: Location: [place]

## 2016-06-18 ENCOUNTER — Other Ambulatory Visit: Payer: Self-pay | Admitting: Internal Medicine

## 2016-07-07 ENCOUNTER — Other Ambulatory Visit: Payer: Self-pay | Admitting: Internal Medicine

## 2016-07-15 ENCOUNTER — Encounter: Payer: Self-pay | Admitting: *Deleted

## 2016-07-16 ENCOUNTER — Ambulatory Visit
Admission: RE | Admit: 2016-07-16 | Discharge: 2016-07-16 | Disposition: A | Discharge status: Home / Self Care | Payer: Medicare Other | Source: Ambulatory Visit | Attending: Gastroenterology | Admitting: Gastroenterology

## 2016-07-16 ENCOUNTER — Encounter
Admission: RE | Disposition: A | Discharge status: Home / Self Care | Payer: Self-pay | Source: Ambulatory Visit | Attending: Gastroenterology

## 2016-07-16 ENCOUNTER — Encounter: Payer: Self-pay | Admitting: *Deleted

## 2016-07-16 ENCOUNTER — Ambulatory Visit: Payer: Medicare Other | Admitting: Certified Registered"

## 2016-07-16 DIAGNOSIS — D128 Benign neoplasm of rectum: Secondary | ICD-10-CM | POA: Diagnosis not present

## 2016-07-16 DIAGNOSIS — I10 Essential (primary) hypertension: Secondary | ICD-10-CM | POA: Diagnosis not present

## 2016-07-16 DIAGNOSIS — K5790 Diverticulosis of intestine, part unspecified, without perforation or abscess without bleeding: Secondary | ICD-10-CM | POA: Diagnosis not present

## 2016-07-16 DIAGNOSIS — M1990 Unspecified osteoarthritis, unspecified site: Secondary | ICD-10-CM | POA: Diagnosis not present

## 2016-07-16 DIAGNOSIS — K5730 Diverticulosis of large intestine without perforation or abscess without bleeding: Secondary | ICD-10-CM | POA: Diagnosis not present

## 2016-07-16 DIAGNOSIS — D120 Benign neoplasm of cecum: Secondary | ICD-10-CM

## 2016-07-16 DIAGNOSIS — E119 Type 2 diabetes mellitus without complications: Secondary | ICD-10-CM | POA: Insufficient documentation

## 2016-07-16 DIAGNOSIS — G4730 Sleep apnea, unspecified: Secondary | ICD-10-CM | POA: Diagnosis not present

## 2016-07-16 DIAGNOSIS — K635 Polyp of colon: Secondary | ICD-10-CM | POA: Diagnosis not present

## 2016-07-16 DIAGNOSIS — Z96651 Presence of right artificial knee joint: Secondary | ICD-10-CM | POA: Diagnosis not present

## 2016-07-16 DIAGNOSIS — Z885 Allergy status to narcotic agent status: Secondary | ICD-10-CM | POA: Insufficient documentation

## 2016-07-16 DIAGNOSIS — Z79899 Other long term (current) drug therapy: Secondary | ICD-10-CM | POA: Insufficient documentation

## 2016-07-16 DIAGNOSIS — D122 Benign neoplasm of ascending colon: Secondary | ICD-10-CM | POA: Diagnosis not present

## 2016-07-16 DIAGNOSIS — Z9884 Bariatric surgery status: Secondary | ICD-10-CM | POA: Diagnosis not present

## 2016-07-16 DIAGNOSIS — K621 Rectal polyp: Secondary | ICD-10-CM

## 2016-07-16 DIAGNOSIS — Z1212 Encounter for screening for malignant neoplasm of rectum: Secondary | ICD-10-CM

## 2016-07-16 DIAGNOSIS — Z96642 Presence of left artificial hip joint: Secondary | ICD-10-CM | POA: Diagnosis not present

## 2016-07-16 DIAGNOSIS — F419 Anxiety disorder, unspecified: Secondary | ICD-10-CM | POA: Insufficient documentation

## 2016-07-16 DIAGNOSIS — Z1211 Encounter for screening for malignant neoplasm of colon: Secondary | ICD-10-CM

## 2016-07-16 HISTORY — DX: Essential (primary) hypertension: I10

## 2016-07-16 HISTORY — DX: Sleep apnea, unspecified: G47.30

## 2016-07-16 HISTORY — PX: COLONOSCOPY WITH PROPOFOL: SHX5780

## 2016-07-16 SURGERY — COLONOSCOPY WITH PROPOFOL
Anesthesia: General

## 2016-07-16 MED ORDER — PROPOFOL 500 MG/50ML IV EMUL
INTRAVENOUS | Status: AC
  Filled 2016-07-16: qty 50

## 2016-07-16 MED ORDER — LIDOCAINE HCL (PF) 2 % IJ SOLN
INTRAMUSCULAR | Status: AC
  Filled 2016-07-16: qty 2

## 2016-07-16 MED ORDER — PROPOFOL 500 MG/50ML IV EMUL
INTRAVENOUS | Status: DC | PRN
  Administered 2016-07-16: 150 ug/kg/min via INTRAVENOUS

## 2016-07-16 MED ORDER — LIDOCAINE HCL (CARDIAC) 20 MG/ML IV SOLN
INTRAVENOUS | Status: DC | PRN
  Administered 2016-07-16: 50 mg via INTRAVENOUS

## 2016-07-16 MED ORDER — PROPOFOL 10 MG/ML IV BOLUS
INTRAVENOUS | Status: DC | PRN
  Administered 2016-07-16: 50 mg via INTRAVENOUS

## 2016-07-16 MED ORDER — SODIUM CHLORIDE 0.9 % IV SOLN
INTRAVENOUS | Status: DC
  Administered 2016-07-16: 1000 mL via INTRAVENOUS

## 2016-07-16 NOTE — Op Note (Signed)
[facility] Gastroenterology Patient Name: Jodi [name] Procedure Date: 07/16/2016 8:59 AM MRN: [id number] Account #: [id number] Date of Birth: 1945/05/16 Admit Type: Outpatient Age: 71 Room: [facility] ENDO ROOM 1 Gender: Female Note Status: Finalized Procedure:            Colonoscopy Indications:          Screening for colorectal malignant neoplasm Providers:            Jonathon Bellows MD, MD Referring MD:         Deborra Medina, MD (Referring MD) Medicines:            Monitored Anesthesia Care Complications:        No immediate complications. Procedure:            Pre-Anesthesia Assessment:                       - Prior to the procedure, a History and Physical was                        performed, and patient medications, allergies and                        sensitivities were reviewed. The patient's tolerance of                        previous anesthesia was reviewed.                       - The risks and benefits of the procedure and the                        sedation options and risks were discussed with the                        patient. All questions were answered and informed                        consent was obtained.                       - ASA Grade Assessment: II - A patient with mild                        systemic disease.                       After obtaining informed consent, the colonoscope was                        passed under direct vision. Throughout the procedure,                        the patient's blood pressure, pulse, and oxygen                        saturations were monitored continuously. The                        Colonoscope was introduced through the anus and  advanced to the the cecum, identified by the                        appendiceal orifice, IC valve and transillumination.                        The colonoscopy was performed with ease. The patient                        tolerated the procedure well. The  quality of the bowel                        preparation was good. Findings:      The perianal and digital rectal examinations were normal.      A few small-mouthed diverticula were found in the sigmoid colon.      Four sessile polyps were found in the ascending colon and cecum. The       polyps were 3 to 6 mm in size. These polyps were removed with a cold       snare. Resection and retrieval were complete.      A 3 mm polyp was found in the rectum. The polyp was sessile. The polyp       was removed with a cold snare. Resection and retrieval were complete.      The exam was otherwise without abnormality on direct and retroflexion       views. Impression:           - Diverticulosis in the sigmoid colon.                       - Four 3 to 6 mm polyps in the ascending colon and in                        the cecum, removed with a cold snare. Resected and                        retrieved.                       - One 3 mm polyp in the rectum, removed with a cold                        snare. Resected and retrieved.                       - The examination was otherwise normal on direct and                        retroflexion views. Recommendation:       - Discharge patient to home (with escort).                       - Resume previous diet.                       - Continue present medications.                       - Await pathology results.                       -  Repeat colonoscopy in 3 - 5 years for surveillance                        based on pathology results. Procedure Code(s):    --- Professional ---                       [phone], Colonoscopy, flexible; with removal of tumor(s),                        polyp(s), or other lesion(s) by snare technique Diagnosis Code(s):    --- Professional ---                       Z12.11, Encounter for screening for malignant neoplasm                        of colon                       D12.2, Benign neoplasm of ascending colon                        D12.0, Benign neoplasm of cecum                       K62.1, Rectal polyp                       K57.30, Diverticulosis of large intestine without                        perforation or abscess without bleeding CPT copyright 2016 American Medical Association. All rights reserved. The codes documented in this report are preliminary and upon coder review may  be revised to meet current compliance requirements. Jonathon Bellows, MD Jonathon Bellows MD, MD 07/16/2016 9:33:33 AM This report has been signed electronically. Number of Addenda: 0 Note Initiated On: 07/16/2016 8:59 AM Scope Withdrawal Time: 0 hours 19 minutes 21 seconds  Total Procedure Duration: 0 hours 22 minutes 57 seconds       [facility]

## 2016-07-16 NOTE — Anesthesia Post-op Follow-up Note (Cosign Needed)
Anesthesia QCDR form completed.        

## 2016-07-16 NOTE — Transfer of Care (Signed)
Immediate Anesthesia Transfer of Care Note  Patient: Jodi [name]  Procedure(s) Performed: Procedure(s): COLONOSCOPY WITH PROPOFOL (N/A)  Patient Location: PACU  Anesthesia Type:General  Level of Consciousness: sedated and responds to stimulation  Airway & Oxygen Therapy: Patient Spontanous Breathing and Patient connected to nasal cannula oxygen  Post-op Assessment: Report given to RN and Post -op Vital signs reviewed and stable  Post vital signs: Reviewed and stable  Last Vitals:  Vitals:   07/16/16 0937 07/16/16 0938  BP: 112/76 112/76  Pulse:  84  Resp:  13  Temp:      Last Pain:  Vitals:   07/16/16 0936  TempSrc: Tympanic      Patients Stated Pain Goal: 0 (75/05/18 3358)  Complications: No apparent anesthesia complications

## 2016-07-16 NOTE — Anesthesia Postprocedure Evaluation (Signed)
Anesthesia Post Note  Patient: Jodi [name]  Procedure(s) Performed: Procedure(s) (LRB): COLONOSCOPY WITH PROPOFOL (N/A)  Patient location during evaluation: Endoscopy Anesthesia Type: General Level of consciousness: awake and alert and oriented Pain management: pain level controlled Vital Signs Assessment: post-procedure vital signs reviewed and stable Respiratory status: spontaneous breathing, nonlabored ventilation and respiratory function stable Cardiovascular status: blood pressure returned to baseline and stable Postop Assessment: no signs of nausea or vomiting Anesthetic complications: no     Last Vitals:  Vitals:   07/16/16 0938 07/16/16 0946  BP: 112/76 134/77  Pulse: 84 77  Resp: 13 17  Temp:      Last Pain:  Vitals:   07/16/16 0936  TempSrc: Tympanic                 Asiel Chrostowski

## 2016-07-16 NOTE — Anesthesia Preprocedure Evaluation (Signed)
Anesthesia Evaluation  Patient identified by MRN, date of birth, ID band Patient awake    Reviewed: Allergy & Precautions, NPO status , Patient's Chart, lab work & pertinent test results  History of Anesthesia Complications Negative for: history of anesthetic complications  Airway Mallampati: III  TM Distance: >3 FB Neck ROM: Full    Dental  (+) Poor Dentition, Missing   Pulmonary sleep apnea and Continuous Positive Airway Pressure Ventilation , neg COPD,    breath sounds clear to auscultation- rhonchi (-) wheezing      Cardiovascular Exercise Tolerance: Good hypertension, Pt. on medications (-) CAD and (-) Past MI  Rhythm:Regular Rate:Normal - Systolic murmurs and - Diastolic murmurs    Neuro/Psych Anxiety negative neurological ROS     GI/Hepatic negative GI ROS, Neg liver ROS,   Endo/Other  diabetes (diet controlled)  Renal/GU negative Renal ROS     Musculoskeletal  (+) Arthritis ,   Abdominal (+) - obese,   Peds  Hematology negative hematology ROS (+)   Anesthesia Other Findings Past Medical History: No date: Anxiety No date: Arthritis No date: Diabetes mellitus without complication (HCC)     Comment: diet controlled since gastric bypass No date: Hypertension No date: Sleep apnea   Reproductive/Obstetrics                             Anesthesia Physical Anesthesia Plan  ASA: II  Anesthesia Plan: General   Post-op Pain Management:    Induction: Intravenous  PONV Risk Score and Plan: 2 and Propofol  Airway Management Planned: Natural Airway  Additional Equipment:   Intra-op Plan:   Post-operative Plan:   Informed Consent: I have reviewed the patients History and Physical, chart, labs and discussed the procedure including the risks, benefits and alternatives for the proposed anesthesia with the patient or authorized representative who has indicated his/her understanding  and acceptance.   Dental advisory given  Plan Discussed with: CRNA and Anesthesiologist  Anesthesia Plan Comments:         Anesthesia Quick Evaluation

## 2016-07-16 NOTE — H&P (Signed)
Jonathon Bellows MD [address] [place], [place] [address] Phone: [phone] Fax : [phone]  Primary Care Physician:  [name], MD Primary Gastroenterologist:  Dr. Jonathon Bellows   Pre-Procedure History & Physical: HPI:  Jodi [name] is a 71 y.o. female is here for an colonoscopy.   Past Medical History:  Diagnosis Date  . Anxiety   . Arthritis   . Diabetes mellitus without complication ([place])    diet controlled since gastric bypass  . Hypertension   . Sleep apnea     Past Surgical History:  Procedure Laterality Date  . BLADDER SUSPENSION    . CHOLECYSTECTOMY    . JOINT REPLACEMENT     left hip, right knee  . ROUX-EN-Y PROCEDURE  2010  . tracheotomy     at age 84 due to respiratroy failure    Prior to Admission medications   Medication Sig Start Date End Date Taking? Authorizing Provider  amitriptyline (ELAVIL) 50 MG tablet Take 1 tablet (50 mg total) by mouth at bedtime. 05/09/16  Yes [name], MD  buPROPion (WELLBUTRIN SR) 100 MG 12 hr tablet TAKE 1 TABLET (100 MG TOTAL) BY MOUTH 2 (TWO) TIMES DAILY. 06/18/16  Yes [name], MD  clonazePAM (KLONOPIN) 0.5 MG tablet TAKE 1 TABLET BY MOUTH 2 TIMES A DAY AS NEEDED FOR ANXIETY 05/13/16  Yes [name], MD  hydrochlorothiazide (HYDRODIURIL) 25 MG tablet TAKE 1 TABLET (25 MG TOTAL) BY MOUTH DAILY. 01/18/16  Yes [name], MD  HYDROcodone-acetaminophen (NORCO) 10-325 MG tablet TAKE 1 TABLET BY MOUTH EVERY 8 HOURS AS NEEDED FOR PAIN, MAXIMUM 2 DAILY 05/09/16  Yes [name], MD  HYDROcodone-acetaminophen (NORCO) 10-325 MG tablet TAKE 1 TABLET BY MOUTH EVERY 8 HOURS AS NEEDED FOR PAIN, MAXIMUM 2 DAILY 05/09/16  Yes [name], MD  meloxicam (MOBIC) 15 MG tablet TAKE 1 TABLET (15 MG TOTAL) BY MOUTH DAILY AS NEEDED FOR JOINT PAIN 02/07/16  Yes [name], MD  omeprazole (PRILOSEC) 20 MG capsule TAKE 1 CAPSULE (20 MG TOTAL) BY MOUTH DAILY. 07/08/16  Yes [name], MD  sertraline (ZOLOFT)  100 MG tablet TAKE 1 TABLET (100 MG TOTAL) BY MOUTH DAILY. 05/13/16  Yes [name], MD  Tdap Durwin Reges) 5-2.5-18.5 LF-MCG/0.5 injection Inject 0.5 mLs into the muscle once. 02/10/15  Yes [name], MD  traMADol (ULTRAM) 50 MG tablet TAKE 2 TABLETS BY MOUTH EVERY 8 HOURS AS NEEDED FOR PAIN 06/07/16  Yes [name], MD  traZODone (DESYREL) 100 MG tablet TAKE 1 TABLET (100 MG TOTAL) BY MOUTH 2 (TWO) TIMES DAILY. 05/06/16  Yes [name], MD  amitriptyline (ELAVIL) 25 MG tablet  05/06/16   [provider]  amoxicillin (AMOXIL) 500 MG capsule Take 4 capsules by mouth daily. 02/01/15   [provider]    Allergies as of 06/14/2016 - Review Complete 05/23/2016  Allergen Reaction Noted  . Morphine and related Nausea And Vomiting 10/30/2011    Family History  Problem Relation Age of Onset  . COPD Mother   . Hypertension Mother   . Ovarian cancer Mother 50       ovarian CA  . Breast cancer Daughter   . Breast cancer Paternal Aunt   . Breast cancer Paternal Grandmother     Social History   Social History  . Marital status: Widowed    Spouse name: N/A  . Number of children: N/A  . Years of  education: N/A   Occupational History  . Not on file.   Social History Main Topics  . Smoking status: Never Smoker  . Smokeless tobacco: Never Used  . Alcohol use No  . Drug use: No  . Sexual activity: Not Currently   Other Topics Concern  . Not on file   Social History Narrative  . No narrative on file    Review of Systems: See HPI, otherwise negative ROS  Physical Exam: BP (!) 108/91   Pulse (!) 103   Temp (!) 96.4 F (35.8 C) (Tympanic)   Resp 18   Ht 5\' 5"  (1.651 m)   Wt 180 lb (81.6 kg)   SpO2 98%   BMI 29.95 kg/m  General:   Alert,  pleasant and cooperative in NAD Head:  Normocephalic and atraumatic. Neck:  Supple; no masses or thyromegaly. Lungs:  Clear throughout to auscultation.    Heart:  Regular rate and rhythm. Abdomen:  Soft,  nontender and nondistended. Normal bowel sounds, without guarding, and without rebound.   Neurologic:  Alert and  oriented x4;  grossly normal neurologically.  Impression/Plan: [name] is here for an colonoscopy to be performed for Screening colonoscopy average risk   Risks, benefits, limitations, and alternatives regarding  colonoscopy have been reviewed with the patient.  Questions have been answered.  All parties agreeable.   Jonathon Bellows, MD  07/16/2016, 8:48 AM

## 2016-07-16 NOTE — Anesthesia Procedure Notes (Signed)
Performed by: Jerolyn Flenniken Pre-anesthesia Checklist: Patient identified, Emergency Drugs available, Suction available, Patient being monitored and Timeout performed Patient Re-evaluated:Patient Re-evaluated prior to inductionOxygen Delivery Method: Nasal cannula Preoxygenation: Pre-oxygenation with 100% oxygen Intubation Type: IV induction       

## 2016-07-17 ENCOUNTER — Encounter: Payer: Self-pay | Admitting: Gastroenterology

## 2016-07-18 LAB — SURGICAL PATHOLOGY

## 2016-07-21 ENCOUNTER — Encounter: Payer: Self-pay | Admitting: Gastroenterology

## 2016-08-01 ENCOUNTER — Other Ambulatory Visit: Payer: Self-pay | Admitting: Internal Medicine

## 2016-08-05 ENCOUNTER — Telehealth: Payer: Self-pay

## 2016-08-05 ENCOUNTER — Other Ambulatory Visit: Payer: Self-pay

## 2016-08-05 MED ORDER — TRAZODONE HCL 100 MG PO TABS: 100.0000 mg | ORAL_TABLET | Freq: Two times a day (BID) | ORAL | 1 refills | Status: DC

## 2016-08-05 MED ORDER — BUPROPION HCL ER (SR) 100 MG PO TB12: ORAL_TABLET | ORAL | 0 refills | Status: DC

## 2016-08-05 NOTE — Telephone Encounter (Signed)
90 days supply sent.  Chart says se is taking it twice daily is that correct?

## 2016-08-05 NOTE — Addendum Note (Signed)
Addended by: [name] on: 08/05/2016 11:05 AM   Modules accepted: Orders

## 2016-08-05 NOTE — Telephone Encounter (Signed)
Rx request for 90 day supply for Trazodone 100 mg tablet Last OV 05/09/2016 Next OV 08/09/2016 Last refilled 08/01/2016

## 2016-08-09 ENCOUNTER — Encounter: Payer: Self-pay | Admitting: Internal Medicine

## 2016-08-09 ENCOUNTER — Ambulatory Visit (INDEPENDENT_AMBULATORY_CARE_PROVIDER_SITE_OTHER): Payer: Medicare Other

## 2016-08-09 ENCOUNTER — Ambulatory Visit: Payer: Medicare Other

## 2016-08-09 ENCOUNTER — Ambulatory Visit (INDEPENDENT_AMBULATORY_CARE_PROVIDER_SITE_OTHER): Payer: Medicare Other | Admitting: Internal Medicine

## 2016-08-09 VITALS — BP 140/80 | HR 83 | Temp 97.9°F | Ht 65.0 in | Wt 194.4 lb

## 2016-08-09 DIAGNOSIS — Z6834 Body mass index (BMI) 34.0-34.9, adult: Secondary | ICD-10-CM | POA: Diagnosis not present

## 2016-08-09 DIAGNOSIS — M25561 Pain in right knee: Secondary | ICD-10-CM

## 2016-08-09 DIAGNOSIS — Z96642 Presence of left artificial hip joint: Secondary | ICD-10-CM | POA: Diagnosis not present

## 2016-08-09 DIAGNOSIS — M25552 Pain in left hip: Secondary | ICD-10-CM

## 2016-08-09 DIAGNOSIS — F419 Anxiety disorder, unspecified: Secondary | ICD-10-CM | POA: Diagnosis not present

## 2016-08-09 DIAGNOSIS — M25562 Pain in left knee: Secondary | ICD-10-CM

## 2016-08-09 DIAGNOSIS — E119 Type 2 diabetes mellitus without complications: Secondary | ICD-10-CM

## 2016-08-09 DIAGNOSIS — G8929 Other chronic pain: Secondary | ICD-10-CM | POA: Diagnosis not present

## 2016-08-09 DIAGNOSIS — Z96651 Presence of right artificial knee joint: Secondary | ICD-10-CM | POA: Diagnosis not present

## 2016-08-09 DIAGNOSIS — E6609 Other obesity due to excess calories: Secondary | ICD-10-CM

## 2016-08-09 DIAGNOSIS — G4733 Obstructive sleep apnea (adult) (pediatric): Secondary | ICD-10-CM

## 2016-08-09 DIAGNOSIS — Z471 Aftercare following joint replacement surgery: Secondary | ICD-10-CM | POA: Diagnosis not present

## 2016-08-09 DIAGNOSIS — M170 Bilateral primary osteoarthritis of knee: Secondary | ICD-10-CM

## 2016-08-09 MED ORDER — HYDROCODONE-ACETAMINOPHEN 10-325 MG PO TABS: ORAL_TABLET | ORAL | 0 refills | Status: DC

## 2016-08-09 MED ORDER — CLONAZEPAM 0.5 MG PO TABS: 0.2500 mg | ORAL_TABLET | Freq: Two times a day (BID) | ORAL | 2 refills | Status: DC

## 2016-08-09 NOTE — Assessment & Plan Note (Addendum)
With history of TKR .  She reports continued pain not managed with tramadol and tylenol.  She has had plain films today that are normal,  With haesware in good alignment . She has not had any ER visits  And has not requested any early refills.  Her Refill history was confirmed via [place] Controlled Substance database by me today during her visit and there have been no prescriptions of controlled substances filled from any providers other than me. .  I have refilled her vicodin for 90 days and advised her that those will be my final prescriptions for chronic pain given the normal knee and hip x rays done today.  Will continue to refill  tramadol . The risks and benefits of narcotics use were reviewed with patient today including excessive sedation leading to respiratory depression,  impaired thinking/driving, and addiction.  Patient was advised to avoid concurrent use with alcohol, to use medication only as needed and not to share with others  .

## 2016-08-09 NOTE — Patient Instructions (Signed)
I cannot continue to prescribe clonazepam if you are not treating your sleep apnea, because your risk of NOT WAKING UP is increased.  I have reduced your dose to 0.25 mg with the next refill  If your knee  xrays are normal,  I am also going to  start weaning you off of vicodin  And will refer you to Orthopedic surgery  You have gained 10 lbs since your last visit .  This is going to make your joint pain worse

## 2016-08-09 NOTE — Progress Notes (Signed)
Subjective:  Patient ID: Jodi [name], female    DOB: 1945-08-25  Age: 71 y.o. MRN: [id number]  CC: The primary encounter diagnosis was Chronic pain of both knees. Diagnoses of OSA (obstructive sleep apnea), Osteoarthritis of both knees, unspecified osteoarthritis type, Left hip pain, Class 1 obesity due to excess calories without serious comorbidity with body mass index (BMI) of 34.0 to 34.9 in adult, Anxiety, and Diabetes mellitus without complication ([place]) were also pertinent to this visit.  HPI Jodi [name] presents for follow up on chronic pain including DJD right knee and left hip,  S/p joint replacement requiring use of vicodin,  GAD on zoloft and clonazepam and wellbutrin  Sent for sleep study after last visit .  OSA confirmed by spllit night study,  CPAP with 18 cm H20 recommended.  Has not received the CPAP bc she has not called the company back despite them leaving several messages. .   colonoscopy July ; tubular adenomas ,  F/u 3 to 5 years.   Has gained 10-14 lb since her last visit, blames it on "stress." and on her 27 yr old grandaughter who she "cannot say no to."  Not exercising.  Living alone finally.    Outpatient Medications Prior to Visit  Medication Sig Dispense Refill  . amitriptyline (ELAVIL) 25 MG tablet     . amitriptyline (ELAVIL) 50 MG tablet Take 1 tablet (50 mg total) by mouth at bedtime. 90 tablet 1  . amoxicillin (AMOXIL) 500 MG capsule Take 4 capsules by mouth daily.    Marland Kitchen buPROPion (WELLBUTRIN SR) 100 MG 12 hr tablet TAKE 1 TABLET (100 MG TOTAL) BY MOUTH 2 (TWO) TIMES DAILY. 180 tablet 0  . hydrochlorothiazide (HYDRODIURIL) 25 MG tablet TAKE 1 TABLET (25 MG TOTAL) BY MOUTH DAILY. 90 tablet 1  . omeprazole (PRILOSEC) 20 MG capsule TAKE 1 CAPSULE (20 MG TOTAL) BY MOUTH DAILY. 30 capsule 3  . sertraline (ZOLOFT) 100 MG tablet TAKE 1 TABLET (100 MG TOTAL) BY MOUTH DAILY. 90 tablet 1  . Tdap (BOOSTRIX) 5-2.5-18.5 LF-MCG/0.5 injection Inject 0.5 mLs into the  muscle once. 0.5 mL 0  . traMADol (ULTRAM) 50 MG tablet TAKE 2 TABLETS BY MOUTH EVERY 8 HOURS AS NEEDED FOR PAIN 180 tablet 1  . traZODone (DESYREL) 100 MG tablet Take 1 tablet (100 mg total) by mouth 2 (two) times daily. 180 tablet 1  . clonazePAM (KLONOPIN) 0.5 MG tablet TAKE 1 TABLET BY MOUTH 2 TIMES A DAY AS NEEDED FOR ANXIETY 60 tablet 2  . HYDROcodone-acetaminophen (NORCO) 10-325 MG tablet TAKE 1 TABLET BY MOUTH EVERY 8 HOURS AS NEEDED FOR PAIN, MAXIMUM 2 DAILY 60 tablet 0  . HYDROcodone-acetaminophen (NORCO) 10-325 MG tablet TAKE 1 TABLET BY MOUTH EVERY 8 HOURS AS NEEDED FOR PAIN, MAXIMUM 2 DAILY 60 tablet 0  . meloxicam (MOBIC) 15 MG tablet TAKE 1 TABLET (15 MG TOTAL) BY MOUTH DAILY AS NEEDED FOR JOINT PAIN 90 tablet 2   No facility-administered medications prior to visit.     Review of Systems;  Patient denies headache, fevers, malaise, unintentional weight loss, skin rash, eye pain, sinus congestion and sinus pain, sore throat, dysphagia,  hemoptysis , cough, dyspnea, wheezing, chest pain, palpitations, orthopnea, edema, abdominal pain, nausea, melena, diarrhea, constipation, flank pain, dysuria, hematuria, urinary  Frequency, nocturia, numbness, tingling, seizures,  Focal weakness, Loss of consciousness,  Tremor, insomnia, depression, anxiety, and suicidal ideation.      Objective:  BP 140/80   Pulse 83  Temp 97.9 F (36.6 C) (Oral)   Ht 5\' 5"  (1.651 m)   Wt 194 lb 6.4 oz (88.2 kg)   SpO2 95%   BMI 32.35 kg/m   BP Readings from Last 3 Encounters:  08/09/16 140/80  07/16/16 134/78  05/23/16 136/72    Wt Readings from Last 3 Encounters:  08/09/16 194 lb 6.4 oz (88.2 kg)  07/16/16 180 lb (81.6 kg)  05/23/16 184 lb (83.5 kg)    General appearance: alert, cooperative and appears stated age Ears: normal TM's and external ear canals both ears Throat: lips, mucosa, and tongue normal; teeth and gums normal Neck: no adenopathy, no carotid bruit, supple, symmetrical,  trachea midline and thyroid not enlarged, symmetric, no tenderness/mass/nodules Back: symmetric, no curvature. ROM normal. No CVA tenderness. Lungs: clear to auscultation bilaterally Heart: regular rate and rhythm, S1, S2 normal, no murmur, click, rub or gallop Abdomen: soft, non-tender; bowel sounds normal; no masses,  no organomegaly Pulses: 2+ and symmetric Skin: Skin color, texture, turgor normal. No rashes or lesions Lymph nodes: Cervical, supraclavicular, and axillary nodes normal.  Lab Results  Component Value Date   HGBA1C 5.5 05/09/2016   HGBA1C 5.7 02/07/2016   HGBA1C 5.7 07/26/2015    Lab Results  Component Value Date   CREATININE 1.16 05/09/2016   CREATININE 1.13 02/07/2016   CREATININE 1.12 07/26/2015    Lab Results  Component Value Date   WBC 5.5 02/10/2015   HGB 12.5 02/10/2015   HCT 38.0 02/10/2015   PLT 245.0 02/10/2015   GLUCOSE 92 05/09/2016   CHOL 206 (H) 05/09/2016   TRIG 126.0 05/09/2016   HDL 86.10 05/09/2016   LDLDIRECT 105.0 05/09/2016   LDLCALC 94 05/09/2016   ALT 13 05/09/2016   AST 15 05/09/2016   NA 140 05/09/2016   K 4.1 05/09/2016   CL 106 05/09/2016   CREATININE 1.16 05/09/2016   BUN 22 05/09/2016   CO2 30 05/09/2016   TSH 4.44 05/09/2016   HGBA1C 5.5 05/09/2016   MICROALBUR 1.1 05/09/2016    No results found.  Assessment & Plan:   Problem List Items Addressed This Visit    OSA (obstructive sleep apnea)    Confirmed with recent split night test.  Untreated due to patient noncompliance.  Encouraged to call the company back who is trying to get her the CPAP machine.       Obesity    She has gained 14 lbs since her last visit,  Because she is not exercising or following a careful diet. I have addressed  BMI and recommended a low glycemic index diet utilizing smaller more frequent meals to increase metabolism.  I have also recommended that patient start exercising with a goal of 30 minutes of aerobic exercise a minimum of 5 days  per week.        DJD (degenerative joint disease) of knee    With history of TKR .  She reports continued pain not managed with tramadol and tylenol.  She has had plain films today that are normal,  With haesware in good alignment . She has not had any ER visits  And has not requested any early refills.  Her Refill history was confirmed via [place] Controlled Substance database by me today during her visit and there have been no prescriptions of controlled substances filled from any providers other than me. .  I have refilled her vicodin for 90 days and advised her that those will be my final prescriptions for chronic pain given  the normal knee and hip x rays done today.  Will continue to refill  tramadol . The risks and benefits of narcotics use were reviewed with patient today including excessive sedation leading to respiratory depression,  impaired thinking/driving, and addiction.  Patient was advised to avoid concurrent use with alcohol, to use medication only as needed and not to share with others  .            Relevant Medications   HYDROcodone-acetaminophen (NORCO) 10-325 MG tablet   HYDROcodone-acetaminophen (NORCO) 10-325 MG tablet   Diabetes mellitus without complication (HCC)     Historically well-controlled on diet alone since her gastric bypass  In 2011.  hemoglobin A1c has been consistently at or  less than 6.0 .  Patient is NOT up-to-date on eye exams and foot exam is normal today. Patient has no microalbuminuria. Patient is tolerating statin therapy for CAD risk reduction and on ACE/ARB for renal protection and hypertension  Lab Results  Component Value Date   HGBA1C 5.5 05/09/2016   Lab Results  Component Value Date   MICROALBUR 1.1 05/09/2016             Anxiety    I am reducing her clonazepam dose to 0.25 mg once daily given her concurrent use of tramadol and vicodin. Continue zoloft 100 mg daily        Other Visit Diagnoses    Chronic pain of both knees    -   Primary   Relevant Medications   clonazePAM (KLONOPIN) 0.5 MG tablet   HYDROcodone-acetaminophen (NORCO) 10-325 MG tablet   HYDROcodone-acetaminophen (NORCO) 10-325 MG tablet   Other Relevant Orders   DG Knee Complete 4 Views Right (Completed)   Left hip pain       Relevant Orders   DG HIP UNILAT W OR W/O PELVIS 1V LEFT (Completed)      I have discontinued Ms. Avalos's meloxicam. I have also changed her clonazePAM. Additionally, I am having her maintain her amoxicillin, Tdap, hydrochlorothiazide, amitriptyline, sertraline, traMADol, amitriptyline, omeprazole, buPROPion, traZODone, HYDROcodone-acetaminophen, and HYDROcodone-acetaminophen.  Meds ordered this encounter  Medications  . clonazePAM (KLONOPIN) 0.5 MG tablet    Sig: Take 0.5 tablets (0.25 mg total) by mouth 2 (two) times daily.    Dispense:  60 tablet    Refill:  2    Not to exceed 3 additional fills before 08/05/2016.  Marland Kitchen DISCONTD: HYDROcodone-acetaminophen (NORCO) 10-325 MG tablet    Sig: TAKE 1 TABLET BY MOUTH EVERY 8 HOURS AS NEEDED FOR PAIN, MAXIMUM 2 DAILY    Dispense:  60 tablet    Refill:  0    MAY REFILL ON OR AFTER August 12 2016  . HYDROcodone-acetaminophen (NORCO) 10-325 MG tablet    Sig: TAKE 1 TABLET BY MOUTH EVERY 8 HOURS AS NEEDED FOR PAIN, MAXIMUM 2 DAILY    Dispense:  60 tablet    Refill:  0    MAY REFILL ON OR AFTER September 12 2016  . HYDROcodone-acetaminophen (NORCO) 10-325 MG tablet    Sig: TAKE 1 TABLET BY MOUTH EVERY 8 HOURS AS NEEDED FOR PAIN, MAXIMUM 2 DAILY    Dispense:  60 tablet    Refill:  0    May refill on or after October 12 2016    Medications Discontinued During This Encounter  Medication Reason  . clonazePAM (KLONOPIN) 0.5 MG tablet Reorder  . HYDROcodone-acetaminophen (NORCO) 10-325 MG tablet Reorder  . HYDROcodone-acetaminophen (NORCO) 10-325 MG tablet Reorder  . HYDROcodone-acetaminophen (NORCO) 10-325  MG tablet Reorder  . meloxicam (MOBIC) 15 MG tablet     Follow-up:  Return in about 3 months (around 11/09/2016).   [name], MD

## 2016-08-09 NOTE — Progress Notes (Signed)
Pre visit review using our clinic review tool, if applicable. No additional management support is needed unless otherwise documented below in the visit note. 

## 2016-08-09 NOTE — Assessment & Plan Note (Addendum)
Confirmed with recent split night test.  Untreated due to patient noncompliance.  Encouraged to call the company back who is trying to get her the CPAP machine.

## 2016-08-11 NOTE — Assessment & Plan Note (Signed)
She has gained 14 lbs since her last visit,  Because she is not exercising or following a careful diet. I have addressed  BMI and recommended a low glycemic index diet utilizing smaller more frequent meals to increase metabolism.  I have also recommended that patient start exercising with a goal of 30 minutes of aerobic exercise a minimum of 5 days per week.

## 2016-08-11 NOTE — Assessment & Plan Note (Signed)
I am reducing her clonazepam dose to 0.25 mg once daily given her concurrent use of tramadol and vicodin. Continue zoloft 100 mg daily

## 2016-08-11 NOTE — Assessment & Plan Note (Signed)
Historically well-controlled on diet alone since her gastric bypass  In 2011.  hemoglobin A1c has been consistently at or  less than 6.0 .  Patient is NOT up-to-date on eye exams and foot exam is normal today. Patient has no microalbuminuria. Patient is tolerating statin therapy for CAD risk reduction and on ACE/ARB for renal protection and hypertension  Lab Results  Component Value Date   HGBA1C 5.5 05/09/2016   Lab Results  Component Value Date   MICROALBUR 1.1 05/09/2016

## 2016-08-14 ENCOUNTER — Other Ambulatory Visit: Payer: Self-pay | Admitting: Internal Medicine

## 2016-08-14 DIAGNOSIS — M25561 Pain in right knee: Principal | ICD-10-CM

## 2016-08-14 DIAGNOSIS — G8929 Other chronic pain: Secondary | ICD-10-CM

## 2016-08-20 DIAGNOSIS — Z96651 Presence of right artificial knee joint: Secondary | ICD-10-CM | POA: Diagnosis not present

## 2016-08-20 DIAGNOSIS — Z96642 Presence of left artificial hip joint: Secondary | ICD-10-CM | POA: Diagnosis not present

## 2016-08-20 DIAGNOSIS — M1711 Unilateral primary osteoarthritis, right knee: Secondary | ICD-10-CM | POA: Diagnosis not present

## 2016-09-12 ENCOUNTER — Telehealth: Payer: Self-pay | Admitting: *Deleted

## 2016-09-12 NOTE — Telephone Encounter (Signed)
Last fill 06/07/16 for 180 with 1 refill ok to fill?

## 2016-09-12 NOTE — Telephone Encounter (Signed)
Pt requested a medication refill for tramadol  Pharmacy [facility] in [place]

## 2016-09-13 MED ORDER — TRAMADOL HCL 50 MG PO TABS: 100.0000 mg | ORAL_TABLET | Freq: Three times a day (TID) | ORAL | 1 refills | Status: DC | PRN

## 2016-09-13 NOTE — Telephone Encounter (Signed)
Script faxed as requested.

## 2016-09-13 NOTE — Telephone Encounter (Signed)
Refill authorized.

## 2016-10-07 ENCOUNTER — Other Ambulatory Visit: Payer: Self-pay | Admitting: Internal Medicine

## 2016-10-16 ENCOUNTER — Other Ambulatory Visit: Payer: Self-pay | Admitting: Internal Medicine

## 2016-10-29 ENCOUNTER — Other Ambulatory Visit: Payer: Self-pay | Admitting: Internal Medicine

## 2016-11-07 ENCOUNTER — Other Ambulatory Visit: Payer: Self-pay | Admitting: Internal Medicine

## 2016-11-11 ENCOUNTER — Encounter: Payer: Self-pay | Admitting: Internal Medicine

## 2016-11-11 ENCOUNTER — Ambulatory Visit (INDEPENDENT_AMBULATORY_CARE_PROVIDER_SITE_OTHER): Payer: Medicare Other | Admitting: Internal Medicine

## 2016-11-11 VITALS — BP 128/76 | HR 82 | Temp 98.0°F | Resp 14 | Ht 65.0 in | Wt 193.0 lb

## 2016-11-11 DIAGNOSIS — M170 Bilateral primary osteoarthritis of knee: Secondary | ICD-10-CM | POA: Diagnosis not present

## 2016-11-11 DIAGNOSIS — E6609 Other obesity due to excess calories: Secondary | ICD-10-CM

## 2016-11-11 DIAGNOSIS — E118 Type 2 diabetes mellitus with unspecified complications: Secondary | ICD-10-CM | POA: Diagnosis not present

## 2016-11-11 DIAGNOSIS — I499 Cardiac arrhythmia, unspecified: Secondary | ICD-10-CM

## 2016-11-11 DIAGNOSIS — Z6834 Body mass index (BMI) 34.0-34.9, adult: Secondary | ICD-10-CM | POA: Diagnosis not present

## 2016-11-11 DIAGNOSIS — Z23 Encounter for immunization: Secondary | ICD-10-CM

## 2016-11-11 LAB — COMPREHENSIVE METABOLIC PANEL
ALBUMIN: 3.9 g/dL (ref 3.5–5.2)
ALT: 14 U/L (ref 0–35)
AST: 16 U/L (ref 0–37)
Alkaline Phosphatase: 62 U/L (ref 39–117)
BUN: 26 mg/dL — ABNORMAL HIGH (ref 6–23)
CALCIUM: 9.1 mg/dL (ref 8.4–10.5)
CHLORIDE: 104 meq/L (ref 96–112)
CO2: 29 mEq/L (ref 19–32)
CREATININE: 1.25 mg/dL — AB (ref 0.40–1.20)
GFR: 45.03 mL/min — AB (ref >60.00)
Glucose, Bld: 97 mg/dL (ref 70–99)
POTASSIUM: 4.2 meq/L (ref 3.5–5.1)
Sodium: 139 mEq/L (ref 135–145)
Total Bilirubin: 0.5 mg/dL (ref 0.2–1.2)
Total Protein: 6 g/dL (ref 6.0–8.3)

## 2016-11-11 LAB — MICROALBUMIN / CREATININE URINE RATIO
Creatinine,U: 78.3 mg/dL
MICROALB UR: 0.9 mg/dL (ref 0.0–1.9)
MICROALB/CREAT RATIO: 1.2 mg/g (ref 0.0–30.0)

## 2016-11-11 LAB — HEMOGLOBIN A1C: HEMOGLOBIN A1C: 5.6 % (ref 4.6–6.5)

## 2016-11-11 LAB — MAGNESIUM: MAGNESIUM: 2.1 mg/dL (ref 1.5–2.5)

## 2016-11-11 MED ORDER — HYDROCODONE-ACETAMINOPHEN 10-325 MG PO TABS: ORAL_TABLET | ORAL | 0 refills | Status: DC

## 2016-11-11 MED ORDER — HYDROCHLOROTHIAZIDE 25 MG PO TABS: ORAL_TABLET | ORAL | 1 refills | Status: DC

## 2016-11-11 MED ORDER — HYDROCODONE-ACETAMINOPHEN 10-325 MG PO TABS
ORAL_TABLET | ORAL | 0 refills | Status: DC
Start: 2016-11-11 — End: 2016-11-11

## 2016-11-11 MED ORDER — OMEPRAZOLE 20 MG PO CPDR: DELAYED_RELEASE_CAPSULE | ORAL | 0 refills | Status: DC

## 2016-11-11 MED ORDER — AMOXICILLIN 500 MG PO CAPS: 2000.0000 mg | ORAL_CAPSULE | Freq: Once | ORAL | 2 refills | Status: AC

## 2016-11-11 NOTE — Patient Instructions (Addendum)
Please Stop stress eating!  You have gained 30 lb s since your bariatric surgery  And your are now obese again!  Please start going to PT and Silver Sneakers.  Your knee is hurting because you have let your muscles get weak.  If you have not lost 12 lbs and attended at last one PT session  by your next visit I will start reducing your hydrocodone becayse you are not  doing your part  To relieve the stress on your knees    Muffins are NOT  A good breakfast  Premier Protein Shakes Eggs/bacon or ham without potatoes,  Ok to have one piece of whole wheat toast Danton Clap now makes a frozen breakfast frittata and A FRITATTA SANDWHICH  that can be microwaved in 2 minutes .  These are very low carb. Frittats are similar to quiches without the crust

## 2016-11-11 NOTE — Progress Notes (Signed)
Subjective:  Patient ID: Jodi [name], female    DOB: Aug 29, 1945  Age: 71 y.o. MRN: [id number]  CC: The primary encounter diagnosis was Controlled type 2 diabetes mellitus with complication, without long-term current use of insulin ([place]). Diagnoses of Cardiac arrhythmia, unspecified cardiac arrhythmia type, Encounter for immunization, Class 1 obesity due to excess calories without serious comorbidity with body mass index (BMI) of 34.0 to 34.9 in adult, and Osteoarthritis of both knees, unspecified osteoarthritis type were also pertinent to this visit.  HPI Jodi [name] presents for medication refill and follow up on chronic knee pain managed with vicodin   Had an orthopedic  evaluation  For bilateral knees and left hip  No surgical issues,  Recommended PT for right knee . Has not had PT ].  Not exercising .  Thinking of joining [facility] MONTH [facility]LLOW [facility].  LAST MEAL 7 AM  Chocolate chip muffin   NOV DEC JAN opiate rx's REFILLED.   Weight gain of 30 lbs since reaching her nadir in 2013 after bariatric surgery . blames  her sister's death at age 68 after a fall.     Outpatient Medications Prior to Visit  Medication Sig Dispense Refill  . amitriptyline (ELAVIL) 50 MG tablet TAKE 1 TABLET (50 MG TOTAL) BY MOUTH AT BEDTIME. 90 tablet 1  . buPROPion (WELLBUTRIN SR) 100 MG 12 hr tablet TAKE 1 TABLET BY MOUTH TWICE A DAY 180 tablet 0  . clonazePAM (KLONOPIN) 0.5 MG tablet Take 0.5 tablets (0.25 mg total) by mouth 2 (two) times daily. 60 tablet 2  . meloxicam (MOBIC) 15 MG tablet TAKE 1 TABLET BY MOUTH DAILY AS NEEDED FOR JOINT PAIN 90 tablet 2  . sertraline (ZOLOFT) 100 MG tablet TAKE 1 TABLET BY MOUTH EVERY DAY 90 tablet 1  . traMADol (ULTRAM) 50 MG tablet Take 2 tablets (100 mg total) by mouth every 8 (eight) hours as needed. for pain 180 tablet 1  . traZODone (DESYREL) 100 MG tablet Take 1 tablet (100 mg total) by mouth 2 (two) times daily. 180 tablet 1  .  amoxicillin (AMOXIL) 500 MG capsule Take 4 capsules by mouth daily.    . hydrochlorothiazide (HYDRODIURIL) 25 MG tablet TAKE 1 TABLET (25 MG TOTAL) BY MOUTH DAILY. 90 tablet 1  . HYDROcodone-acetaminophen (NORCO) 10-325 MG tablet TAKE 1 TABLET BY MOUTH EVERY 8 HOURS AS NEEDED FOR PAIN, MAXIMUM 2 DAILY 60 tablet 0  . HYDROcodone-acetaminophen (NORCO) 10-325 MG tablet TAKE 1 TABLET BY MOUTH EVERY 8 HOURS AS NEEDED FOR PAIN, MAXIMUM 2 DAILY 60 tablet 0  . omeprazole (PRILOSEC) 20 MG capsule TAKE 1 CAPSULE BY MOUTH EVERY DAY 30 capsule 0  . amitriptyline (ELAVIL) 25 MG tablet     . Tdap (BOOSTRIX) 5-2.5-18.5 LF-MCG/0.5 injection Inject 0.5 mLs into the muscle once. 0.5 mL 0  . traZODone (DESYREL) 100 MG tablet TAKE 1 TABLET BY MOUTH TWICE A DAY (Patient not taking: Reported on 11/11/2016) 60 tablet 2   No facility-administered medications prior to visit.     Review of Systems;  Patient denies headache, fevers, malaise, unintentional weight loss, skin rash, eye pain, sinus congestion and sinus pain, sore throat, dysphagia,  hemoptysis , cough, dyspnea, wheezing, chest pain, palpitations, orthopnea, edema, abdominal pain, nausea, melena, diarrhea, constipation, flank pain, dysuria, hematuria, urinary  Frequency, nocturia, numbness, tingling, seizures,  Focal weakness, Loss of consciousness,  Tremor, insomnia, depression, anxiety, and suicidal ideation.      Objective:  BP 128/76 (BP Location: Left Arm, Patient Position: Sitting, Cuff Size: Normal)   Pulse 82   Temp 98 F (36.7 C) (Oral)   Resp 14   Ht 5\' 5"  (1.651 m)   Wt 193 lb (87.5 kg)   SpO2 97%   BMI 32.12 kg/m   BP Readings from Last 3 Encounters:  11/11/16 128/76  08/09/16 140/80  07/16/16 134/78    Wt Readings from Last 3 Encounters:  11/11/16 193 lb (87.5 kg)  08/09/16 194 lb 6.4 oz (88.2 kg)  07/16/16 180 lb (81.6 kg)    General appearance: alert, cooperative and appears stated age Ears: normal TM's and external ear  canals both ears Throat: lips, mucosa, and tongue normal; teeth and gums normal Neck: no adenopathy, no carotid bruit, supple, symmetrical, trachea midline and thyroid not enlarged, symmetric, no tenderness/mass/nodules Back: symmetric, no curvature. ROM normal. No CVA tenderness. Lungs: clear to auscultation bilaterally Heart: regular rate and rhythm, S1, S2 normal, no murmur, click, rub or gallop Abdomen: soft, non-tender; bowel sounds normal; no masses,  no organomegaly Pulses: 2+ and symmetric Skin: Skin color, texture, turgor normal. No rashes or lesions Lymph nodes: Cervical, supraclavicular, and axillary nodes normal.  Lab Results  Component Value Date   HGBA1C 5.6 11/11/2016   HGBA1C 5.5 05/09/2016   HGBA1C 5.7 02/07/2016    Lab Results  Component Value Date   CREATININE 1.25 (H) 11/11/2016   CREATININE 1.16 05/09/2016   CREATININE 1.13 02/07/2016    Lab Results  Component Value Date   WBC 5.5 02/10/2015   HGB 12.5 02/10/2015   HCT 38.0 02/10/2015   PLT 245.0 02/10/2015   GLUCOSE 97 11/11/2016   CHOL 206 (H) 05/09/2016   TRIG 126.0 05/09/2016   HDL 86.10 05/09/2016   LDLDIRECT 105.0 05/09/2016   LDLCALC 94 05/09/2016   ALT 14 11/11/2016   AST 16 11/11/2016   NA 139 11/11/2016   K 4.2 11/11/2016   CL 104 11/11/2016   CREATININE 1.25 (H) 11/11/2016   BUN 26 (H) 11/11/2016   CO2 29 11/11/2016   TSH 4.44 05/09/2016   HGBA1C 5.6 11/11/2016   MICROALBUR 0.9 11/11/2016    No results found.  Assessment & Plan:   Problem List Items Addressed This Visit    Arrhythmia   Relevant Medications   hydrochlorothiazide (HYDRODIURIL) 25 MG tablet   Other Relevant Orders   EKG 12-Lead (Completed)   Magnesium (Completed)   DJD (degenerative joint disease) of knee    She reports chronic pain despite Orthopedic evaluation finding no problem with  knee or hip  .  Her weight gain is likely aggravating her condition.  Strongly recommended weight loss with exercise and  diet.       Relevant Medications   HYDROcodone-acetaminophen (NORCO) 10-325 MG tablet   HYDROcodone-acetaminophen (NORCO) 10-325 MG tablet   Obesity    Continued weight gain addressed despite bariatric surgery.  Patient advised to start walking daily and get back on her low carb diet.        Other Visit Diagnoses    Controlled type 2 diabetes mellitus with complication, without long-term current use of insulin ([place])    -  Primary   Relevant Orders   Comprehensive metabolic panel (Completed)   Hemoglobin A1c (Completed)   Microalbumin / creatinine urine ratio (Completed)   Encounter for immunization       Relevant Orders   Flu vaccine HIGH DOSE PF (Completed)     A total of 25 minutes  of face to face time was spent with patient more than half of which was spent in counselling about the above mentioned conditions  and coordination of care  I have discontinued Annett B. Ryans's Tdap. I am also having her maintain her traZODone, clonazePAM, traMADol, buPROPion, amitriptyline, meloxicam, sertraline, hydrochlorothiazide, omeprazole, HYDROcodone-acetaminophen, and HYDROcodone-acetaminophen.  Meds ordered this encounter  Medications  . hydrochlorothiazide (HYDRODIURIL) 25 MG tablet    Sig: TAKE 1 TABLET (25 MG TOTAL) BY MOUTH DAILY.    Dispense:  90 tablet    Refill:  1  . omeprazole (PRILOSEC) 20 MG capsule    Sig: TAKE 1 CAPSULE BY MOUTH EVERY DAY    Dispense:  90 capsule    Refill:  0  . DISCONTD: HYDROcodone-acetaminophen (NORCO) 10-325 MG tablet    Sig: TAKE 1 TABLET BY MOUTH EVERY 8 HOURS AS NEEDED FOR PAIN, MAXIMUM 2 DAILY    Dispense:  60 tablet    Refill:  0    MAY REFILL ON OR AFTER January 12 2017  . HYDROcodone-acetaminophen (NORCO) 10-325 MG tablet    Sig: TAKE 1 TABLET BY MOUTH EVERY 8 HOURS AS NEEDED FOR PAIN, MAXIMUM 2 DAILY    Dispense:  60 tablet    Refill:  0    May refill on or after November 12 2016  . HYDROcodone-acetaminophen (NORCO) 10-325 MG tablet     Sig: TAKE 1 TABLET BY MOUTH EVERY 8 HOURS AS NEEDED FOR PAIN, MAXIMUM 2 DAILY    Dispense:  60 tablet    Refill:  0    MAY REFILL ON OR AFTER December 12 2016  . amoxicillin (AMOXIL) 500 MG capsule    Sig: Take 4 capsules (2,000 mg total) once for 1 dose by mouth. 1 hour prior to procedure    Dispense:  4 capsule    Refill:  2    Medications Discontinued During This Encounter  Medication Reason  . amitriptyline (ELAVIL) 25 MG tablet Duplicate  . Tdap (BOOSTRIX) 5-2.5-18.5 LF-MCG/0.5 injection Patient has not taken in last 30 days  . traZODone (DESYREL) 854 MG tablet Duplicate  . hydrochlorothiazide (HYDRODIURIL) 25 MG tablet Reorder  . omeprazole (PRILOSEC) 20 MG capsule Reorder  . HYDROcodone-acetaminophen (NORCO) 10-325 MG tablet Reorder  . HYDROcodone-acetaminophen (NORCO) 10-325 MG tablet Reorder  . HYDROcodone-acetaminophen (NORCO) 10-325 MG tablet Reorder  . amoxicillin (AMOXIL) 500 MG capsule Reorder    Follow-up: Return in about 3 months (around 02/11/2017) for weight gain, chronic pain .   [name], MD

## 2016-11-12 NOTE — Assessment & Plan Note (Signed)
She reports chronic pain despite Orthopedic evaluation finding no problem with  knee or hip  .  Her weight gain is likely aggravating her condition.  Strongly recommended weight loss with exercise and diet.

## 2016-11-12 NOTE — Assessment & Plan Note (Signed)
Continued weight gain addressed despite bariatric surgery.  Patient advised to start walking daily and get back on her low carb diet.

## 2016-12-12 ENCOUNTER — Other Ambulatory Visit: Payer: Self-pay | Admitting: Internal Medicine

## 2016-12-13 NOTE — Telephone Encounter (Signed)
Refilled: 09/13/2016 Last OV: 11/11/2016 Next OV: 02/12/2017

## 2016-12-13 NOTE — Telephone Encounter (Signed)
Ok'd refill tramadol #180 with no refills.  Pt has been on this medication regularly.  Refilled in Dr Lupita Dawn absence.

## 2016-12-13 NOTE — Telephone Encounter (Signed)
Printed, signed and faxed.  

## 2017-02-01 ENCOUNTER — Other Ambulatory Visit: Payer: Self-pay | Admitting: Internal Medicine

## 2017-02-03 NOTE — Telephone Encounter (Signed)
Refilled: 08/09/2016 Last OV: 11/11/2016 Next OV: 02/12/2017

## 2017-02-03 NOTE — Telephone Encounter (Signed)
refilled 

## 2017-02-04 NOTE — Telephone Encounter (Signed)
Klonopin Rx faxed to [facility] [place]

## 2017-02-06 ENCOUNTER — Other Ambulatory Visit: Payer: Self-pay | Admitting: Internal Medicine

## 2017-02-09 ENCOUNTER — Other Ambulatory Visit: Payer: Self-pay | Admitting: Internal Medicine

## 2017-02-10 NOTE — Telephone Encounter (Signed)
Refilled: 12/13/2016 Last OV: 11/11/2016 Next OV: 02/12/2017

## 2017-02-11 NOTE — Telephone Encounter (Signed)
Printed, signed and faxed.  

## 2017-02-12 ENCOUNTER — Encounter: Payer: Self-pay | Admitting: Internal Medicine

## 2017-02-12 ENCOUNTER — Ambulatory Visit (INDEPENDENT_AMBULATORY_CARE_PROVIDER_SITE_OTHER): Payer: Medicare HMO | Admitting: Internal Medicine

## 2017-02-12 DIAGNOSIS — Z6834 Body mass index (BMI) 34.0-34.9, adult: Secondary | ICD-10-CM

## 2017-02-12 DIAGNOSIS — E6609 Other obesity due to excess calories: Secondary | ICD-10-CM

## 2017-02-12 DIAGNOSIS — Z8639 Personal history of other endocrine, nutritional and metabolic disease: Secondary | ICD-10-CM

## 2017-02-12 DIAGNOSIS — G4733 Obstructive sleep apnea (adult) (pediatric): Secondary | ICD-10-CM

## 2017-02-12 DIAGNOSIS — M170 Bilateral primary osteoarthritis of knee: Secondary | ICD-10-CM

## 2017-02-12 MED ORDER — TRAMADOL HCL 50 MG PO TABS: 100.0000 mg | ORAL_TABLET | Freq: Three times a day (TID) | ORAL | 2 refills | Status: DC | PRN

## 2017-02-12 MED ORDER — HYDROCODONE-ACETAMINOPHEN 10-325 MG PO TABS: ORAL_TABLET | ORAL | 0 refills | Status: DC

## 2017-02-12 NOTE — Patient Instructions (Signed)
Only you can make the time to exercise  You have gained 35 lbs since reaching your goal after surgery  We will initiate the Prior authorization for tramadol and vicodin  If this doesn't work ,  You can either pay out of pocket , or see an orthopedist to consider knee replacemenent  RTC 3 months

## 2017-02-12 NOTE — Progress Notes (Signed)
Subjective:  Patient ID: Jodi [name], female    DOB: 1945/04/15  Age: 72 y.o. MRN: [id number]  CC: Diagnoses of Personal history of diabetes mellitus, OSA (obstructive sleep apnea), Class 1 obesity due to excess calories without serious comorbidity with body mass index (BMI) of 34.0 to 34.9 in adult, and Osteoarthritis of both knees, unspecified osteoarthritis type were pertinent to this visit.  HPI Jodi [name] presents for FOLLOW  UP ON TYPE 2 DIABETES,  GAD,  OA requiring daily ue of opiates due to chronic pain and bariatric surgery status.  1) chronic pain : Today she states that HER INSURANCE ([employer])  IS NO LONGER GOING TO COVER HER prescriptions for TRAMADOL AND VICODIN .  HAD TO PAY OUT OF POCKET $45 FOR A 30 DAY SUPPLY OF VICODIN.  2) obesity;  She continues to gain weight  (another 6 lbs since her last visit).  She is not exercising.  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  Her son sold the exercise equipment that he originally bought for her. She does not walk regularly.     Lab Results  Component Value Date   HGBA1C 5.6 11/11/2016         Outpatient Medications Prior to Visit  Medication Sig Dispense Refill  . amitriptyline (ELAVIL) 50 MG tablet TAKE 1 TABLET (50 MG TOTAL) BY MOUTH AT BEDTIME. 90 tablet 1  . buPROPion (WELLBUTRIN SR) 100 MG 12 hr tablet TAKE 1 TABLET BY MOUTH TWICE A DAY 180 tablet 0  . clonazePAM (KLONOPIN) 0.5 MG tablet TAKE 1/2 TABLET BY MOUTH TWICE A DAY 60 tablet 2  . hydrochlorothiazide (HYDRODIURIL) 25 MG tablet TAKE 1 TABLET (25 MG TOTAL) BY MOUTH DAILY. 90 tablet 1  . meloxicam (MOBIC) 15 MG tablet TAKE 1 TABLET BY MOUTH DAILY AS NEEDED FOR JOINT PAIN 90 tablet 2  . omeprazole (PRILOSEC) 20 MG capsule TAKE 1 CAPSULE BY MOUTH EVERY DAY 90 capsule 1  . sertraline (ZOLOFT) 100 MG tablet TAKE 1 TABLET BY MOUTH EVERY DAY 90 tablet 1  . traZODone (DESYREL) 100 MG tablet Take 1 tablet (100 mg total) by mouth 2 (two) times daily. 180  tablet 1  . HYDROcodone-acetaminophen (NORCO) 10-325 MG tablet TAKE 1 TABLET BY MOUTH EVERY 8 HOURS AS NEEDED FOR PAIN, MAXIMUM 2 DAILY 60 tablet 0  . HYDROcodone-acetaminophen (NORCO) 10-325 MG tablet TAKE 1 TABLET BY MOUTH EVERY 8 HOURS AS NEEDED FOR PAIN, MAXIMUM 2 DAILY 60 tablet 0  . traMADol (ULTRAM) 50 MG tablet TAKE 2 TABLETS BY MOUTH EVERY 8 HOURS AS NEEDED FOR PAIN 180 tablet 1   No facility-administered medications prior to visit.     Review of Systems;  Patient denies headache, fevers, malaise, unintentional weight loss, skin rash, eye pain, sinus congestion and sinus pain, sore throat, dysphagia,  hemoptysis , cough, dyspnea, wheezing, chest pain, palpitations, orthopnea, edema, abdominal pain, nausea, melena, diarrhea, constipation, flank pain, dysuria, hematuria, urinary  Frequency, nocturia, numbness, tingling, seizures,  Focal weakness, Loss of consciousness,  Tremor, insomnia, depression, anxiety, and suicidal ideation.      Objective:  BP 114/62 (BP Location: Left Arm, Patient Position: Sitting, Cuff Size: Large)   Pulse 83   Temp 98 F (36.7 C) (Oral)   Resp 15   Ht 5\' 5"  (1.651 m)   Wt 199 lb (90.3 kg)   SpO2 96%   BMI 33.12 kg/m   BP Readings from Last 3 Encounters:  02/12/17 114/62  11/11/16 128/76  08/09/16 140/80    Wt Readings from Last 3 Encounters:  02/12/17 199 lb (90.3 kg)  11/11/16 193 lb (87.5 kg)  08/09/16 194 lb 6.4 oz (88.2 kg)    General appearance: alert, cooperative and appears stated age Ears: normal TM's and external ear canals both ears Throat: lips, mucosa, and tongue normal; teeth and gums normal Neck: no adenopathy, no carotid bruit, supple, symmetrical, trachea midline and thyroid not enlarged, symmetric, no tenderness/mass/nodules Back: symmetric, no curvature. ROM normal. No CVA tenderness. Lungs: clear to auscultation bilaterally Heart: regular rate and rhythm, S1, S2 normal, no murmur, click, rub or gallop Abdomen: soft,  non-tender; bowel sounds normal; no masses,  no organomegaly Pulses: 2+ and symmetric Skin: Skin color, texture, turgor normal. No rashes or lesions Lymph nodes: Cervical, supraclavicular, and axillary nodes normal.  Lab Results  Component Value Date   HGBA1C 5.6 11/11/2016   HGBA1C 5.5 05/09/2016   HGBA1C 5.7 02/07/2016    Lab Results  Component Value Date   CREATININE 1.25 (H) 11/11/2016   CREATININE 1.16 05/09/2016   CREATININE 1.13 02/07/2016    Lab Results  Component Value Date   WBC 5.5 02/10/2015   HGB 12.5 02/10/2015   HCT 38.0 02/10/2015   PLT 245.0 02/10/2015   GLUCOSE 97 11/11/2016   CHOL 206 (H) 05/09/2016   TRIG 126.0 05/09/2016   HDL 86.10 05/09/2016   LDLDIRECT 105.0 05/09/2016   LDLCALC 94 05/09/2016   ALT 14 11/11/2016   AST 16 11/11/2016   NA 139 11/11/2016   K 4.2 11/11/2016   CL 104 11/11/2016   CREATININE 1.25 (H) 11/11/2016   BUN 26 (H) 11/11/2016   CO2 29 11/11/2016   TSH 4.44 05/09/2016   HGBA1C 5.6 11/11/2016   MICROALBUR 0.9 11/11/2016    No results found.  Assessment & Plan:   Problem List Items Addressed This Visit    Personal history of diabetes mellitus     Historically well-controlled on diet alone since her gastric bypass  In 2011.  hemoglobin A1c has been consistently at or  less than 6.0 .  Patient is NOT up-to-date on eye exams and foot exam is normal today. Patient has no microalbuminuria. Patient is tolerating statin therapy for CAD risk reduction and on ACE/ARB for renal protection and hypertension  Lab Results  Component Value Date   HGBA1C 5.6 11/11/2016   Lab Results  Component Value Date   MICROALBUR 0.9 11/11/2016             OSA (obstructive sleep apnea)    Confirmed with 2018 split night test.  She continues to defer treatment despite being advised of the risks of untreated OSA       Obesity    Continued weight gain addressed despite bariatric surgery.  Patient advised to start walking daily and get  back on her low carb diet.       DJD (degenerative joint disease) of knee    She reports chronic pain despite Orthopedic evaluation finding no problem with  knee or hip  .  Her weight gain is likely aggravating her condition.  Strongly recommended weight loss with exercise and diet. .  Tramadol and vicodin refilled for 3 months       Relevant Medications   HYDROcodone-acetaminophen (NORCO) 10-325 MG tablet   HYDROcodone-acetaminophen (NORCO) 10-325 MG tablet   traMADol (ULTRAM) 50 MG tablet     A total of 25 minutes of face to face time was spent with patient more than  half of which was spent in counselling about her weight gain,  The untreated sleep apnea  and coordination of care   I have changed Jodi [name]'s traMADol. I am also having her maintain her traZODone, buPROPion, amitriptyline, meloxicam, sertraline, hydrochlorothiazide, clonazePAM, omeprazole, HYDROcodone-acetaminophen, and HYDROcodone-acetaminophen.  Meds ordered this encounter  Medications  . DISCONTD: HYDROcodone-acetaminophen (NORCO) 10-325 MG tablet    Sig: TAKE 1 TABLET BY MOUTH EVERY 8 HOURS AS NEEDED FOR PAIN, MAXIMUM 2 DAILY    Dispense:  60 tablet    Refill:  0    May refill on or after February 17 2017  . HYDROcodone-acetaminophen (NORCO) 10-325 MG tablet    Sig: TAKE 1 TABLET BY MOUTH EVERY 8 HOURS AS NEEDED FOR PAIN, MAXIMUM 2 DAILY    Dispense:  60 tablet    Refill:  0    MAY REFILL ON OR AFTER March 17 2017  . HYDROcodone-acetaminophen (NORCO) 10-325 MG tablet    Sig: TAKE 1 TABLET BY MOUTH EVERY 8 HOURS AS NEEDED FOR PAIN, MAXIMUM 2 DAILY    Dispense:  60 tablet    Refill:  0    May refill on or after April 17 2017  . traMADol (ULTRAM) 50 MG tablet    Sig: Take 2 tablets (100 mg total) by mouth every 8 (eight) hours as needed. for pain    Dispense:  180 tablet    Refill:  2    May refill on or after March 10 2017    Medications Discontinued During This Encounter  Medication Reason  .  HYDROcodone-acetaminophen (NORCO) 10-325 MG tablet Reorder  . HYDROcodone-acetaminophen (NORCO) 10-325 MG tablet Reorder  . HYDROcodone-acetaminophen (NORCO) 10-325 MG tablet Reorder  . traMADol (ULTRAM) 50 MG tablet Reorder    Follow-up: Return in about 3 months (around 05/12/2017) for med refill,  obesity ,  follow up.   [name], MD

## 2017-02-14 ENCOUNTER — Telehealth: Payer: Self-pay

## 2017-02-14 NOTE — Telephone Encounter (Signed)
PA's have been submitted for both Tramadol and Hydrocodone on covermymeds.

## 2017-02-15 NOTE — Assessment & Plan Note (Signed)
Historically well-controlled on diet alone since her gastric bypass  In 2011.  hemoglobin A1c has been consistently at or  less than 6.0 .  Patient is NOT up-to-date on eye exams and foot exam is normal today. Patient has no microalbuminuria. Patient is tolerating statin therapy for CAD risk reduction and on ACE/ARB for renal protection and hypertension  Lab Results  Component Value Date   HGBA1C 5.6 11/11/2016   Lab Results  Component Value Date   MICROALBUR 0.9 11/11/2016

## 2017-02-15 NOTE — Assessment & Plan Note (Signed)
Continued weight gain addressed despite bariatric surgery.  Patient advised to start walking daily and get back on her low carb diet.

## 2017-02-15 NOTE — Assessment & Plan Note (Signed)
She reports chronic pain despite Orthopedic evaluation finding no problem with  knee or hip  .  Her weight gain is likely aggravating her condition.  Strongly recommended weight loss with exercise and diet. .  Tramadol and vicodin refilled for 3 months

## 2017-02-15 NOTE — Assessment & Plan Note (Addendum)
Confirmed with 2018 split night test.  She continues to defer treatment despite being advised of the risks of untreated OSA  

## 2017-03-07 NOTE — Telephone Encounter (Addendum)
PA for tramadol has been approved from 01/05/2017 - 01/06/2018.   PA for hydrocodone has been approved from 01/05/2017 - 01/06/2018.

## 2017-03-16 ENCOUNTER — Other Ambulatory Visit: Payer: Self-pay | Admitting: Internal Medicine

## 2017-04-21 ENCOUNTER — Telehealth: Payer: Self-pay | Admitting: Internal Medicine

## 2017-04-21 MED ORDER — AMOXICILLIN 500 MG PO CAPS: 2000.0000 mg | ORAL_CAPSULE | Freq: Once | ORAL | 0 refills | Status: AC

## 2017-04-21 NOTE — Telephone Encounter (Signed)
Please advise 

## 2017-04-21 NOTE — Telephone Encounter (Signed)
I see where patient has past script for amoxicillin (AMOXIL) 500 MG capsule Take 2000 mg one hour prior to dental procedures.

## 2017-04-21 NOTE — Telephone Encounter (Signed)
Copied from [place] [id number]. Topic: Quick Communication - Rx Refill/Question >> Apr 21, 2017 10:05 AM Scherrie Gerlach wrote: Medication:  amoxicillin-clavulanate (AUGMENTIN) 875-125 MG per tablet  Has the patient contacted their pharmacy? yes Pt is having dental work and all her bottem teeth pulled. Pt requesting at least 20 tabs called in.  Pt has to go back several days. [id number] [place] [place] - [address] [phone] (Phone) [phone] (Fax)

## 2017-04-21 NOTE — Telephone Encounter (Signed)
Patient does not need 20 tablets of augmentin for a dental extraction,  Unless the dentist/roral surgeon tells me she has an abscess, she only needs 2000 mg of amoxicillin  (4 tablets ) one hour before the extraction.  [facility]

## 2017-04-22 ENCOUNTER — Encounter: Payer: Self-pay | Admitting: Internal Medicine

## 2017-04-22 NOTE — Telephone Encounter (Signed)
Pt states she believes she was told one tooth for extraction may be abscessed.   She also reports 4 teeth extracted 04/07/17 of which one site is very tender. Instructed pt to alert oral surgeon re: this issue.  Pt is being seen at:  [facility];    Dr. Madaline Savage # [phone].  Extraction is scheduled for "sometime in May."

## 2017-04-22 NOTE — Telephone Encounter (Signed)
Nothing in Epic . If they thought she had an abscessed tooth they should have treated her or contacted me directly

## 2017-04-22 NOTE — Telephone Encounter (Signed)
Advised patient that the amoxicillin was called to pharmacy as prescribed before extraction and if she has abscess to contact dentistry at [facility] they would need to either prescribe additional ABX for abscess or contact PCP for additional medication.

## 2017-04-22 NOTE — Telephone Encounter (Signed)
Left message for patient to return call to office. [facility] nurse may advise patient. 

## 2017-04-23 NOTE — Telephone Encounter (Signed)
This encounter was created in error - please disregard.

## 2017-05-03 ENCOUNTER — Other Ambulatory Visit: Payer: Self-pay | Admitting: Internal Medicine

## 2017-05-09 ENCOUNTER — Other Ambulatory Visit: Payer: Self-pay | Admitting: Internal Medicine

## 2017-05-12 ENCOUNTER — Encounter: Payer: Self-pay | Admitting: Internal Medicine

## 2017-05-12 ENCOUNTER — Ambulatory Visit (INDEPENDENT_AMBULATORY_CARE_PROVIDER_SITE_OTHER): Payer: Medicare HMO | Admitting: Internal Medicine

## 2017-05-12 VITALS — BP 138/78 | HR 89 | Temp 98.1°F | Resp 15 | Ht 65.0 in | Wt 194.0 lb

## 2017-05-12 DIAGNOSIS — J069 Acute upper respiratory infection, unspecified: Secondary | ICD-10-CM

## 2017-05-12 DIAGNOSIS — Z8639 Personal history of other endocrine, nutritional and metabolic disease: Secondary | ICD-10-CM | POA: Diagnosis not present

## 2017-05-12 DIAGNOSIS — E6609 Other obesity due to excess calories: Secondary | ICD-10-CM | POA: Diagnosis not present

## 2017-05-12 DIAGNOSIS — B9789 Other viral agents as the cause of diseases classified elsewhere: Secondary | ICD-10-CM | POA: Diagnosis not present

## 2017-05-12 DIAGNOSIS — M170 Bilateral primary osteoarthritis of knee: Secondary | ICD-10-CM | POA: Diagnosis not present

## 2017-05-12 DIAGNOSIS — E785 Hyperlipidemia, unspecified: Secondary | ICD-10-CM

## 2017-05-12 DIAGNOSIS — E1169 Type 2 diabetes mellitus with other specified complication: Secondary | ICD-10-CM

## 2017-05-12 DIAGNOSIS — Z6834 Body mass index (BMI) 34.0-34.9, adult: Secondary | ICD-10-CM

## 2017-05-12 LAB — COMPREHENSIVE METABOLIC PANEL
ALT: 16 U/L (ref 0–35)
AST: 20 U/L (ref 0–37)
Albumin: 3.6 g/dL (ref 3.5–5.2)
Alkaline Phosphatase: 83 U/L (ref 39–117)
BUN: 16 mg/dL (ref 6–23)
CHLORIDE: 101 meq/L (ref 96–112)
CO2: 31 meq/L (ref 19–32)
Calcium: 9 mg/dL (ref 8.4–10.5)
Creatinine, Ser: 1.13 mg/dL (ref 0.40–1.20)
GFR: 50.52 mL/min — AB (ref >60.00)
Glucose, Bld: 107 mg/dL — ABNORMAL HIGH (ref 70–99)
POTASSIUM: 3.6 meq/L (ref 3.5–5.1)
Sodium: 141 mEq/L (ref 135–145)
Total Bilirubin: 0.4 mg/dL (ref 0.2–1.2)
Total Protein: 6.4 g/dL (ref 6.0–8.3)

## 2017-05-12 LAB — CBC WITH DIFFERENTIAL/PLATELET
BASOS ABS: 0.1 10*3/uL (ref 0.0–0.1)
Basophils Relative: 0.6 % (ref 0.0–3.0)
Eosinophils Absolute: 0.1 10*3/uL (ref 0.0–0.7)
Eosinophils Relative: 0.9 % (ref 0.0–5.0)
HCT: 37.2 % (ref 36.0–46.0)
Hemoglobin: 12.6 g/dL (ref 12.0–15.0)
LYMPHS ABS: 2.1 10*3/uL (ref 0.7–4.0)
Lymphocytes Relative: 19.7 % (ref 12.0–46.0)
MCHC: 33.8 g/dL (ref 30.0–36.0)
MCV: 89.5 fl (ref 78.0–100.0)
MONO ABS: 0.9 10*3/uL (ref 0.1–1.0)
Monocytes Relative: 8.4 % (ref 3.0–12.0)
NEUTROS PCT: 70.4 % (ref 43.0–77.0)
Neutro Abs: 7.4 10*3/uL (ref 1.4–7.7)
PLATELETS: 351 10*3/uL (ref 150.0–400.0)
RBC: 4.15 Mil/uL (ref 3.87–5.11)
RDW: 13.2 % (ref 11.5–15.5)
WBC: 10.5 10*3/uL (ref 4.0–10.5)

## 2017-05-12 LAB — LIPID PANEL
CHOL/HDL RATIO: 3
Cholesterol: 156 mg/dL (ref 0–200)
HDL: 59.4 mg/dL (ref >39.00)
LDL Cholesterol: 72 mg/dL (ref 0–99)
NONHDL: 96.34
Triglycerides: 120 mg/dL (ref 0.0–149.0)
VLDL: 24 mg/dL (ref 0.0–40.0)

## 2017-05-12 LAB — HEMOGLOBIN A1C: HEMOGLOBIN A1C: 5.9 % (ref 4.6–6.5)

## 2017-05-12 MED ORDER — HYDROCODONE-ACETAMINOPHEN 10-325 MG PO TABS: ORAL_TABLET | ORAL | 0 refills | Status: DC

## 2017-05-12 MED ORDER — HYDROCODONE-ACETAMINOPHEN 10-325 MG PO TABS
ORAL_TABLET | ORAL | 0 refills | Status: DC
Start: 2017-05-12 — End: 2017-08-18

## 2017-05-12 MED ORDER — FLUTICASONE PROPIONATE 50 MCG/ACT NA SUSP: 2.0000 | Freq: Every day | NASAL | 6 refills | Status: DC

## 2017-05-12 MED ORDER — TRAMADOL HCL 50 MG PO TABS: 100.0000 mg | ORAL_TABLET | Freq: Three times a day (TID) | ORAL | 2 refills | Status: DC | PRN

## 2017-05-12 MED ORDER — AMOXICILLIN-POT CLAVULANATE 875-125 MG PO TABS: 1.0000 | ORAL_TABLET | Freq: Two times a day (BID) | ORAL | 0 refills | Status: DC

## 2017-05-12 MED ORDER — BENZONATATE 200 MG PO CAPS: 200.0000 mg | ORAL_CAPSULE | Freq: Three times a day (TID) | ORAL | 1 refills | Status: DC | PRN

## 2017-05-12 MED ORDER — PREDNISONE 10 MG PO TABS: ORAL_TABLET | ORAL | 0 refills | Status: DC

## 2017-05-12 NOTE — Progress Notes (Signed)
Subjective:  Patient ID: Jodi [name], female    DOB: 04-16-1945  Age: 72 y.o. MRN: [id number]  CC: The primary encounter diagnosis was Personal history of diabetes mellitus. Diagnoses of Hyperlipidemia associated with type 2 diabetes mellitus (Jodi [name]), Viral URI with cough, Osteoarthritis of both knees, unspecified osteoarthritis type, and Class 1 obesity due to excess calories without serious comorbidity with body mass index (BMI) of 34.0 to 34.9 in adult were also pertinent to this visit.  HPI [name] presents for 1) medication refill and evaluation of cough  1) cough and congestion x 3 weeks with  sputum  And malaise.  No known fevers.  Both son and girlfriend were sick with same. Productive couh,  Sinus drainage with green /yellow drainage,  No sneezing .  No ear pain  no facial pain    2) chronic pain ;  She has DJD of both knees,  Aggravated by weight gain and inactivity. She cannot takei NSAIDs due to history of gasric bypass surgery.  Her pain is managed iwht tramadol and vicodin   Outpatient Medications Prior to Visit  Medication Sig Dispense Refill  . amitriptyline (ELAVIL) 50 MG tablet TAKE 1 TABLET BY MOUTH EVERYDAY AT BEDTIME 90 tablet 1  . buPROPion (WELLBUTRIN SR) 100 MG 12 hr tablet TAKE 1 TABLET BY MOUTH TWICE A DAY 180 tablet 1  . clonazePAM (KLONOPIN) 0.5 MG tablet TAKE 1/2 TABLET BY MOUTH TWICE A DAY 60 tablet 2  . hydrochlorothiazide (HYDRODIURIL) 25 MG tablet TAKE 1 TABLET BY MOUTH EVERY DAY 90 tablet 1  . HYDROcodone-acetaminophen (NORCO) 10-325 MG tablet TAKE 1 TABLET BY MOUTH EVERY 8 HOURS AS NEEDED FOR PAIN, MAXIMUM 2 DAILY 60 tablet 0  . meloxicam (MOBIC) 15 MG tablet TAKE 1 TABLET BY MOUTH DAILY AS NEEDED FOR JOINT PAIN 90 tablet 2  . omeprazole (PRILOSEC) 20 MG capsule TAKE 1 CAPSULE BY MOUTH EVERY DAY 90 capsule 1  . sertraline (ZOLOFT) 100 MG tablet TAKE 1 TABLET BY MOUTH EVERY DAY 90 tablet 1  . traZODone (DESYREL) 100 MG tablet Take 1 tablet (100 mg  total) by mouth 2 (two) times daily. 180 tablet 1  . HYDROcodone-acetaminophen (NORCO) 10-325 MG tablet TAKE 1 TABLET BY MOUTH EVERY 8 HOURS AS NEEDED FOR PAIN, MAXIMUM 2 DAILY 60 tablet 0  . traMADol (ULTRAM) 50 MG tablet Take 2 tablets (100 mg total) by mouth every 8 (eight) hours as needed. for pain 180 tablet 2   No facility-administered medications prior to visit.     Review of Systems;  Patient denies headache, fevers, malaise, unintentional weight loss, skin rash, eye pain, sinus congestion and sinus pain, sore throat, dysphagia,  hemoptysis , cough, dyspnea, wheezing, chest pain, palpitations, orthopnea, edema, abdominal pain, nausea, melena, diarrhea, constipation, flank pain, dysuria, hematuria, urinary  Frequency, nocturia, numbness, tingling, seizures,  Focal weakness, Loss of consciousness,  Tremor, insomnia, depression, anxiety, and suicidal ideation.      Objective:  BP 138/78 (BP Location: Left Arm, Patient Position: Sitting, Cuff Size: Normal)   Pulse 89   Temp 98.1 F (36.7 C) (Oral)   Resp 15   Ht 5\' 5"  (1.651 m)   Wt 194 lb (88 kg)   SpO2 91%   BMI 32.28 kg/m   BP Readings from Last 3 Encounters:  05/12/17 138/78  02/12/17 114/62  11/11/16 128/76    Wt Readings from Last 3 Encounters:  05/12/17 194 lb (88 kg)  02/12/17 199 lb (90.3 kg)  11/11/16  193 lb (87.5 kg)    General appearance: alert, cooperative and appears stated age Ears: normal TM's and external ear canals both ears Throat: lips, mucosa, and tongue normal; teeth and gums normal Neck: no adenopathy, no carotid bruit, supple, symmetrical, trachea midline and thyroid not enlarged, symmetric, no tenderness/mass/nodules Back: symmetric, no curvature. ROM normal. No CVA tenderness. Lungs: clear to auscultation bilaterally Heart: regular rate and rhythm, S1, S2 normal, no murmur, click, rub or gallop Abdomen: soft, non-tender; bowel sounds normal; no masses,  no organomegaly Pulses: 2+ and  symmetric Skin: Skin color, texture, turgor normal. No rashes or lesions Lymph nodes: Cervical, supraclavicular, and axillary nodes normal.  Lab Results  Component Value Date   HGBA1C 5.9 05/12/2017   HGBA1C 5.6 11/11/2016   HGBA1C 5.5 05/09/2016    Lab Results  Component Value Date   CREATININE 1.13 05/12/2017   CREATININE 1.25 (H) 11/11/2016   CREATININE 1.16 05/09/2016    Lab Results  Component Value Date   WBC 10.5 05/12/2017   HGB 12.6 05/12/2017   HCT 37.2 05/12/2017   PLT 351.0 05/12/2017   GLUCOSE 107 (H) 05/12/2017   CHOL 156 05/12/2017   TRIG 120.0 05/12/2017   HDL 59.40 05/12/2017   LDLDIRECT 105.0 05/09/2016   LDLCALC 72 05/12/2017   ALT 16 05/12/2017   AST 20 05/12/2017   NA 141 05/12/2017   K 3.6 05/12/2017   CL 101 05/12/2017   CREATININE 1.13 05/12/2017   BUN 16 05/12/2017   CO2 31 05/12/2017   TSH 4.44 05/09/2016   HGBA1C 5.9 05/12/2017   MICROALBUR 0.9 11/11/2016    No results found.  Assessment & Plan:   Problem List Items Addressed This Visit    Viral URI with cough    URI is most likely viral given the mild HEENT Symptoms  And normal exam.   I have explained that in viral URIS, an antibiotic will not help the symptoms and will increase the risk of developing diarrhea.,  Continue oral and nasal decongestants,prn tylenol 650 mq 8 hrs for aches and pains,  Sinus flushes with Milta Deiters Med's rinse,  prednisone  taper for inflammation, and cough suppressant.   Advised to start antibiotic only if symptoms of bacterial sinusitis occur, and advised to take probiotic if the antibiotic is taken,  For a minimum of 3 weeks.       Relevant Orders   CBC with Differential/Platelet (Completed)   Personal history of diabetes mellitus - Primary    Her A1c continues to rise and she is now prediabetic .  Strongly advised her to lose 19 lbs over the next 6 months      Relevant Orders   Hemoglobin A1c (Completed)   Obesity    Continued weight gain addressed  despite bariatric surgery.  Patient advised to start walking daily and get back on her low carb diet.       DJD (degenerative joint disease) of knee    She continues to have  chronic pain despite Orthopedic evaluation finding no problem with  knee or hip  .  Her weight gain is likely aggravating her condition.  I have strongly recommended weight loss with exercise and diet at each visit   At this point refilling her Vicodin is tantamount to rewarding her for being inactive .  Will plan to start weaning her off of Vicodin at next visit if she has not lost any weight.      Relevant Medications   predniSONE (DELTASONE) 10  MG tablet   HYDROcodone-acetaminophen (NORCO) 10-325 MG tablet   HYDROcodone-acetaminophen (NORCO) 10-325 MG tablet   HYDROcodone-acetaminophen (NORCO) 10-325 MG tablet   traMADol (ULTRAM) 50 MG tablet    Other Visit Diagnoses    Hyperlipidemia associated with type 2 diabetes mellitus ([place])       Relevant Orders   Comprehensive metabolic panel (Completed)   Lipid panel (Completed)      I am having Aashi B. Mancino start on predniSONE, benzonatate, fluticasone, and amoxicillin-clavulanate. I am also having her maintain her traZODone, meloxicam, sertraline, clonazePAM, omeprazole, HYDROcodone-acetaminophen, buPROPion, amitriptyline, hydrochlorothiazide, HYDROcodone-acetaminophen, HYDROcodone-acetaminophen, HYDROcodone-acetaminophen, and traMADol.  Meds ordered this encounter  Medications  . predniSONE (DELTASONE) 10 MG tablet    Sig: 6 tablets on Day 1 , then reduce by 1 tablet daily until gone    Dispense:  21 tablet    Refill:  0  . benzonatate (TESSALON) 200 MG capsule    Sig: Take 1 capsule (200 mg total) by mouth 3 (three) times daily as needed for cough.    Dispense:  60 capsule    Refill:  1  . fluticasone (FLONASE) 50 MCG/ACT nasal spray    Sig: Place 2 sprays into both nostrils daily.    Dispense:  16 g    Refill:  6  . amoxicillin-clavulanate (AUGMENTIN)  875-125 MG tablet    Sig: Take 1 tablet by mouth 2 (two) times daily.    Dispense:  14 tablet    Refill:  0  . HYDROcodone-acetaminophen (NORCO) 10-325 MG tablet    Sig: TAKE 1 TABLET BY MOUTH EVERY 8 HOURS AS NEEDED FOR PAIN, MAXIMUM 2 DAILY    Dispense:  60 tablet    Refill:  0    May refill on or after May 17 2017  . HYDROcodone-acetaminophen (NORCO) 10-325 MG tablet    Sig: TAKE 1 TABLET BY MOUTH EVERY 8 HOURS AS NEEDED FOR PAIN, MAXIMUM 2 DAILY    Dispense:  60 tablet    Refill:  0    May refill on or after June 16, 2017  . HYDROcodone-acetaminophen (NORCO) 10-325 MG tablet    Sig: TAKE 1 TABLET BY MOUTH EVERY 8 HOURS AS NEEDED FOR PAIN, MAXIMUM 2 DAILY    Dispense:  60 tablet    Refill:  0    May refill on or after July 16 2017  . traMADol (ULTRAM) 50 MG tablet    Sig: Take 2 tablets (100 mg total) by mouth every 8 (eight) hours as needed. for pain    Dispense:  180 tablet    Refill:  2  A total of 25 minutes of face to face time was spent with patient more than half of which was spent in counselling about the above mentioned conditions  and coordination of care   Medications Discontinued During This Encounter  Medication Reason  . HYDROcodone-acetaminophen (NORCO) 10-325 MG tablet Reorder  . traMADol (ULTRAM) 50 MG tablet Reorder    Follow-up: Return in about 3 months (around 08/12/2017) for med refill .   [name], MD

## 2017-05-12 NOTE — Patient Instructions (Addendum)
You have a viral syndrome which is causing bronchitis.    The post nasal drip may also be contributing to  your  Cough.  I am prescribing a prednisone  Taper  to manage the inflammation in your bronchial tubes  Benzonatate capsules for cough suppression every 8 hours   I also advise use of the following OTC meds to help with your other symptoms.    generic OTC benadryl 25 mg  One hour before bedtime ,, flush your sinuses  once daily with Milta Deiters Meds sinus sinse   If you are not improving in one week.  You can start the antibiotic (amoxicillin clavulinic acid ) .  However,  If your start the antibiotic ,  Please take a probiotic ( Align, Floraque or Culturelle) while you are on the antibiotic to prevent a serious antibiotic associated diarrhea  Called clostridium dificile colitis as well as  a vaginal yeast infection

## 2017-05-13 DIAGNOSIS — J069 Acute upper respiratory infection, unspecified: Secondary | ICD-10-CM | POA: Insufficient documentation

## 2017-05-13 DIAGNOSIS — B9789 Other viral agents as the cause of diseases classified elsewhere: Secondary | ICD-10-CM

## 2017-05-13 NOTE — Assessment & Plan Note (Signed)
She continues to have  chronic pain despite Orthopedic evaluation finding no problem with  knee or hip  .  Her weight gain is likely aggravating her condition.  I have strongly recommended weight loss with exercise and diet at each visit   At this point refilling her Vicodin is tantamount to rewarding her for being inactive .  Will plan to start weaning her off of Vicodin at next visit if she has not lost any weight. 

## 2017-05-13 NOTE — Assessment & Plan Note (Signed)
URI is most likely viral given the mild HEENT Symptoms  And normal exam.   I have explained that in viral URIS, an antibiotic will not help the symptoms and will increase the risk of developing diarrhea.,  Continue oral and nasal decongestants,prn tylenol 650 mq 8 hrs for aches and pains,  Sinus flushes with Neil Med's rinse,  prednisone  taper for inflammation, and cough suppressant.   Advised to start antibiotic only if symptoms of bacterial sinusitis occur, and advised to take probiotic if the antibiotic is taken,  For a minimum of 3 weeks.  

## 2017-05-13 NOTE — Assessment & Plan Note (Signed)
Continued weight gain addressed despite bariatric surgery.  Patient advised to start walking daily and get back on her low carb diet.

## 2017-05-13 NOTE — Assessment & Plan Note (Signed)
Her A1c continues to rise and she is now prediabetic .  Strongly advised her to lose 19 lbs over the next 6 months 

## 2017-05-20 DIAGNOSIS — R32 Unspecified urinary incontinence: Secondary | ICD-10-CM | POA: Diagnosis not present

## 2017-05-20 DIAGNOSIS — R05 Cough: Secondary | ICD-10-CM | POA: Diagnosis not present

## 2017-05-20 DIAGNOSIS — G8929 Other chronic pain: Secondary | ICD-10-CM | POA: Diagnosis not present

## 2017-05-20 DIAGNOSIS — R609 Edema, unspecified: Secondary | ICD-10-CM | POA: Diagnosis not present

## 2017-05-20 DIAGNOSIS — E669 Obesity, unspecified: Secondary | ICD-10-CM | POA: Diagnosis not present

## 2017-05-20 DIAGNOSIS — K219 Gastro-esophageal reflux disease without esophagitis: Secondary | ICD-10-CM | POA: Diagnosis not present

## 2017-05-20 DIAGNOSIS — R69 Illness, unspecified: Secondary | ICD-10-CM | POA: Diagnosis not present

## 2017-05-20 DIAGNOSIS — I10 Essential (primary) hypertension: Secondary | ICD-10-CM | POA: Diagnosis not present

## 2017-05-20 DIAGNOSIS — Z791 Long term (current) use of non-steroidal anti-inflammatories (NSAID): Secondary | ICD-10-CM | POA: Diagnosis not present

## 2017-05-20 DIAGNOSIS — Z6832 Body mass index (BMI) 32.0-32.9, adult: Secondary | ICD-10-CM | POA: Diagnosis not present

## 2017-05-26 ENCOUNTER — Ambulatory Visit (INDEPENDENT_AMBULATORY_CARE_PROVIDER_SITE_OTHER): Payer: Medicare HMO

## 2017-05-26 VITALS — BP 112/64 | HR 86 | Temp 98.3°F | Resp 15 | Ht 64.5 in | Wt 195.4 lb

## 2017-05-26 DIAGNOSIS — Z0000 Encounter for general adult medical examination without abnormal findings: Secondary | ICD-10-CM

## 2017-05-26 NOTE — Progress Notes (Signed)
Subjective:   HARSHINI TRENT is a 72 y.o. female who presents for Medicare Annual (Subsequent) preventive examination.  Review of Systems:  No ROS.  Medicare Wellness Visit. Additional risk factors are reflected in the social history. Cardiac Risk Factors include: advanced age (>35men, >46 women);obesity (BMI >30kg/m2)     Objective:     Vitals: BP 112/64 (BP Location: Right Arm, Patient Position: Sitting, Cuff Size: Large)   Pulse 86   Temp 98.3 F (36.8 C) (Oral)   Resp 15   Ht 5' 4.5" (1.638 m)   Wt 195 lb 6.4 oz (88.6 kg)   SpO2 98%   BMI 33.02 kg/m   Body mass index is 33.02 kg/m.  Advanced Directives 05/26/2017 07/16/2016 05/23/2016  Does Patient Have a Medical Advance Directive? No No No  Would patient like information on creating a medical advance directive? Yes (MAU/Ambulatory/Procedural Areas - Information given) No - Patient declined Yes (MAU/Ambulatory/Procedural Areas - Information given)    Tobacco Social History   Tobacco Use  Smoking Status Never Smoker  Smokeless Tobacco Never Used     Counseling given: Not Answered   Clinical Intake:  Pre-visit preparation completed: Yes  Pain : No/denies pain     Nutritional Status: BMI > 30  Obese Diabetes: No  How often do you need to have someone help you when you read instructions, pamphlets, or other written materials from your doctor or pharmacy?: 1 - Never  Interpreter Needed?: No     Past Medical History:  Diagnosis Date  . Anxiety   . Arthritis   . Diabetes mellitus without complication ([place])    diet controlled since gastric bypass  . Hypertension   . Sleep apnea    Past Surgical History:  Procedure Laterality Date  . BLADDER SUSPENSION    . CHOLECYSTECTOMY    . COLONOSCOPY WITH PROPOFOL N/A 07/16/2016   Procedure: COLONOSCOPY WITH PROPOFOL;  Surgeon: Jonathon Bellows, MD;  Location: [facility] ENDOSCOPY;  Service: Endoscopy;  Laterality: N/A;  . JOINT REPLACEMENT     left hip, right knee  .  ROUX-EN-Y PROCEDURE  2010  . tracheotomy     at age 45 due to respiratroy failure   Family History  Problem Relation Age of Onset  . COPD Mother   . Hypertension Mother   . Ovarian cancer Mother 74       ovarian CA  . Breast cancer Daughter   . Breast cancer Paternal Aunt   . Breast cancer Paternal Grandmother    Social History   Socioeconomic History  . Marital status: Widowed    Spouse name: Not on file  . Number of children: Not on file  . Years of education: Not on file  . Highest education level: Not on file  Occupational History  . Not on file  Social Needs  . Financial resource strain: Not hard at all  . Food insecurity:    Worry: Never true    Inability: Never true  . Transportation needs:    Medical: No    Non-medical: No  Tobacco Use  . Smoking status: Never Smoker  . Smokeless tobacco: Never Used  Substance and Sexual Activity  . Alcohol use: No  . Drug use: No  . Sexual activity: Never  Lifestyle  . Physical activity:    Days per week: Not on file    Minutes per session: Not on file  . Stress: Not on file  Relationships  . Social connections:    Talks  on phone: Not on file    Gets together: Not on file    Attends religious service: Not on file    Active member of club or organization: Not on file    Attends meetings of clubs or organizations: Not on file    Relationship status: Not on file  Other Topics Concern  . Not on file  Social History Narrative  . Not on file    Outpatient Encounter Medications as of 05/26/2017  Medication Sig  . amitriptyline (ELAVIL) 50 MG tablet TAKE 1 TABLET BY MOUTH EVERYDAY AT BEDTIME  . amoxicillin-clavulanate (AUGMENTIN) 875-125 MG tablet Take 1 tablet by mouth 2 (two) times daily.  . benzonatate (TESSALON) 200 MG capsule Take 1 capsule (200 mg total) by mouth 3 (three) times daily as needed for cough.  Marland Kitchen buPROPion (WELLBUTRIN SR) 100 MG 12 hr tablet TAKE 1 TABLET BY MOUTH TWICE A DAY  . clonazePAM (KLONOPIN)  0.5 MG tablet TAKE 1/2 TABLET BY MOUTH TWICE A DAY  . fluticasone (FLONASE) 50 MCG/ACT nasal spray Place 2 sprays into both nostrils daily.  . hydrochlorothiazide (HYDRODIURIL) 25 MG tablet TAKE 1 TABLET BY MOUTH EVERY DAY  . HYDROcodone-acetaminophen (NORCO) 10-325 MG tablet TAKE 1 TABLET BY MOUTH EVERY 8 HOURS AS NEEDED FOR PAIN, MAXIMUM 2 DAILY  . HYDROcodone-acetaminophen (NORCO) 10-325 MG tablet TAKE 1 TABLET BY MOUTH EVERY 8 HOURS AS NEEDED FOR PAIN, MAXIMUM 2 DAILY  . HYDROcodone-acetaminophen (NORCO) 10-325 MG tablet TAKE 1 TABLET BY MOUTH EVERY 8 HOURS AS NEEDED FOR PAIN, MAXIMUM 2 DAILY  . HYDROcodone-acetaminophen (NORCO) 10-325 MG tablet TAKE 1 TABLET BY MOUTH EVERY 8 HOURS AS NEEDED FOR PAIN, MAXIMUM 2 DAILY  . meloxicam (MOBIC) 15 MG tablet TAKE 1 TABLET BY MOUTH DAILY AS NEEDED FOR JOINT PAIN  . omeprazole (PRILOSEC) 20 MG capsule TAKE 1 CAPSULE BY MOUTH EVERY DAY  . predniSONE (DELTASONE) 10 MG tablet 6 tablets on Day 1 , then reduce by 1 tablet daily until gone  . sertraline (ZOLOFT) 100 MG tablet TAKE 1 TABLET BY MOUTH EVERY DAY  . traMADol (ULTRAM) 50 MG tablet Take 2 tablets (100 mg total) by mouth every 8 (eight) hours as needed. for pain  . traZODone (DESYREL) 100 MG tablet Take 1 tablet (100 mg total) by mouth 2 (two) times daily.   No facility-administered encounter medications on file as of 05/26/2017.     Activities of Daily Living In your present state of health, do you have any difficulty performing the following activities: 05/26/2017  Hearing? N  Vision? N  Difficulty concentrating or making decisions? N  Walking or climbing stairs? Y  Comment Intermittent pain with L hip and R knee  Dressing or bathing? N  Doing errands, shopping? N  Preparing Food and eating ? N  Using the Toilet? N  In the past six months, have you accidently leaked urine? N  Do you have problems with loss of bowel control? N  Managing your Medications? N  Managing your Finances? N    Housekeeping or managing your Housekeeping? N  Some recent data might be hidden    Patient Care Team: [name], MD as PCP - General (Internal Medicine)    Assessment:   This is a routine wellness examination for Annelyse.  The goal of the wellness visit is to assist the patient how to close the gaps in care and create a preventative care plan for the patient.   The roster of all physicians providing medical  care to patient is listed in the Snapshot section of the chart.  Osteoporosis risk reviewed.    Safety issues reviewed; Smoke and carbon monoxide detectors in the home. No firearms in the home. Wears seatbelts when driving or riding with others. No violence in the home.  They do not have excessive sun exposure.  Discussed the need for sun protection: hats, long sleeves and the use of sunscreen if there is significant sun exposure.  Patient is alert, normal appearance, oriented to person/place/and time.  Correctly identified the president of the [place] and recalls of 2/3 words. Performs simple calculations and can read correct time from watch face. Displays appropriate judgement.  No new identified risk were noted.  No failures at ADL's or IADL's.    BMI- discussed the importance of a healthy diet, water intake and the benefits of aerobic exercise. Educational material provided.   24 hour diet recall: Regular diet  Dental- UTD.  Eye- Visual acuity not assessed per patient preference. Wears corrective lenses.  Sleep patterns- Nocturia x2. Wakes feeling rested. CPAP not in use.  TDAP vaccine deferred per patient preference.  Follow up with insurance.  Educational material provided.  Patient Concerns: None at this time. Follow up with PCP as needed.  Exercise Activities and Dietary recommendations Current Exercise Habits: Home exercise routine, Type of exercise: walking, Time (Minutes): 20, Frequency (Times/Week): 2, Weekly Exercise (Minutes/Week): 40, Intensity:  Mild  Goals    . Increase physical activity     Silver sneaker program        Fall Risk Fall Risk  05/26/2017 08/09/2016 05/23/2016 04/26/2015 02/10/2015  Falls in the past year? No Yes Yes No No  Number falls in past yr: - 1 1 - -  Injury with Fall? - No No - -  Risk for fall due to : - - - - -  Risk for fall due to: Comment - - - - -  Follow up - - Falls prevention discussed - -   Depression Screen PHQ 2/9 Scores 05/26/2017 08/09/2016 05/23/2016 04/26/2015  PHQ - 2 Score 0 0 1 0  PHQ- 9 Score - - 3 -     Cognitive Function MMSE - Mini Mental State Exam 05/26/2017 05/23/2016  Orientation to time 5 5  Orientation to Place 5 5  Registration 3 3  Attention/ Calculation 5 2  Attention/Calculation-comments - Difficulty performing simple calculations  Recall 2 3  Language- name 2 objects 2 2  Language- repeat 1 1  Language- follow 3 step command 3 3  Language- read & follow direction 1 1  Write a sentence 1 1  Copy design 1 1  Total score 29 27        Immunization History  Administered Date(s) Administered  . Influenza, High Dose Seasonal PF 12/08/2012, 02/10/2015, 02/07/2016, 11/11/2016  . Pneumococcal Conjugate-13 12/08/2012  . Pneumococcal Polysaccharide-23 10/22/2008, 02/10/2015  . Zoster 12/08/2009   Screening Tests Health Maintenance  Topic Date Due  . TETANUS/TDAP  10/26/1965  . OPHTHALMOLOGY EXAM  12/16/2013  . FOOT EXAM  02/06/2017  . INFLUENZA VACCINE  08/07/2017  . URINE MICROALBUMIN  11/11/2017  . HEMOGLOBIN A1C  11/12/2017  . MAMMOGRAM  05/30/2018  . COLONOSCOPY  07/17/2019  . DEXA SCAN  Completed  . Hepatitis C Screening  Completed  . PNA vac Low Risk Adult  Completed       Plan:    End of life planning; Advance aging; Advanced directives discussed.    I have personally  reviewed and noted the following in the patient's chart:   . Medical and social history . Use of alcohol, tobacco or illicit drugs  . Current medications and  supplements . Functional ability and status . Nutritional status . Physical activity . Advanced directives . List of other physicians . Hospitalizations, surgeries, and ER visits in previous 12 months . Vitals . Screenings to include cognitive, depression, and falls . Referrals and appointments  In addition, I have reviewed and discussed with patient certain preventive protocols, quality metrics, and best practice recommendations. A written personalized care plan for preventive services as well as general preventive health recommendations were provided to patient.     Varney Biles, LPN  7/34/1936

## 2017-05-26 NOTE — Patient Instructions (Addendum)
  Ms. Mondry , Thank you for taking time to come for your Medicare Wellness Visit. I appreciate your ongoing commitment to your health goals. Please review the following plan we discussed and let me know if I can assist you in the future.   Follow up as needed.    Bring a copy of your [place] and/or Living Will to be scanned into chart once complete.  Have a great day!  These are the goals we discussed: Goals    . Increase physical activity     Silver sneaker program        This is a list of the screening recommended for you and due dates:  Health Maintenance  Topic Date Due  . Tetanus Vaccine  10/26/1965  . Eye exam for diabetics  12/16/2013  . Complete foot exam   02/06/2017  . Flu Shot  08/07/2017  . Urine Protein Check  11/11/2017  . Hemoglobin A1C  11/12/2017  . Mammogram  05/30/2018  . Colon Cancer Screening  07/17/2019  . DEXA scan (bone density measurement)  Completed  .  Hepatitis C: One time screening is recommended by Center for Disease Control  (CDC) for  adults born from [birth year] through 1964.   Completed  . Pneumonia vaccines  Completed

## 2017-06-25 ENCOUNTER — Other Ambulatory Visit: Payer: Self-pay | Admitting: Internal Medicine

## 2017-07-28 ENCOUNTER — Other Ambulatory Visit: Payer: Self-pay | Admitting: Internal Medicine

## 2017-08-01 ENCOUNTER — Other Ambulatory Visit: Payer: Self-pay | Admitting: Internal Medicine

## 2017-08-02 ENCOUNTER — Other Ambulatory Visit: Payer: Self-pay | Admitting: Internal Medicine

## 2017-08-04 NOTE — Telephone Encounter (Signed)
Spoke with pt and she stated that she is taking the zoloft and the amitriptyline.

## 2017-08-04 NOTE — Telephone Encounter (Signed)
Refilled: 02/03/2017 Last OV: 05/12/2017 Next OV: 08/18/2017

## 2017-08-04 NOTE — Telephone Encounter (Signed)
This is the patient  we had a "dc sertraline request" from pharmacy.  Please call patient and let her know that I will not prescribe clonazepam unless she is taking sertraline

## 2017-08-04 NOTE — Telephone Encounter (Signed)
Clonazepam refilled, but since her dose was reduced to 1/2 tablet twice daily last August,  , I have reduced  The quantity to 30

## 2017-08-05 NOTE — Telephone Encounter (Signed)
Spoke with pt to let her know that since her dose was changed to 1/2 tablet last august Dr. Derrel Nip reduced the quantity of her rx to 30 tablets per month. Pt gave a verbal understanding.

## 2017-08-05 NOTE — Telephone Encounter (Signed)
Printed, signed and faxed.  

## 2017-08-18 ENCOUNTER — Encounter: Payer: Self-pay | Admitting: Internal Medicine

## 2017-08-18 ENCOUNTER — Ambulatory Visit (INDEPENDENT_AMBULATORY_CARE_PROVIDER_SITE_OTHER): Payer: Medicare HMO | Admitting: Internal Medicine

## 2017-08-18 VITALS — BP 114/70 | HR 73 | Temp 97.4°F | Resp 16 | Ht 64.5 in | Wt 202.0 lb

## 2017-08-18 DIAGNOSIS — Z8639 Personal history of other endocrine, nutritional and metabolic disease: Secondary | ICD-10-CM

## 2017-08-18 DIAGNOSIS — Z6834 Body mass index (BMI) 34.0-34.9, adult: Secondary | ICD-10-CM

## 2017-08-18 DIAGNOSIS — R7303 Prediabetes: Secondary | ICD-10-CM

## 2017-08-18 DIAGNOSIS — Z9884 Bariatric surgery status: Secondary | ICD-10-CM

## 2017-08-18 DIAGNOSIS — M170 Bilateral primary osteoarthritis of knee: Secondary | ICD-10-CM

## 2017-08-18 DIAGNOSIS — E6609 Other obesity due to excess calories: Secondary | ICD-10-CM | POA: Diagnosis not present

## 2017-08-18 DIAGNOSIS — R635 Abnormal weight gain: Secondary | ICD-10-CM | POA: Diagnosis not present

## 2017-08-18 LAB — COMPREHENSIVE METABOLIC PANEL
ALT: 15 U/L (ref 0–35)
AST: 16 U/L (ref 0–37)
Albumin: 3.9 g/dL (ref 3.5–5.2)
Alkaline Phosphatase: 64 U/L (ref 39–117)
BILIRUBIN TOTAL: 0.5 mg/dL (ref 0.2–1.2)
BUN: 19 mg/dL (ref 6–23)
CO2: 33 meq/L — AB (ref 19–32)
CREATININE: 1.19 mg/dL (ref 0.40–1.20)
Calcium: 9.4 mg/dL (ref 8.4–10.5)
Chloride: 103 mEq/L (ref 96–112)
GFR: 47.55 mL/min — ABNORMAL LOW (ref >60.00)
GLUCOSE: 90 mg/dL (ref 70–99)
Potassium: 4.1 mEq/L (ref 3.5–5.1)
Sodium: 142 mEq/L (ref 135–145)
Total Protein: 6.2 g/dL (ref 6.0–8.3)

## 2017-08-18 LAB — HEMOGLOBIN A1C: HEMOGLOBIN A1C: 5.8 % (ref 4.6–6.5)

## 2017-08-18 LAB — B12 AND FOLATE PANEL
FOLATE: 19.7 ng/mL (ref >5.9)
Vitamin B-12: 240 pg/mL (ref 211–911)

## 2017-08-18 LAB — TSH: TSH: 3.2 u[IU]/mL (ref 0.35–4.50)

## 2017-08-18 MED ORDER — HYDROCODONE-ACETAMINOPHEN 10-325 MG PO TABS: ORAL_TABLET | ORAL | 0 refills | Status: DC

## 2017-08-18 MED ORDER — TRAMADOL HCL 50 MG PO TABS: 100.0000 mg | ORAL_TABLET | Freq: Three times a day (TID) | ORAL | 2 refills | Status: DC | PRN

## 2017-08-18 MED ORDER — HYDROCODONE-ACETAMINOPHEN 10-325 MG PO TABS
ORAL_TABLET | ORAL | 0 refills | Status: DC
Start: 2017-10-01 — End: 2018-05-28

## 2017-08-18 NOTE — Progress Notes (Signed)
Subjective:  Patient ID: Jodi [name], female    DOB: Aug 18, 1945  Age: 72 y.o. MRN: [id number]  CC: The primary encounter diagnosis was History of gastric bypass. Diagnoses of Prediabetes, Weight gain, Class 1 obesity due to excess calories without serious comorbidity with body mass index (BMI) of 34.0 to 34.9 in adult, Osteoarthritis of both knees, unspecified osteoarthritis type, and Personal history of diabetes mellitus were also pertinent to this visit.  HPI Jodi [name] presents for medication refill.  Patient has chronic pain secondary to DKD and arthritis managed with vicodin/tramadol  given history of gastric bypass making NSAIDs c/i  She has not had any ER visits  And has not requested any early refills.  Her Refill history was confirmed via [place] Controlled Substance database by me today during her visit and there have been no prescriptions of controlled substances filled from any providers other than me. .    Weight gain:  Has gained 7 lbs since May . Had reached a nadir of 168 in June 2016 aftr gastric bypass BMI 28  Now  34 lbs   Not exercising,  Walking in the house  Having bottom teeth removed in September . Has alrady had the upper teeth pulled.  Having to eat sot foods bc she can't chew.  Outpatient Medications Prior to Visit  Medication Sig Dispense Refill  . amitriptyline (ELAVIL) 50 MG tablet TAKE 1 TABLET BY MOUTH EVERYDAY AT BEDTIME 90 tablet 1  . amoxicillin-clavulanate (AUGMENTIN) 875-125 MG tablet Take 1 tablet by mouth 2 (two) times daily. 14 tablet 0  . buPROPion (WELLBUTRIN SR) 100 MG 12 hr tablet TAKE 1 TABLET BY MOUTH TWICE A DAY 180 tablet 1  . clonazePAM (KLONOPIN) 0.5 MG tablet TAKE 1/2 TABLET BY MOUTH TWICE A DAY AS NEEDED 30 tablet 2  . fluticasone (FLONASE) 50 MCG/ACT nasal spray Place 2 sprays into both nostrils daily. 16 g 6  . hydrochlorothiazide (HYDRODIURIL) 25 MG tablet TAKE 1 TABLET BY MOUTH EVERY DAY 90 tablet 1  . meloxicam (MOBIC) 15 MG tablet  TAKE 1 TABLET BY MOUTH DAILY AS NEEDED FOR JOINT PAIN 90 tablet 2  . omeprazole (PRILOSEC) 20 MG capsule TAKE 1 CAPSULE BY MOUTH EVERY DAY 90 capsule 1  . sertraline (ZOLOFT) 100 MG tablet TAKE 1 TABLET BY MOUTH EVERY DAY 90 tablet 1  . traZODone (DESYREL) 100 MG tablet TAKE 1 TABLET BY MOUTH TWICE A DAY 180 tablet 1  . HYDROcodone-acetaminophen (NORCO) 10-325 MG tablet TAKE 1 TABLET BY MOUTH EVERY 8 HOURS AS NEEDED FOR PAIN, MAXIMUM 2 DAILY 60 tablet 0  . HYDROcodone-acetaminophen (NORCO) 10-325 MG tablet TAKE 1 TABLET BY MOUTH EVERY 8 HOURS AS NEEDED FOR PAIN, MAXIMUM 2 DAILY 60 tablet 0  . HYDROcodone-acetaminophen (NORCO) 10-325 MG tablet TAKE 1 TABLET BY MOUTH EVERY 8 HOURS AS NEEDED FOR PAIN, MAXIMUM 2 DAILY 60 tablet 0  . HYDROcodone-acetaminophen (NORCO) 10-325 MG tablet TAKE 1 TABLET BY MOUTH EVERY 8 HOURS AS NEEDED FOR PAIN, MAXIMUM 2 DAILY 60 tablet 0  . traMADol (ULTRAM) 50 MG tablet Take 2 tablets (100 mg total) by mouth every 8 (eight) hours as needed. for pain 180 tablet 2  . benzonatate (TESSALON) 200 MG capsule Take 1 capsule (200 mg total) by mouth 3 (three) times daily as needed for cough. (Patient not taking: Reported on 08/18/2017) 60 capsule 1  . predniSONE (DELTASONE) 10 MG tablet 6 tablets on Day 1 , then reduce by 1 tablet daily until gone (  Patient not taking: Reported on 08/18/2017) 21 tablet 0   No facility-administered medications prior to visit.     Review of Systems;  Patient denies headache, fevers, malaise, unintentional weight loss, skin rash, eye pain, sinus congestion and sinus pain, sore throat, dysphagia,  hemoptysis , cough, dyspnea, wheezing, chest pain, palpitations, orthopnea, edema, abdominal pain, nausea, melena, diarrhea, constipation, flank pain, dysuria, hematuria, urinary  Frequency, nocturia, numbness, tingling, seizures,  Focal weakness, Loss of consciousness,  Tremor, insomnia, depression, anxiety, and suicidal ideation.      Objective:  BP 114/70  (BP Location: Left Arm, Patient Position: Sitting, Cuff Size: Large)   Pulse 73   Temp (!) 97.4 F (36.3 C) (Oral)   Resp 16   Ht 5' 4.5" (1.638 m)   Wt 202 lb (91.6 kg)   SpO2 97%   BMI 34.14 kg/m   BP Readings from Last 3 Encounters:  08/18/17 114/70  05/26/17 112/64  05/12/17 138/78    Wt Readings from Last 3 Encounters:  08/18/17 202 lb (91.6 kg)  05/26/17 195 lb 6.4 oz (88.6 kg)  05/12/17 194 lb (88 kg)    General appearance: alert, cooperative and appears stated age Ears: normal TM's and external ear canals both ears Throat: lips, mucosa, and tongue normal; teeth and gums normal Neck: no adenopathy, no carotid bruit, supple, symmetrical, trachea midline and thyroid not enlarged, symmetric, no tenderness/mass/nodules Back: symmetric, no curvature. ROM normal. No CVA tenderness. Lungs: clear to auscultation bilaterally Heart: regular rate and rhythm, S1, S2 normal, no murmur, click, rub or gallop Abdomen: soft, non-tender; bowel sounds normal; no masses,  no organomegaly Pulses: 2+ and symmetric Skin: Skin color, texture, turgor normal. No rashes or lesions Lymph nodes: Cervical, supraclavicular, and axillary nodes normal.  Lab Results  Component Value Date   HGBA1C 5.8 08/18/2017   HGBA1C 5.9 05/12/2017   HGBA1C 5.6 11/11/2016    Lab Results  Component Value Date   CREATININE 1.19 08/18/2017   CREATININE 1.13 05/12/2017   CREATININE 1.25 (H) 11/11/2016    Lab Results  Component Value Date   WBC 10.5 05/12/2017   HGB 12.6 05/12/2017   HCT 37.2 05/12/2017   PLT 351.0 05/12/2017   GLUCOSE 90 08/18/2017   CHOL 156 05/12/2017   TRIG 120.0 05/12/2017   HDL 59.40 05/12/2017   LDLDIRECT 105.0 05/09/2016   LDLCALC 72 05/12/2017   ALT 15 08/18/2017   AST 16 08/18/2017   NA 142 08/18/2017   K 4.1 08/18/2017   CL 103 08/18/2017   CREATININE 1.19 08/18/2017   BUN 19 08/18/2017   CO2 33 (H) 08/18/2017   TSH 3.20 08/18/2017   HGBA1C 5.8 08/18/2017    MICROALBUR 0.9 11/11/2016    No results found.  Assessment & Plan:   Problem List Items Addressed This Visit    History of gastric bypass - Primary   Relevant Orders   B12 and Folate Panel (Completed)   Personal history of diabetes mellitus    Her A1c continues to rise and she is now prediabetic .  Strongly advised her to lose 19 lbs over the next 6 months      Obesity    Weight gain addressed and patient advised to start exercising daily .  DJD (degenerative joint disease) of knee    She continues to have  chronic pain despite Orthopedic evaluation finding no problem with  knee or hip  .  Her weight gain is likely aggravating her condition.  I have strongly recommended weight loss with exercise and diet at each visit   At this point refilling her Vicodin is tantamount to rewarding her for being inactive .  Will plan to start weaning her off of Vicodin at next visit if she has not lost any weight.      Relevant Medications   HYDROcodone-acetaminophen (NORCO) 10-325 MG tablet (Start on 10/01/2017)   HYDROcodone-acetaminophen (NORCO) 10-325 MG tablet (Start on 10/31/2017)   traMADol (ULTRAM) 50 MG tablet    Other Visit Diagnoses    Prediabetes       Relevant Orders   Hemoglobin A1c (Completed)   Comprehensive metabolic panel (Completed)   Weight gain       Relevant Orders   TSH (Completed)      I have discontinued Batul B. Skilling's predniSONE and benzonatate. I am also having her maintain her meloxicam, buPROPion, amitriptyline, hydrochlorothiazide, fluticasone, amoxicillin-clavulanate, sertraline, traZODone, clonazePAM, omeprazole, HYDROcodone-acetaminophen, HYDROcodone-acetaminophen, and traMADol.  Meds ordered this encounter  Medications  . DISCONTD:  HYDROcodone-acetaminophen (NORCO) 10-325 MG tablet    Sig: TAKE 1 TABLET BY MOUTH EVERY 8 HOURS AS NEEDED FOR PAIN, MAXIMUM 2 DAILY    Dispense:  60 tablet    Refill:  0    May refill on or after August 31 2017  . DISCONTD: HYDROcodone-acetaminophen (NORCO) 10-325 MG tablet    Sig: TAKE 1 TABLET BY MOUTH EVERY 8 HOURS AS NEEDED FOR PAIN, MAXIMUM 2 DAILY    Dispense:  60 tablet    Refill:  0    May refill on or after August 25 , 2019  . HYDROcodone-acetaminophen (NORCO) 10-325 MG tablet    Sig: TAKE 1 TABLET BY MOUTH EVERY 8 HOURS AS NEEDED FOR PAIN, MAXIMUM 2 DAILY    Dispense:  60 tablet    Refill:  0    May refill on or after September 25 , 2019  . HYDROcodone-acetaminophen (NORCO) 10-325 MG tablet    Sig: TAKE 1 TABLET BY MOUTH EVERY 8 HOURS AS NEEDED FOR PAIN, MAXIMUM 2 DAILY    Dispense:  60 tablet    Refill:  0    May refill on or after October 31 2017  . traMADol (ULTRAM) 50 MG tablet    Sig: Take 2 tablets (100 mg total) by mouth every 8 (eight) hours as needed. for pain    Dispense:  180 tablet    Refill:  2    Medications Discontinued During This Encounter  Medication Reason  . benzonatate (TESSALON) 200 MG capsule Patient has not taken in last 30 days  . predniSONE (DELTASONE) 10 MG tablet Completed Course  . HYDROcodone-acetaminophen (NORCO) 10-325 MG tablet   . HYDROcodone-acetaminophen (NORCO) 10-325 MG tablet Reorder  . HYDROcodone-acetaminophen (NORCO) 10-325 MG tablet Reorder  . HYDROcodone-acetaminophen (NORCO) 10-325 MG tablet   . HYDROcodone-acetaminophen (NORCO) 10-325 MG tablet Reorder  . HYDROcodone-acetaminophen (NORCO) 10-325 MG tablet   . traMADol (ULTRAM) 50 MG tablet Reorder   A total of 25 minutes of face to face time was spent with patient more than half of which was spent in counselling about the above mentioned conditions  and coordination of care  Follow-up: Return in about 3 months (around 11/18/2017) for med refill,  weight management  .  [name], MD

## 2017-08-18 NOTE — Patient Instructions (Addendum)
Your joint pain is aggravated by INACTIVITY.  YOUR WEIGHT GAIN IS DUE TO INACTIVITY .  You have gained 7 lbs since last visit  You have gained 34 lbs since you reached your low weight after your gastric bypass   this is the LAST TIME I will refill your Vicodin for 3 months UNLESS you start exercising on a regular basi and losing weight.     WHEN YOU HAVE YOUR TEETH PULLED ,  You should use PREMIER PROTEIN SHAKES   To maintain  Your nutrition and HELP YOU LOSE WEIGHT.  THE "ENSURE MAX PROTEIN" IS ALSO A GOOD ALTERNATIVE

## 2017-08-19 NOTE — Assessment & Plan Note (Signed)
Her A1c continues to rise and she is now prediabetic .  Strongly advised her to lose 19 lbs over the next 6 months

## 2017-08-19 NOTE — Assessment & Plan Note (Signed)
She continues to have  chronic pain despite Orthopedic evaluation finding no problem with  knee or hip  .  Her weight gain is likely aggravating her condition.  I have strongly recommended weight loss with exercise and diet at each visit   At this point refilling her Vicodin is tantamount to rewarding her for being inactive .  Will plan to start weaning her off of Vicodin at next visit if she has not lost any weight.

## 2017-08-19 NOTE — Assessment & Plan Note (Signed)
Weight gain addressed and patient advised to start exercising daily .

## 2017-09-23 DIAGNOSIS — R69 Illness, unspecified: Secondary | ICD-10-CM | POA: Diagnosis not present

## 2017-09-25 ENCOUNTER — Other Ambulatory Visit: Payer: Self-pay | Admitting: Internal Medicine

## 2017-09-30 ENCOUNTER — Other Ambulatory Visit: Payer: Self-pay | Admitting: Internal Medicine

## 2017-10-31 ENCOUNTER — Other Ambulatory Visit: Payer: Self-pay | Admitting: Internal Medicine

## 2017-11-04 ENCOUNTER — Other Ambulatory Visit: Payer: Self-pay | Admitting: Internal Medicine

## 2017-11-10 ENCOUNTER — Other Ambulatory Visit: Payer: Self-pay | Admitting: Internal Medicine

## 2017-11-14 ENCOUNTER — Other Ambulatory Visit: Payer: Self-pay | Admitting: Internal Medicine

## 2017-11-17 NOTE — Telephone Encounter (Signed)
Refilled: 08/04/2017 Last OV: 08/18/2017 Next OV: 11/19/2017

## 2017-11-19 ENCOUNTER — Encounter: Payer: Self-pay | Admitting: Internal Medicine

## 2017-11-19 ENCOUNTER — Ambulatory Visit (INDEPENDENT_AMBULATORY_CARE_PROVIDER_SITE_OTHER): Payer: Medicare HMO | Admitting: Internal Medicine

## 2017-11-19 VITALS — BP 134/82 | HR 69 | Temp 97.6°F | Resp 15 | Ht 65.0 in | Wt 201.5 lb

## 2017-11-19 DIAGNOSIS — Z6833 Body mass index (BMI) 33.0-33.9, adult: Secondary | ICD-10-CM

## 2017-11-19 DIAGNOSIS — E6609 Other obesity due to excess calories: Secondary | ICD-10-CM | POA: Diagnosis not present

## 2017-11-19 DIAGNOSIS — M170 Bilateral primary osteoarthritis of knee: Secondary | ICD-10-CM

## 2017-11-19 DIAGNOSIS — Z1231 Encounter for screening mammogram for malignant neoplasm of breast: Secondary | ICD-10-CM | POA: Diagnosis not present

## 2017-11-19 DIAGNOSIS — E559 Vitamin D deficiency, unspecified: Secondary | ICD-10-CM

## 2017-11-19 DIAGNOSIS — Z8639 Personal history of other endocrine, nutritional and metabolic disease: Secondary | ICD-10-CM

## 2017-11-19 DIAGNOSIS — Z0000 Encounter for general adult medical examination without abnormal findings: Secondary | ICD-10-CM | POA: Diagnosis not present

## 2017-11-19 DIAGNOSIS — R7303 Prediabetes: Secondary | ICD-10-CM | POA: Diagnosis not present

## 2017-11-19 DIAGNOSIS — Z23 Encounter for immunization: Secondary | ICD-10-CM | POA: Diagnosis not present

## 2017-11-19 LAB — VITAMIN D 25 HYDROXY (VIT D DEFICIENCY, FRACTURES): VITD: 45.5 ng/mL (ref 30.00–100.00)

## 2017-11-19 LAB — COMPREHENSIVE METABOLIC PANEL
ALBUMIN: 4.2 g/dL (ref 3.5–5.2)
ALK PHOS: 79 U/L (ref 39–117)
ALT: 13 U/L (ref 0–35)
AST: 18 U/L (ref 0–37)
BUN: 21 mg/dL (ref 6–23)
CO2: 34 mEq/L — ABNORMAL HIGH (ref 19–32)
CREATININE: 1.18 mg/dL (ref 0.40–1.20)
Calcium: 9 mg/dL (ref 8.4–10.5)
Chloride: 102 mEq/L (ref 96–112)
GFR: 47.98 mL/min — ABNORMAL LOW (ref >60.00)
Glucose, Bld: 98 mg/dL (ref 70–99)
Potassium: 4.1 mEq/L (ref 3.5–5.1)
SODIUM: 140 meq/L (ref 135–145)
TOTAL PROTEIN: 6.4 g/dL (ref 6.0–8.3)
Total Bilirubin: 0.6 mg/dL (ref 0.2–1.2)

## 2017-11-19 LAB — LIPID PANEL
CHOLESTEROL: 203 mg/dL — AB (ref 0–200)
HDL: 76.4 mg/dL (ref >39.00)
LDL Cholesterol: 107 mg/dL — ABNORMAL HIGH (ref 0–99)
NonHDL: 126.84
Total CHOL/HDL Ratio: 3
Triglycerides: 99 mg/dL (ref 0.0–149.0)
VLDL: 19.8 mg/dL (ref 0.0–40.0)

## 2017-11-19 LAB — MICROALBUMIN / CREATININE URINE RATIO
CREATININE, U: 103.4 mg/dL
Microalb Creat Ratio: 0.7 mg/g (ref 0.0–30.0)

## 2017-11-19 LAB — HEMOGLOBIN A1C: Hgb A1c MFr Bld: 5.5 % (ref 4.6–6.5)

## 2017-11-19 NOTE — Progress Notes (Signed)
Patient ID: Jodi [name], female    DOB: 1946/01/02  Age: 72 y.o. MRN: [id number]  The patient is here for annual PREVENTIVE examination and management of other chronic and acute problems.   The risk factors are reflected in the social history.  The roster of all physicians providing medical care to patient - is listed in the Snapshot section of the chart.  Activities of daily living:  The patient is 100% independent in all ADLs: dressing, toileting, feeding as well as independent mobility  Home safety : The patient has smoke detectors in the home. They wear seatbelts.  There are no firearms at home. There is no violence in the home.   There is no risks for hepatitis, STDs or HIV. There is no   history of blood transfusion. They have no travel history to infectious disease endemic areas of the world.  The patient has seen their dentist in the last six month. They have seen their eye doctor in the last year. They DENY slight hearing difficulty with regard to whispered voices and some television programs.  They have deferred audiologic testing in the last year.  They do not  have excessive sun exposure. Discussed the need for sun protection: hats, long sleeves and use of sunscreen if there is significant sun exposure.   Diet: the importance of a healthy diet is discussed. They do NOT have a healthy diet.  The benefits of regular aerobic exercise were discussed. She walks 3 times per week ,  15 minutes.   Depression screen: there are no signs or vegative symptoms of untreated depression- irritability, change in appetite, anhedonia, sadness/tearfullness.  Cognitive assessment: the patient manages all their financial and personal affairs and is actively engaged. They could relate day,date,year and events; recalled 2/3 objects at 3 minutes; performed clock-face test normally.  The following portions of the patient's history were reviewed and updated as appropriate: allergies, current medications,  past family history, past medical history,  past surgical history, past social history  and problem list.  Visual acuity was not assessed per patient preference since she has regular follow up with her ophthalmologist. Hearing and body mass index were assessed and reviewed.   During the course of the visit the patient was educated and counseled about appropriate screening and preventive services including : fall prevention , diabetes screening, nutrition counseling, colorectal cancer screening, and recommended immunizations.    CC: The primary encounter diagnosis was Personal history of diabetes mellitus. Diagnoses of Prediabetes, Vitamin D deficiency, Encounter for immunization, Class 1 obesity due to excess calories without serious comorbidity with body mass index (BMI) of 33.0 to 33.9 in adult, Osteoarthritis of both knees, unspecified osteoarthritis type, Encounter for screening mammogram for malignant neoplasm of breast, and Encounter for preventive health examination were also pertinent to this visit. Medication refill and follow up  patient was warned at last visit that her dependence on Vicodin was of growing concern combined with her progressive her weight gain due to inactivity.  She has had repeat evaluation of both knees,  The source of her pain with no obvious cause found.  She reports that she has been having some back pain due to picking up pecans every day in her yard.  She is taking Vicodin twice daily and tramadol 100 mg every 8 hours for chronic joint pain .  NSAIDS are contraindicated due to gastric bypass surgery. Refill history confirmed via [place] Controlled Substance databasE, accessed by me today.Marland Kitchen Refill due on or after nov  25  For vicodin #60/month   Obesity: S/p gastric bypass surgery in 2014.  Her weight  before surgery had reached 290 lbs. Reached a nadir of 167 lbs in January 2016.  Has had Steady weight gain since 2017 .  " I don't eat that much."  Had several teeth pulled due  to tooth decay.    Has been eating soft foods for the last several days.  3 were pulled yesterday, has 3 more to go . Diet reviewed :  NOT LOW GLYCEMIC INDEX.  She is eating pizza , chicken pot pies , fresh mango,  avocadoes.  Not drinking protein shakes any more.  Has lost the info on dieting that was given to her years ago after her surgery.  Has started walking 3 days per week about 15 minutes,  rides a stationery bike  a few times  A week .  Estimates that she can do about 15 minutes of walking before she has to rest.      History Jodi [name] has a past medical history of Anxiety, Arthritis, Diabetes mellitus without complication ([place]), Hypertension, and Sleep apnea.   She has a past surgical history that includes Bladder suspension; Cholecystectomy; tracheotomy; Joint replacement; Roux-en-y procedure (2010); and Colonoscopy with propofol (N/A, 07/16/2016).   Her family history includes Breast cancer in her daughter, paternal aunt, and paternal grandmother; COPD in her mother; Hypertension in her mother; Ovarian cancer (age of onset: 68) in her mother.She reports that she has never smoked. She has never used smokeless tobacco. She reports that she does not drink alcohol or use drugs.  Outpatient Medications Prior to Visit  Medication Sig Dispense Refill  . amitriptyline (ELAVIL) 50 MG tablet TAKE 1 TABLET BY MOUTH EVERYDAY AT BEDTIME 90 tablet 1  . amoxicillin-clavulanate (AUGMENTIN) 875-125 MG tablet Take 1 tablet by mouth 2 (two) times daily. 14 tablet 0  . buPROPion (WELLBUTRIN SR) 100 MG 12 hr tablet TAKE 1 TABLET BY MOUTH TWICE A DAY 180 tablet 1  . clonazePAM (KLONOPIN) 0.5 MG tablet TAKE 1/2 TABLET BY MOUTH TWICE A DAY AS NEEDED 30 tablet 0  . fluticasone (FLONASE) 50 MCG/ACT nasal spray Place 2 sprays into both nostrils daily. 16 g 6  . hydrochlorothiazide (HYDRODIURIL) 25 MG tablet TAKE 1 TABLET BY MOUTH EVERY DAY 90 tablet 1  . HYDROcodone-acetaminophen (NORCO) 10-325 MG tablet TAKE 1 TABLET  BY MOUTH EVERY 8 HOURS AS NEEDED FOR PAIN, MAXIMUM 2 DAILY 60 tablet 0  . HYDROcodone-acetaminophen (NORCO) 10-325 MG tablet TAKE 1 TABLET BY MOUTH EVERY 8 HOURS AS NEEDED FOR PAIN, MAXIMUM 2 DAILY 60 tablet 0  . meloxicam (MOBIC) 15 MG tablet TAKE 1 TABLET BY MOUTH DAILY AS NEEDED FOR JOINT PAIN 90 tablet 2  . omeprazole (PRILOSEC) 20 MG capsule TAKE 1 CAPSULE BY MOUTH EVERY DAY 90 capsule 1  . sertraline (ZOLOFT) 100 MG tablet TAKE 1 TABLET BY MOUTH EVERY DAY 90 tablet 1  . traMADol (ULTRAM) 50 MG tablet Take 2 tablets (100 mg total) by mouth every 8 (eight) hours as needed. for pain 180 tablet 2  . traZODone (DESYREL) 100 MG tablet TAKE 1 TABLET BY MOUTH TWICE A DAY 180 tablet 1   No facility-administered medications prior to visit.     Review of Systems   Patient denies headache, fevers, malaise, unintentional weight loss, skin rash, eye pain, sinus congestion and sinus pain, sore throat, dysphagia,  hemoptysis , cough, dyspnea, wheezing, chest pain, palpitations, orthopnea, edema, abdominal pain, nausea, melena,  diarrhea, constipation, flank pain, dysuria, hematuria, urinary  Frequency, nocturia, numbness, tingling, seizures,  Focal weakness, Loss of consciousness,  Tremor, insomnia, depression, anxiety, and suicidal ideation.      Objective:  BP 134/82 (BP Location: Left Arm, Patient Position: Sitting, Cuff Size: Large)   Pulse 69   Temp 97.6 F (36.4 C) (Oral)   Resp 15   Ht 5\' 5"  (1.651 m)   Wt 201 lb 8 oz (91.4 kg)   SpO2 97%   BMI 33.53 kg/m   Physical Exam   General appearance: alert, cooperative and appears stated age Head: Normocephalic, without obvious abnormality, atraumatic Eyes: conjunctivae/corneas clear. PERRL, EOM's intact. Fundi benign. Ears: normal TM's and external ear canals both ears Nose: Nares normal. Septum midline. Mucosa normal. No drainage or sinus tenderness. Throat: lips, mucosa, and tongue normal; poor dentition  And gums  Neck: no adenopathy, no  carotid bruit, no JVD, supple, symmetrical, trachea midline and thyroid not enlarged, symmetric, no tenderness/mass/nodules Lungs: clear to auscultation bilaterally Breasts: normal appearance, no masses or tenderness Heart: regular rate and rhythm, S1, S2 normal, no murmur, click, rub or gallop Abdomen: soft, non-tender; bowel sounds normal; no masses,  no organomegaly Extremities: extremities normal, atraumatic, no cyanosis or edema Pulses: 2+ and symmetric Skin: Skin color, texture, turgor normal. No rashes or lesions Neurologic: Alert and oriented X 3, normal strength and tone. Normal symmetric reflexes. Normal coordination and gait.      Assessment & Plan:   Problem List Items Addressed This Visit    DJD (degenerative joint disease) of knee    She continues to have  chronic pain despite Orthopedic evaluation finding no problem with  knee or hip  .  Her weight gain is likely aggravating her condition.  I have strongly recommended weight loss with exercise and attention to diet.   At this point refilling her Vicodin is tantamount to rewarding her for being inactive .  Will plan to start weaning her off of Vicodin at next visit if she has not lost any weight.      Encounter for preventive health examination    Annual comprehensive preventive exam was done as well as an evaluation and management of chronic conditions .  During the course of the visit the patient was educated and counseled about appropriate screening and preventive services including :  diabetes screening, lipid analysis with projected  10 year  risk for CAD , nutrition counseling, breast, cervical and colorectal cancer screening, and recommended immunizations.  Printed recommendations for health maintenance screenings was given      Obesity    She is  S/p gastric bypass surgery in 2014.  Her weight  before surgery had reached 290 lbs. Reached a nadir of 167 lbs in January 2016.  Has had Steady weight gain since 2017.   Encouraged to resume drinking protein shakes, salads,  Lean  Meats/fish and  STOP eating chicken pot pies.  And start exercising       Relevant Orders   Amb Referral to Nutrition and Diabetic E   Personal history of diabetes mellitus - Primary    She has lowered her A1c to 5.5  Since her last visit.   Lab Results  Component Value Date   HGBA1C 5.5 11/19/2017         Relevant Orders   Hemoglobin A1c (Completed)   Amb Referral to Nutrition and Diabetic E    Other Visit Diagnoses    Prediabetes  Relevant Orders   Comprehensive metabolic panel (Completed)   Lipid panel (Completed)   Microalbumin / creatinine urine ratio (Completed)   Vitamin D deficiency       Relevant Orders   VITAMIN D 25 Hydroxy (Vit-D Deficiency, Fractures) (Completed)   Encounter for immunization       Relevant Orders   Flu vaccine HIGH DOSE PF (Completed)   Encounter for screening mammogram for malignant neoplasm of breast       Relevant Orders   MM 3D SCREEN BREAST BILATERAL      I am having Jodi [name] maintain her fluticasone, amoxicillin-clavulanate, sertraline, traZODone, omeprazole, HYDROcodone-acetaminophen, HYDROcodone-acetaminophen, traMADol, buPROPion, hydrochlorothiazide, meloxicam, amitriptyline, and clonazePAM.  No orders of the defined types were placed in this encounter.   There are no discontinued medications.  Follow-up: Return in about 3 months (around 02/19/2018).   [name], MD

## 2017-11-19 NOTE — Patient Instructions (Addendum)
  I strongly recommend that you get the TDaP vaccine at [facility] .  It is good for ten years for protection against tetanus,  And for lifetime protection against  diptheria and whooping cough   You are not exercising enough, and your diet is not a weight loss diet  .   I am refilling your Vicodin at the same dose for the next 3 months.  If you have not lost 9 lbs by your next visit,  I will start lowering your dose .

## 2017-11-21 NOTE — Assessment & Plan Note (Signed)
She has lowered her A1c to 5.5  Since her last visit.   Lab Results  Component Value Date   HGBA1C 5.5 11/19/2017    

## 2017-11-21 NOTE — Assessment & Plan Note (Signed)
Annual comprehensive preventive exam was done as well as an evaluation and management of chronic conditions .  During the course of the visit the patient was educated and counseled about appropriate screening and preventive services including :  diabetes screening, lipid analysis with projected  10 year  risk for CAD , nutrition counseling, breast, cervical and colorectal cancer screening, and recommended immunizations.  Printed recommendations for health maintenance screenings was given 

## 2017-11-21 NOTE — Assessment & Plan Note (Addendum)
She is  S/p gastric bypass surgery in 2014.  Her weight  before surgery had reached 290 lbs. Reached a nadir of 167 lbs in January 2016.  Has had Steady weight gain since 2017.  Encouraged to resume drinking protein shakes, salads,  Lean  Meats/fish and  STOP eating chicken pot pies.  And start exercising

## 2017-11-21 NOTE — Assessment & Plan Note (Signed)
She continues to have  chronic pain despite Orthopedic evaluation finding no problem with  knee or hip  .  Her weight gain is likely aggravating her condition.  I have strongly recommended weight loss with exercise and attention to diet.   At this point refilling her Vicodin is tantamount to rewarding her for being inactive .  Will plan to start weaning her off of Vicodin at next visit if she has not lost any weight.

## 2017-12-18 ENCOUNTER — Other Ambulatory Visit: Payer: Self-pay | Admitting: Internal Medicine

## 2017-12-21 ENCOUNTER — Other Ambulatory Visit: Payer: Self-pay | Admitting: Internal Medicine

## 2017-12-22 ENCOUNTER — Other Ambulatory Visit: Payer: Self-pay | Admitting: Internal Medicine

## 2017-12-29 ENCOUNTER — Other Ambulatory Visit: Payer: Self-pay | Admitting: Internal Medicine

## 2017-12-29 NOTE — Telephone Encounter (Signed)
Copied from [place] [id number]. Topic: Quick Communication - Rx Refill/Question >> Dec 29, 2017  2:53 PM Waldemar Dickens, Sade R wrote: Medication: HYDROcodone-acetaminophen (NORCO) 10-325 MG tablet  Has the patient contacted their pharmacy? Yes  Preferred Pharmacy (with phone number or street name): [id number] - [place], [place] [phone] (Phone) [phone] (Fax)    Agent: Please be advised that RX refills may take up to 3 business days. We ask that you follow-up with your pharmacy.

## 2017-12-30 MED ORDER — HYDROCODONE-ACETAMINOPHEN 10-325 MG PO TABS: ORAL_TABLET | ORAL | 0 refills | Status: DC

## 2017-12-30 NOTE — Telephone Encounter (Signed)
Next Refill due on Jan 22   Sent.

## 2018-01-12 ENCOUNTER — Encounter: Payer: Self-pay | Admitting: Internal Medicine

## 2018-01-21 MED ORDER — HYDROCODONE-ACETAMINOPHEN 10-325 MG PO TABS: ORAL_TABLET | ORAL | 0 refills | Status: DC

## 2018-01-21 NOTE — Telephone Encounter (Signed)
Last OV 11/19 last refills last 9/19 ok to fill?

## 2018-01-21 NOTE — Telephone Encounter (Signed)
Pt states the pharmacy never received the refill for  HYDROcodone-acetaminophen (NORCO) 10-325 MG tablet  Pt states she has called several times to check on this. [id number] [place], [place] - [address] [phone] (Phone) [phone] (Fax)   Pt would like a call back.

## 2018-01-21 NOTE — Telephone Encounter (Signed)
Please notify patient that the prescription  was Refilled for 30 days and is not due to be filled until jan 22   OFFICE VISIT NEEDED prior to any more consideration of  refills

## 2018-02-01 ENCOUNTER — Other Ambulatory Visit: Payer: Self-pay | Admitting: Internal Medicine

## 2018-02-03 ENCOUNTER — Other Ambulatory Visit: Payer: Self-pay | Admitting: Internal Medicine

## 2018-02-15 ENCOUNTER — Other Ambulatory Visit: Payer: Self-pay | Admitting: Internal Medicine

## 2018-02-16 NOTE — Telephone Encounter (Signed)
Tramadol refilled.

## 2018-02-16 NOTE — Telephone Encounter (Signed)
Refilled: 08/18/2017 Last OV: 11/19/2017 Next OV: 05/28/2018

## 2018-03-16 ENCOUNTER — Other Ambulatory Visit: Payer: Self-pay | Admitting: Internal Medicine

## 2018-03-16 NOTE — Telephone Encounter (Signed)
Copied from [place] [phone] [id number]. Topic: Quick Communication - Rx Refill/Question >> Mar 16, 2018  1:01 PM [place], [place] D wrote: Medication: HYDROcodone-acetaminophen (NORCO) 10-325 MG tablet / Pt stated she is messed up on her refills. Please advise.  Has the patient contacted their pharmacy? No. (Agent: If no, request that the patient contact the pharmacy for the refill.) (Agent: If yes, when and what did the pharmacy advise?)  Preferred Pharmacy (with phone number or street name): [id number] - [place], [place] [address] [phone] (Phone) [phone] (Fax)    Agent: Please be advised that RX refills may take up to 3 business days. We ask that you follow-up with your pharmacy.

## 2018-03-17 NOTE — Telephone Encounter (Signed)
Per chart last refill on 01/28/18 ok to fill? Last OV 11/19

## 2018-04-01 NOTE — Telephone Encounter (Signed)
Denied.  Patient has not kept 3 month follow up and rx will not be refilled until she has been seen

## 2018-04-01 NOTE — Telephone Encounter (Signed)
Refilled: 01/21/2018 Last OV: 11/19/2017 Next OV: 05/28/2018

## 2018-04-01 NOTE — Telephone Encounter (Signed)
Pt is out of medication, she states she never heard from office.

## 2018-04-01 NOTE — Addendum Note (Signed)
Addended by: [name] on: 04/01/2018 08:31 PM   Modules accepted: Orders

## 2018-04-01 NOTE — Addendum Note (Signed)
Addended by: Adair Laundry on: 04/01/2018 04:59 PM   Modules accepted: Orders

## 2018-05-01 ENCOUNTER — Other Ambulatory Visit: Payer: Self-pay | Admitting: Internal Medicine

## 2018-05-08 ENCOUNTER — Other Ambulatory Visit: Payer: Self-pay | Admitting: Internal Medicine

## 2018-05-11 ENCOUNTER — Other Ambulatory Visit: Payer: Self-pay | Admitting: Internal Medicine

## 2018-05-28 ENCOUNTER — Ambulatory Visit (INDEPENDENT_AMBULATORY_CARE_PROVIDER_SITE_OTHER): Payer: Medicare Other | Admitting: Internal Medicine

## 2018-05-28 ENCOUNTER — Encounter: Payer: Self-pay | Admitting: Internal Medicine

## 2018-05-28 ENCOUNTER — Ambulatory Visit (INDEPENDENT_AMBULATORY_CARE_PROVIDER_SITE_OTHER): Payer: Medicare Other

## 2018-05-28 ENCOUNTER — Other Ambulatory Visit: Payer: Self-pay

## 2018-05-28 DIAGNOSIS — F419 Anxiety disorder, unspecified: Secondary | ICD-10-CM

## 2018-05-28 DIAGNOSIS — K2900 Acute gastritis without bleeding: Secondary | ICD-10-CM

## 2018-05-28 DIAGNOSIS — R1013 Epigastric pain: Secondary | ICD-10-CM | POA: Diagnosis not present

## 2018-05-28 DIAGNOSIS — Z6833 Body mass index (BMI) 33.0-33.9, adult: Secondary | ICD-10-CM

## 2018-05-28 DIAGNOSIS — Z8639 Personal history of other endocrine, nutritional and metabolic disease: Secondary | ICD-10-CM

## 2018-05-28 DIAGNOSIS — Z0000 Encounter for general adult medical examination without abnormal findings: Secondary | ICD-10-CM

## 2018-05-28 DIAGNOSIS — G4733 Obstructive sleep apnea (adult) (pediatric): Secondary | ICD-10-CM | POA: Diagnosis not present

## 2018-05-28 DIAGNOSIS — R296 Repeated falls: Secondary | ICD-10-CM

## 2018-05-28 DIAGNOSIS — E6609 Other obesity due to excess calories: Secondary | ICD-10-CM | POA: Diagnosis not present

## 2018-05-28 DIAGNOSIS — M170 Bilateral primary osteoarthritis of knee: Secondary | ICD-10-CM

## 2018-05-28 MED ORDER — TRAMADOL HCL 50 MG PO TABS: 100.0000 mg | ORAL_TABLET | Freq: Three times a day (TID) | ORAL | 2 refills | Status: DC | PRN

## 2018-05-28 MED ORDER — OMEPRAZOLE 20 MG PO CPDR: 20.0000 mg | DELAYED_RELEASE_CAPSULE | Freq: Two times a day (BID) | ORAL | 1 refills | Status: DC

## 2018-05-28 MED ORDER — HYDROCODONE-ACETAMINOPHEN 10-325 MG PO TABS: ORAL_TABLET | ORAL | 0 refills | Status: DC

## 2018-05-28 MED ORDER — CLONAZEPAM 0.5 MG PO TABS: ORAL_TABLET | ORAL | 5 refills | Status: DC

## 2018-05-28 NOTE — Progress Notes (Signed)
Virtual Visit converted to Telephone Note  This visit type was conducted due to national recommendations for restrictions regarding the COVID-19 pandemic (e.g. social distancing).  This format is felt to be most appropriate for this patient at this time.  All issues noted in this document were discussed and addressed.  No physical exam was performed (except for noted visual exam findings with Video Visits).   I connected with@ on 05/28/18 at  8:30 AM EDT by a video enabled telemedicine application .   Interactive audio and video telecommunications were attempted between this provider and patient, however ultimately  failed, due to patient having technical difficulties .  We continued and completed visit with audio only and verified that I am speaking with the correct person using two identifiers. Location patient: home Location provider:  home office Persons participating in the virtual visit: patient, provider  I discussed the limitations, risks, security and privacy concerns of performing an evaluation and management service by telephone and the availability of in person appointments. I also discussed with the patient that there may be a patient responsible charge related to this service. The patient expressed understanding and agreed to proceed.  Reason for visit: 6 month follow up on GAD, chronic pain, obesity s/p gastric bypass with 35 lb weight regain ,  Type 2 DM in remission   HPI:  The patient has no signs or symptoms of COVID 19 infection (fever, cough, sore throat  or shortness of breath beyond what is typical for patient).  Patient denies contact with other persons with the above mentioned symptoms or with anyone confirmed to have COVID 19   She feels generally well except that she has had two falls in the last month, one she states was due to her left leg "giving out" when she stands up ; the other due to standing up and feeling dizzy.  She has not had any injuries,  And denies any  medication changes.  Last vicodin refill was  Feb 04 2018 .  Using tramadol 6 daily Overdue for mammogram despite November order, says she forgot to schedule   Up to date on colon CA screening  Dentures delayed until august Had nausea and vomiting that started last night after eating 6 spoonfuls of home made beef stew,  Has been having recurrent vomiting if she easts too much. Wt loss of 10 lbs.  Taking omeprazole  But also taking meloxicam daily since her vicodin was discontinued    Sleep apnea : AHI 20 by June 2018 study    ROS: See pertinent positives and negatives per HPI.  Past Medical History:  Diagnosis Date  . Anxiety   . Arthritis   . Diabetes mellitus without complication ([place])    diet controlled since gastric bypass  . Hypertension   . Sleep apnea     Past Surgical History:  Procedure Laterality Date  . BLADDER SUSPENSION    . CHOLECYSTECTOMY    . COLONOSCOPY WITH PROPOFOL N/A 07/16/2016   Procedure: COLONOSCOPY WITH PROPOFOL;  Surgeon: Jonathon Bellows, MD;  Location: [facility] ENDOSCOPY;  Service: Endoscopy;  Laterality: N/A;  . JOINT REPLACEMENT     left hip, right knee  . ROUX-EN-Y PROCEDURE  2010  . tracheotomy     at age 35 due to respiratroy failure    Family History  Problem Relation Age of Onset  . COPD Mother   . Hypertension Mother   . Ovarian cancer Mother 30       ovarian CA  .  Breast cancer Daughter   . Breast cancer Paternal Aunt   . Breast cancer Paternal Grandmother   . Cancer Sister     SOCIAL HX: Social History   Tobacco Use  . Smoking status: Never Smoker  . Smokeless tobacco: Never Used  Substance Use Topics  . Alcohol use: No  . Drug use: No     Current Outpatient Medications:  .  amitriptyline (ELAVIL) 50 MG tablet, TAKE 1 TABLET BY MOUTH EVERYDAY AT BEDTIME, Disp: 90 tablet, Rfl: 1 .  buPROPion (WELLBUTRIN SR) 100 MG 12 hr tablet, TAKE 1 TABLET BY MOUTH TWICE A DAY, Disp: 60 tablet, Rfl: 5 .  clonazePAM (KLONOPIN) 0.5 MG tablet,  TAKE 1/2 TABLET BY MOUTH TWICE DAILY AS NEEDED, Disp: 30 tablet, Rfl: 5 .  fluticasone (FLONASE) 50 MCG/ACT nasal spray, SPRAY 2 SPRAYS INTO EACH NOSTRIL EVERY DAY, Disp: 16 g, Rfl: 2 .  hydrochlorothiazide (HYDRODIURIL) 25 MG tablet, TAKE 1 TABLET BY MOUTH EVERY DAY, Disp: 90 tablet, Rfl: 1 .  omeprazole (PRILOSEC) 20 MG capsule, Take 1 capsule (20 mg total) by mouth 2 (two) times daily before a meal., Disp: 180 capsule, Rfl: 1 .  sertraline (ZOLOFT) 100 MG tablet, TAKE 1 TABLET BY MOUTH EVERY DAY, Disp: 90 tablet, Rfl: 1 .  traMADol (ULTRAM) 50 MG tablet, Take 2 tablets (100 mg total) by mouth every 8 (eight) hours as needed. for pain, Disp: 180 tablet, Rfl: 2 .  traZODone (DESYREL) 100 MG tablet, TAKE 1 TABLET BY MOUTH TWICE A DAY, Disp: 180 tablet, Rfl: 1 .  HYDROcodone-acetaminophen (NORCO) 10-325 MG tablet, TAKE 1 TABLET BY MOUTH EVERY 8 HOURS AS NEEDED FOR PAIN, MAXIMUM 2 DAILY (Patient not taking: Reported on 05/28/2018), Disp: 60 tablet, Rfl: 0 .  HYDROcodone-acetaminophen (NORCO) 10-325 MG tablet, TAKE 1 TABLET BY MOUTH EVERY 12 HOURS AS NEEDED FOR PAIN, MAXIMUM 2 DAILY, Disp: 60 tablet, Rfl: 0  EXAM:   General impression: alert, cooperative and articulate.  No signs of being in distress  Lungs: speech is fluent sentence length suggests that patient is not short of breath and not punctuated by cough, sneezing or sniffing. Marland Kitchen   Psych: affect normal.  speech is articulate and non pressured .  Denies suicidal thoughts   ASSESSMENT AND PLAN:  Discussed the following assessment and plan:  Personal history of diabetes mellitus - Plan: Hemoglobin A1c, Lipid panel  Epigastric pain - Plan: CBC with Differential/Platelet, Comprehensive metabolic panel  Class 1 obesity due to excess calories without serious comorbidity with body mass index (BMI) of 33.0 to 33.9 in adult  Acute gastritis without hemorrhage, unspecified gastritis type  OSA (obstructive sleep apnea)  Frequent  falls  Osteoarthritis of both knees, unspecified osteoarthritis type  Anxiety  Obesity She is  S/p gastric bypass surgery in 2014.  Her weight  before surgery had reached 290 lbs. Reached a nadir of 167 lbs in January 2016.  Has  Lost 10 b since last visit due to loss of appetite .  Encouraged to resume drinking protein shakes, salads,  Lean  Meats/fish .  And start exercising   Gastritis Likely secondary to NSAID use .  NSAID stopped and Protonix started   OSA (obstructive sleep apnea) Confirmed with 2018 split night test.  She continues to defer treatment despite being advised of the risks of untreated OSA   Frequent falls Various etiologies suggested by history.  Pt needed for left leg weakness   DJD (degenerative joint disease) of knee Adding back vicodin  for pain control .  Max 2 daily Refill history confirmed via [place] Controlled Substance databas, accessed by me today..  Personal history of diabetes mellitus She has lowered her A1c to 5.5  Since her last visit.   Lab Results  Component Value Date   HGBA1C 5.5 11/19/2017     Anxiety Managed with  clonazepam dose to 0.25 mg once daily given her concurrent use of tramadol and vicodin. Continue zoloft 100 mg daily     I discussed the assessment and treatment plan with the patient. The patient was provided an opportunity to ask questions and all were answered. The patient agreed with the plan and demonstrated an understanding of the instructions.   The patient was advised to call back or seek an in-person evaluation if the symptoms worsen or if the condition fails to improve as anticipated.  I provided 25 minutes of non-face-to-face time during this encounter.   [name], MD

## 2018-05-28 NOTE — Progress Notes (Addendum)
Subjective:   Jodi [name] is a 73 y.o. female who presents for Medicare Annual (Subsequent) preventive examination.  Review of Systems:  No ROS.  Medicare Wellness Virtual Visit.  Visual/audio telehealth visit, UTA vital signs.   See social history for additional risk factors.  Cardiac Risk Factors include: advanced age (>100men, >42 women)     Objective:     Vitals: There were no vitals taken for this visit.  There is no height or weight on file to calculate BMI.  Advanced Directives 05/28/2018 05/26/2017 07/16/2016 05/23/2016  Does Patient Have a Medical Advance Directive? No No No No  Would patient like information on creating a medical advance directive? Yes (MAU/Ambulatory/Procedural Areas - Information given) Yes (MAU/Ambulatory/Procedural Areas - Information given) No - Patient declined Yes (MAU/Ambulatory/Procedural Areas - Information given)    Tobacco Social History   Tobacco Use  Smoking Status Never Smoker  Smokeless Tobacco Never Used     Counseling given: Not Answered   Clinical Intake:  Pre-visit preparation completed: Yes           How often do you need to have someone help you when you read instructions, pamphlets, or other written materials from your doctor or pharmacy?: 2 - Rarely  Interpreter Needed?: No     Past Medical History:  Diagnosis Date  . Anxiety   . Arthritis   . Diabetes mellitus without complication ([place])    diet controlled since gastric bypass  . Hypertension   . Sleep apnea    Past Surgical History:  Procedure Laterality Date  . BLADDER SUSPENSION    . CHOLECYSTECTOMY    . COLONOSCOPY WITH PROPOFOL N/A 07/16/2016   Procedure: COLONOSCOPY WITH PROPOFOL;  Surgeon: Jonathon Bellows, MD;  Location: [facility] ENDOSCOPY;  Service: Endoscopy;  Laterality: N/A;  . JOINT REPLACEMENT     left hip, right knee  . ROUX-EN-Y PROCEDURE  2010  . tracheotomy     at age 53 due to respiratroy failure   Family History  Problem Relation Age of  Onset  . COPD Mother   . Hypertension Mother   . Ovarian cancer Mother 87       ovarian CA  . Breast cancer Daughter   . Breast cancer Paternal Aunt   . Breast cancer Paternal Grandmother    Social History   Socioeconomic History  . Marital status: Widowed    Spouse name: Not on file  . Number of children: Not on file  . Years of education: Not on file  . Highest education level: Not on file  Occupational History  . Not on file  Social Needs  . Financial resource strain: Not hard at all  . Food insecurity:    Worry: Never true    Inability: Never true  . Transportation needs:    Medical: No    Non-medical: No  Tobacco Use  . Smoking status: Never Smoker  . Smokeless tobacco: Never Used  Substance and Sexual Activity  . Alcohol use: No  . Drug use: No  . Sexual activity: Never  Lifestyle  . Physical activity:    Days per week: Not on file    Minutes per session: Not on file  . Stress: Only a little  Relationships  . Social connections:    Talks on phone: Not on file    Gets together: Not on file    Attends religious service: Not on file    Active member of club or organization: Not on file  Attends meetings of clubs or organizations: Not on file    Relationship status: Not on file  Other Topics Concern  . Not on file  Social History Narrative  . Not on file    Outpatient Encounter Medications as of 05/28/2018  Medication Sig  . amitriptyline (ELAVIL) 50 MG tablet TAKE 1 TABLET BY MOUTH EVERYDAY AT BEDTIME  . buPROPion (WELLBUTRIN SR) 100 MG 12 hr tablet TAKE 1 TABLET BY MOUTH TWICE A DAY  . clonazePAM (KLONOPIN) 0.5 MG tablet TAKE 1/2 TABLET BY MOUTH TWICE DAILY AS NEEDED  . fluticasone (FLONASE) 50 MCG/ACT nasal spray SPRAY 2 SPRAYS INTO EACH NOSTRIL EVERY DAY  . hydrochlorothiazide (HYDRODIURIL) 25 MG tablet TAKE 1 TABLET BY MOUTH EVERY DAY  . HYDROcodone-acetaminophen (NORCO) 10-325 MG tablet TAKE 1 TABLET BY MOUTH EVERY 8 HOURS AS NEEDED FOR PAIN,  MAXIMUM 2 DAILY (Patient not taking: Reported on 05/28/2018)  . omeprazole (PRILOSEC) 20 MG capsule Take 1 capsule (20 mg total) by mouth 2 (two) times daily before a meal.  . sertraline (ZOLOFT) 100 MG tablet TAKE 1 TABLET BY MOUTH EVERY DAY  . traZODone (DESYREL) 100 MG tablet TAKE 1 TABLET BY MOUTH TWICE A DAY   No facility-administered encounter medications on file as of 05/28/2018.     Activities of Daily Living In your present state of health, do you have any difficulty performing the following activities: 05/28/2018  Hearing? N  Vision? N  Difficulty concentrating or making decisions? N  Walking or climbing stairs? Y  Comment Unsteady gait.  Dressing or bathing? N  Doing errands, shopping? N  Preparing Food and eating ? N  Using the Toilet? N  In the past six months, have you accidently leaked urine? N  Do you have problems with loss of bowel control? N  Managing your Medications? N  Managing your Finances? N  Housekeeping or managing your Housekeeping? N  Some recent data might be hidden    Patient Care Team: [name], MD as PCP - General (Internal Medicine)    Assessment:   This is a routine wellness examination for Jodi [name].  I connected with patient 05/28/18 at  9:00 AM EDT by a video/audio enabled telemedicine application and verified that I am speaking with the correct person using two identifiers. Patient stated full name and DOB. Patient gave permission to continue with virtual visit. Patient's location was at home and Nurse's location was at [place] office.   Health Screenings  Mammogram - 05/2016 Colonoscopy - 07/2016 Bone Density - 07/2014 Glaucoma -none Hearing -demonstrates normal hearing during visit. Hemoglobin A1C - 11/2017 Cholesterol - 11/2017 Dental- every 6 months Vision- visits within the last 12 months.  Social  Alcohol intake - no      Smoking history- never    Smokers in home? none Illicit drug use? none Exercise - walking as tolerated,  staying active at home Diet - soft, small bites Sexually Active -never BMI- discussed the importance of a healthy diet, water intake and the benefits of aerobic exercise.  Educational material provided.   Safety  Patient feels safe at home- yes.  Patient does have smoke detectors at home- yes Patient does wear sunscreen or protective clothing when in direct sunlight -yes Patient does wear seat belt when in a moving vehicle -yes Life alert- yes  Covid-19 precautions and sickness symptoms discussed.   Activities of Daily Living Patient denies needing assistance with: driving, household chores, feeding themselves, getting from bed to chair, getting to the  toilet, bathing/showering, dressing, managing money, or preparing meals. Daughter assists as needed.     Depression Screen Patient denies losing interest in daily life, feeling hopeless, or crying easily over simple problems.   Medication-taking as directed and without issues.   Fall Screen Patient denies being afraid of falling or falling in the last year.   Memory Screen Patient is alert.  Patient denies difficulty focusing, concentrating or misplacing items. Correctly identified the president of the [place] , season and recall. Patient likes to play word search for brain stimulation.  Immunizations The following Immunizations were discussed: Influenza, shingles, pneumonia, and tetanus.   Other Providers Patient Care Team: [name], MD as PCP - General (Internal Medicine)  Exercise Activities and Dietary recommendations Current Exercise Habits: The patient does not participate in regular exercise at present  Goals    . Weight (lb) < 200 lb (90.7 kg)       Fall Risk Fall Risk  05/26/2017 08/09/2016 05/23/2016 04/26/2015 02/10/2015  Falls in the past year? No Yes Yes No No  Number falls in past yr: - 1 1 - -  Injury with Fall? - No No - -  Risk for fall due to : - - - - -  Risk for fall due to: Comment - - - - -  Follow  up - - Falls prevention discussed - -   Depression Screen PHQ 2/9 Scores 05/28/2018 05/26/2017 08/09/2016 05/23/2016  PHQ - 2 Score 1 0 0 1  PHQ- 9 Score - - - 3     Cognitive Function MMSE - Mini Mental State Exam 05/26/2017 05/23/2016  Orientation to time 5 5  Orientation to Place 5 5  Registration 3 3  Attention/ Calculation 5 2  Attention/Calculation-comments - Difficulty performing simple calculations  Recall 2 3  Language- name 2 objects 2 2  Language- repeat 1 1  Language- follow 3 step command 3 3  Language- read & follow direction 1 1  Write a sentence 1 1  Copy design 1 1  Total score 29 27     6CIT Screen 05/28/2018  What Year? 0 points  What month? 0 points  What time? 0 points  Count back from 20 0 points  Months in reverse 0 points  Repeat phrase 0 points  Total Score 0    Immunization History  Administered Date(s) Administered  . Influenza, High Dose Seasonal PF 12/08/2012, 02/10/2015, 02/07/2016, 11/11/2016, 11/19/2017  . Pneumococcal Conjugate-13 12/08/2012  . Pneumococcal Polysaccharide-23 10/22/2008, 02/10/2015  . Zoster 12/08/2009   Screening Tests Health Maintenance  Topic Date Due  . TETANUS/TDAP  10/26/1965  . OPHTHALMOLOGY EXAM  12/16/2013  . MAMMOGRAM  05/29/2017  . HEMOGLOBIN A1C  05/20/2018  . INFLUENZA VACCINE  08/08/2018  . FOOT EXAM  11/20/2018  . URINE MICROALBUMIN  11/20/2018  . COLONOSCOPY  07/17/2019  . DEXA SCAN  Completed  . Hepatitis C Screening  Completed  . PNA vac Low Risk Adult  Completed      Plan:    End of life planning; Advance aging; Advanced directives discussed.  Copy of current HCPOA/Living Will requested upon completion.    Await cpap results  Low GI diet mailed  Schedule eye exam  Keep all routine maintenance appontments  I have personally reviewed and noted the following in the patient's chart:   . Medical and social history . Use of alcohol, tobacco or illicit drugs  . Current medications and  supplements . Functional ability and status .  Nutritional status . Physical activity . Advanced directives . List of other physicians . Hospitalizations, surgeries, and ER visits in previous 12 months . Vitals . Screenings to include cognitive, depression, and falls . Referrals and appointments  In addition, I have reviewed and discussed with patient certain preventive protocols, quality metrics, and best practice recommendations. A written personalized care plan for preventive services as well as general preventive health recommendations were provided to patient.     OBrien-Blaney, Danyal Adorno L, LPN  1/[id number]/9390    I have reviewed the above information and agree with above.   Deborra Medina, MD

## 2018-05-28 NOTE — Patient Instructions (Signed)
STOP THE MELOXICAM IMMEDIATELY  INCREASE THE OMEPRAZOLE TO 2 TIMES DAILY ON AN EMPTY STOMACH  I HAVE RESTARTED YOUR PRESCRIPTION FOR VICODIN TO USE TWICE DAILY IF NEEDED FOR PAIN NOT CONTROLLED WITH TRAMADOL  JESSICA WILL CALL YOU WITH AN APPOINTMENT FOR BLOOD WORK  PLEASE GET YOUR MAMMOGRAM DONE

## 2018-05-28 NOTE — Patient Instructions (Addendum)
  Jodi [name] , Thank you for taking time to come for your Medicare Wellness Visit. I appreciate your ongoing commitment to your health goals. Please review the following plan we discussed and let me know if I can assist you in the future.   These are the goals we discussed: Goals    . Weight (lb) < 200 lb (90.7 kg)       This is a list of the screening recommended for you and due dates:  Health Maintenance  Topic Date Due  . Tetanus Vaccine  10/26/1965  . Eye exam for diabetics  12/16/2013  . Mammogram  05/29/2017  . Hemoglobin A1C  05/20/2018  . Flu Shot  08/08/2018  . Complete foot exam   11/20/2018  . Urine Protein Check  11/20/2018  . Colon Cancer Screening  07/17/2019  . DEXA scan (bone density measurement)  Completed  .  Hepatitis C: One time screening is recommended by Center for Disease Control  (CDC) for  adults born from [birth year] through 1964.   Completed  . Pneumonia vaccines  Completed

## 2018-05-29 NOTE — Progress Notes (Signed)
Pt is scheduled for fasting lab work and pt is aware of appt date and time.

## 2018-05-30 ENCOUNTER — Other Ambulatory Visit: Payer: Self-pay | Admitting: Internal Medicine

## 2018-05-31 DIAGNOSIS — R296 Repeated falls: Secondary | ICD-10-CM | POA: Insufficient documentation

## 2018-05-31 DIAGNOSIS — K2970 Gastritis, unspecified, without bleeding: Secondary | ICD-10-CM | POA: Insufficient documentation

## 2018-05-31 NOTE — Assessment & Plan Note (Signed)
Managed with  clonazepam dose to 0.25 mg once daily given her concurrent use of tramadol and vicodin. Continue zoloft 100 mg daily

## 2018-05-31 NOTE — Assessment & Plan Note (Signed)
She has lowered her A1c to 5.5  Since her last visit.   Lab Results  Component Value Date   HGBA1C 5.5 11/19/2017

## 2018-05-31 NOTE — Assessment & Plan Note (Signed)
She is  S/p gastric bypass surgery in 2014.  Her weight  before surgery had reached 290 lbs. Reached a nadir of 167 lbs in January 2016.  Has  Lost 10 b since last visit due to loss of appetite .  Encouraged to resume drinking protein shakes, salads,  Lean  Meats/fish .  And start exercising

## 2018-05-31 NOTE — Assessment & Plan Note (Signed)
Adding back vicodin for pain control .  Max 2 daily Refill history confirmed via [place] Controlled Substance databas, accessed by me today.Marland Kitchen

## 2018-05-31 NOTE — Assessment & Plan Note (Signed)
Likely secondary to NSAID use .  NSAID stopped and Protonix started

## 2018-05-31 NOTE — Assessment & Plan Note (Signed)
Confirmed with 2018 split night test.  She continues to defer treatment despite being advised of the risks of untreated OSA

## 2018-05-31 NOTE — Assessment & Plan Note (Signed)
Various etiologies suggested by history.  Pt needed for left leg weakness

## 2018-06-02 ENCOUNTER — Other Ambulatory Visit: Payer: Self-pay

## 2018-06-02 ENCOUNTER — Other Ambulatory Visit (INDEPENDENT_AMBULATORY_CARE_PROVIDER_SITE_OTHER): Payer: Medicare Other

## 2018-06-02 DIAGNOSIS — Z8639 Personal history of other endocrine, nutritional and metabolic disease: Secondary | ICD-10-CM

## 2018-06-02 DIAGNOSIS — R1013 Epigastric pain: Secondary | ICD-10-CM

## 2018-06-02 LAB — CBC WITH DIFFERENTIAL/PLATELET
Basophils Absolute: 0 10*3/uL (ref 0.0–0.1)
Basophils Relative: 0.7 % (ref 0.0–3.0)
Eosinophils Absolute: 0.1 10*3/uL (ref 0.0–0.7)
Eosinophils Relative: 1.4 % (ref 0.0–5.0)
HCT: 38.6 % (ref 36.0–46.0)
Hemoglobin: 12.9 g/dL (ref 12.0–15.0)
Lymphocytes Relative: 33.6 % (ref 12.0–46.0)
Lymphs Abs: 2.1 10*3/uL (ref 0.7–4.0)
MCHC: 33.4 g/dL (ref 30.0–36.0)
MCV: 92.8 fl (ref 78.0–100.0)
Monocytes Absolute: 0.6 10*3/uL (ref 0.1–1.0)
Monocytes Relative: 9 % (ref 3.0–12.0)
Neutro Abs: 3.5 10*3/uL (ref 1.4–7.7)
Neutrophils Relative %: 55.3 % (ref 43.0–77.0)
Platelets: 243 10*3/uL (ref 150.0–400.0)
RBC: 4.16 Mil/uL (ref 3.87–5.11)
RDW: 13.2 % (ref 11.5–15.5)
WBC: 6.3 10*3/uL (ref 4.0–10.5)

## 2018-06-02 LAB — COMPREHENSIVE METABOLIC PANEL
ALT: 13 U/L (ref 0–35)
AST: 16 U/L (ref 0–37)
Albumin: 4 g/dL (ref 3.5–5.2)
Alkaline Phosphatase: 71 U/L (ref 39–117)
BUN: 20 mg/dL (ref 6–23)
CO2: 33 mEq/L — ABNORMAL HIGH (ref 19–32)
Calcium: 8.9 mg/dL (ref 8.4–10.5)
Chloride: 102 mEq/L (ref 96–112)
Creatinine, Ser: 1.34 mg/dL — ABNORMAL HIGH (ref 0.40–1.20)
GFR: 38.92 mL/min — ABNORMAL LOW (ref >60.00)
Glucose, Bld: 97 mg/dL (ref 70–99)
Potassium: 3.4 mEq/L — ABNORMAL LOW (ref 3.5–5.1)
Sodium: 141 mEq/L (ref 135–145)
Total Bilirubin: 0.4 mg/dL (ref 0.2–1.2)
Total Protein: 6 g/dL (ref 6.0–8.3)

## 2018-06-02 LAB — LIPID PANEL
Cholesterol: 166 mg/dL (ref 0–200)
HDL: 69.4 mg/dL (ref >39.00)
LDL Cholesterol: 67 mg/dL (ref 0–99)
NonHDL: 96.89
Total CHOL/HDL Ratio: 2
Triglycerides: 148 mg/dL (ref 0.0–149.0)
VLDL: 29.6 mg/dL (ref 0.0–40.0)

## 2018-06-02 LAB — HEMOGLOBIN A1C: Hgb A1c MFr Bld: 5.7 % (ref 4.6–6.5)

## 2018-06-03 ENCOUNTER — Other Ambulatory Visit: Payer: Self-pay | Admitting: Internal Medicine

## 2018-06-03 DIAGNOSIS — R944 Abnormal results of kidney function studies: Secondary | ICD-10-CM

## 2018-06-10 ENCOUNTER — Other Ambulatory Visit: Payer: Self-pay

## 2018-06-10 ENCOUNTER — Other Ambulatory Visit (INDEPENDENT_AMBULATORY_CARE_PROVIDER_SITE_OTHER): Payer: Medicare Other

## 2018-06-10 DIAGNOSIS — R944 Abnormal results of kidney function studies: Secondary | ICD-10-CM

## 2018-06-10 LAB — RENAL FUNCTION PANEL
Albumin: 4 g/dL (ref 3.5–5.2)
BUN: 19 mg/dL (ref 6–23)
CO2: 32 mEq/L (ref 19–32)
Calcium: 9.1 mg/dL (ref 8.4–10.5)
Chloride: 102 mEq/L (ref 96–112)
Creatinine, Ser: 1.27 mg/dL — ABNORMAL HIGH (ref 0.40–1.20)
GFR: 41.41 mL/min — ABNORMAL LOW (ref >60.00)
Glucose, Bld: 84 mg/dL (ref 70–99)
Phosphorus: 3.8 mg/dL (ref 2.3–4.6)
Potassium: 3.8 mEq/L (ref 3.5–5.1)
Sodium: 142 mEq/L (ref 135–145)

## 2018-06-15 ENCOUNTER — Other Ambulatory Visit: Payer: Self-pay | Admitting: Internal Medicine

## 2018-07-15 ENCOUNTER — Other Ambulatory Visit: Payer: Self-pay | Admitting: Internal Medicine

## 2018-07-15 MED ORDER — HYDROCODONE-ACETAMINOPHEN 10-325 MG PO TABS: ORAL_TABLET | ORAL | 0 refills | Status: DC

## 2018-07-15 NOTE — Telephone Encounter (Signed)
Medication Refill - Medication: HYDROcodone-acetaminophen (NORCO) 10-325 MG tablet  Refill request  Preferred Pharmacy (with phone number or street name):  [id number] [place], [place] [phone] (Phone) [phone] (Fax)

## 2018-07-15 NOTE — Telephone Encounter (Signed)
Refilled: 05/28/2018 Last OV: 05/28/2018 Next OV: 05/31/2019

## 2018-07-28 ENCOUNTER — Other Ambulatory Visit: Payer: Self-pay | Admitting: Internal Medicine

## 2018-08-12 ENCOUNTER — Other Ambulatory Visit: Payer: Self-pay | Admitting: Internal Medicine

## 2018-08-12 ENCOUNTER — Telehealth: Payer: Self-pay | Admitting: Internal Medicine

## 2018-08-12 MED ORDER — HYDROCODONE-ACETAMINOPHEN 10-325 MG PO TABS: ORAL_TABLET | ORAL | 0 refills | Status: DC

## 2018-08-12 NOTE — Telephone Encounter (Signed)
vicodin refilled .  OV needed in August

## 2018-08-12 NOTE — Telephone Encounter (Signed)
Last refill:  07/15/18  Last OV:   05/28/18

## 2018-08-12 NOTE — Telephone Encounter (Signed)
Medication:  HYDROcodone-acetaminophen (NORCO) 10-325 MG tablet    Patient is requesting refill.    Pharmacy:  [id number] - [place], [place] [phone] (Phone) [phone] (Fax)

## 2018-08-13 NOTE — Telephone Encounter (Signed)
Pt scheduled a phone visit for 08/27/18 @ 8:30 am.

## 2018-08-27 ENCOUNTER — Ambulatory Visit (INDEPENDENT_AMBULATORY_CARE_PROVIDER_SITE_OTHER): Payer: Medicare Other | Admitting: Internal Medicine

## 2018-08-27 ENCOUNTER — Encounter: Payer: Self-pay | Admitting: Internal Medicine

## 2018-08-27 ENCOUNTER — Other Ambulatory Visit: Payer: Self-pay

## 2018-08-27 VITALS — BP 120/77 | HR 68 | Wt 182.0 lb

## 2018-08-27 DIAGNOSIS — M170 Bilateral primary osteoarthritis of knee: Secondary | ICD-10-CM | POA: Diagnosis not present

## 2018-08-27 DIAGNOSIS — Z6833 Body mass index (BMI) 33.0-33.9, adult: Secondary | ICD-10-CM

## 2018-08-27 DIAGNOSIS — R296 Repeated falls: Secondary | ICD-10-CM | POA: Diagnosis not present

## 2018-08-27 DIAGNOSIS — R7301 Impaired fasting glucose: Secondary | ICD-10-CM | POA: Diagnosis not present

## 2018-08-27 DIAGNOSIS — Z8639 Personal history of other endocrine, nutritional and metabolic disease: Secondary | ICD-10-CM | POA: Diagnosis not present

## 2018-08-27 DIAGNOSIS — E6609 Other obesity due to excess calories: Secondary | ICD-10-CM

## 2018-08-27 DIAGNOSIS — K2900 Acute gastritis without bleeding: Secondary | ICD-10-CM

## 2018-08-27 DIAGNOSIS — R42 Dizziness and giddiness: Secondary | ICD-10-CM

## 2018-08-27 MED ORDER — HYDROCODONE-ACETAMINOPHEN 10-325 MG PO TABS: 1.0000 | ORAL_TABLET | Freq: Every day | ORAL | 0 refills | Status: DC | PRN

## 2018-08-27 MED ORDER — HYDROCODONE-ACETAMINOPHEN 10-325 MG PO TABS: ORAL_TABLET | ORAL | 0 refills | Status: DC

## 2018-08-27 NOTE — Progress Notes (Signed)
Telephone  Note  This visit type was conducted due to national recommendations for restrictions regarding the COVID-19 pandemic (e.g. social distancing).  This format is felt to be most appropriate for this patient at this time.  All issues noted in this document were discussed and addressed.  No physical exam was performed (except for noted visual exam findings with Video Visits).   I connected with@ on 08/27/18 at  8:30 AM EDT by  telephone and verified that I am speaking with the correct person using two identifiers. Location patient: home Location provider: work or home office Persons participating in the virtual visit: patient, provider  I discussed the limitations, risks, security and privacy concerns of performing an evaluation and management service by telephone and the availability of in person appointments. I also discussed with the patient that there may be a patient responsible charge related to this service. The patient expressed understanding and agreed to proceed.  Reason for visit: medication refill  HPI:  73 yr old female with history of morbid obesity s/p gastric bypass and weight regain of 35 lbs since surgery, chronic knee and hip pain with recent orthopedic evaluation knee requiring use of non NSAID analgesics , presents  for medication refill.   She has been using 6 tramadol daily and one vicodin daily for pain control.Marland Kitchen  Splitting the vicodin in half and taking it bid  Nausea from last visit resolved with suspension of NSAIDs.   Still taking protonix  Did not return for repeat renal function assessment.  Taking hctz,  Still having dizzy spells,  infrequent with sudden position change  Recent weight 182 lbs.    Not walking daily for exercise[place]  . No falls since last visit.    ROS: See pertinent positives and negatives per HPI.  Past Medical History:  Diagnosis Date  . Anxiety   . Arthritis   . Diabetes mellitus without  complication ([place])    diet controlled since gastric bypass  . Hypertension   . Sleep apnea     Past Surgical History:  Procedure Laterality Date  . BLADDER SUSPENSION    . CHOLECYSTECTOMY    . COLONOSCOPY WITH PROPOFOL N/A 07/16/2016   Procedure: COLONOSCOPY WITH PROPOFOL;  Surgeon: Jonathon Bellows, MD;  Location: [facility] ENDOSCOPY;  Service: Endoscopy;  Laterality: N/A;  . JOINT REPLACEMENT     left hip, right knee  . ROUX-EN-Y PROCEDURE  2010  . tracheotomy     at age 73 due to respiratroy failure    Family History  Problem Relation Age of Onset  . COPD Mother   . Hypertension Mother   . Ovarian cancer Mother 1       ovarian CA  . Breast cancer Daughter   . Breast cancer Paternal Aunt   . Breast cancer Paternal Grandmother   . Cancer Sister     SOCIAL HX:  reports that she has never smoked. She has never used smokeless tobacco. She reports that she does not drink alcohol or use drugs.   Current Outpatient Medications:  .  amitriptyline (ELAVIL) 50 MG tablet, TAKE 1 TABLET BY MOUTH EVERYDAY AT BEDTIME, Disp: 90 tablet, Rfl: 1 .  buPROPion (WELLBUTRIN SR) 100 MG 12 hr tablet, TAKE 1 TABLET BY MOUTH TWICE A DAY, Disp: 180 tablet, Rfl: 1 .  clonazePAM (KLONOPIN) 0.5 MG tablet, TAKE 1/2 TABLET BY MOUTH TWICE DAILY AS NEEDED, Disp: 30 tablet, Rfl: 5 .  fluticasone (FLONASE) 50 MCG/ACT  nasal spray, SPRAY 2 SPRAYS INTO EACH NOSTRIL EVERY DAY, Disp: 16 g, Rfl: 2 .  hydrochlorothiazide (HYDRODIURIL) 25 MG tablet, TAKE 1 TABLET BY MOUTH EVERY DAY, Disp: 90 tablet, Rfl: 1 .  [START ON 09/11/2018] HYDROcodone-acetaminophen (NORCO) 10-325 MG tablet, TAKE 1 TABLET BY MOUTH ONCE DAILY AS NEEDED FOR PAIN, MAXIMUM 1 DAILY, Disp: 30 tablet, Rfl: 0 .  omeprazole (PRILOSEC) 20 MG capsule, Take 1 capsule (20 mg total) by mouth 2 (two) times daily before a meal., Disp: 180 capsule, Rfl: 1 .  sertraline (ZOLOFT) 100 MG tablet, TAKE 1 TABLET BY MOUTH EVERY DAY, Disp: 90 tablet, Rfl: 1 .  traMADol (ULTRAM)  50 MG tablet, Take 50 mg by mouth 2 (two) times daily. , Disp: , Rfl:  .  traZODone (DESYREL) 100 MG tablet, TAKE 1 TABLET BY MOUTH TWICE A DAY, Disp: 180 tablet, Rfl: 1 .  [START ON 10/11/2018] HYDROcodone-acetaminophen (NORCO) 10-325 MG tablet, Take 1 tablet by mouth daily as needed., Disp: 30 tablet, Rfl: 0 .  HYDROcodone-acetaminophen (NORCO) 10-325 MG tablet, Take 1 tablet by mouth daily as needed., Disp: 30 tablet, Rfl: 0  EXAM:  VITALS per patient if applicable: BP 496/75   Pulse 68   Wt 182 lb (82.6 kg)   BMI 30.29 kg/m    General impression: alert, cooperative and articulate.  No signs of being in distress  Lungs: speech is fluent sentence length suggests that patient is not short of breath and not punctuated by cough, sneezing or sniffing. Marland Kitchen   Psych: affect normal.  speech is articulate and non pressured .  Denies suicidal thoughts    ASSESSMENT AND PLAN:   Frequent falls She has had none since May  ,  Leg weakness and orthostasis suspected.  Needs RN VISIT FOR ORTHOSTATIC BP MEASUREMENT   Personal history of diabetes mellitus Steadily rising a1c due to weight gain and inactivity. Her  random glucose is not  elevated but her A1c suggests she is at risk for developing diabetes again   I recommend he follow a low glycemic index diet and particpate regularly in an aerobic  exercise activity.  We should check an A1c in 6 months.    Lab Results  Component Value Date   HGBA1C 5.7 06/02/2018     DJD (degenerative joint disease) of knee Adding back vicodin for pain control .  Max 1 daily Refill history confirmed via [place] Controlled Substance database, accessed by me today..  Gastritis secondary to use of NSAIDs .  Now resolved,  Continue PPI   Obesity I have congratulated her in reduction of   BMI and encouraged  Continued weight loss with goal of 10% of body weigh over the next 6 months using a low glycemic index diet and regular exercise a minimum of 5 days per week.       I discussed the assessment and treatment plan with the patient. The patient was provided an opportunity to ask questions and all were answered. The patient agreed with the plan and demonstrated an understanding of the instructions.   The patient was advised to call back or seek an in-person evaluation if the symptoms worsen or if the condition fails to improve as anticipated.  I provided 22 minutes of non-face-to-face time during this encounter.   [name], MD

## 2018-08-27 NOTE — Patient Instructions (Signed)
I AM REFILLING YOUR VICODIN for 3 more months .    YOU NEED TO BE WALKING DAILY FOR AT LEAST 15 MINUTES  YOU NEED A NURSE VISIT IN EARLY September TO CHECK YOUR BLOOD PRESSURE AND BLOODWORK.  YOU CAN RECEIVE YOUR FLU  VACCINE AT THAT TIME AS WELL

## 2018-08-27 NOTE — Assessment & Plan Note (Signed)
She has had none since May  ,  Leg weakness and orthostasis suspected.  Needs RN VISIT FOR ORTHOSTATIC BP MEASUREMENT

## 2018-08-29 NOTE — Assessment & Plan Note (Signed)
I have congratulated her in reduction of   BMI and encouraged  Continued weight loss with goal of 10% of body weigh over the next 6 months using a low glycemic index diet and regular exercise a minimum of 5 days per week.    

## 2018-08-29 NOTE — Assessment & Plan Note (Addendum)
Adding back vicodin for pain control .  Max 1 daily Refill history confirmed via [place] Controlled Substance database, accessed by me today.Marland Kitchen

## 2018-08-29 NOTE — Assessment & Plan Note (Signed)
Steadily rising a1c due to weight gain and inactivity. Her  random glucose is not  elevated but her A1c suggests she is at risk for developing diabetes again   I recommend he follow a low glycemic index diet and particpate regularly in an aerobic  exercise activity.  We should check an A1c in 6 months.    Lab Results  Component Value Date   HGBA1C 5.7 06/02/2018

## 2018-08-29 NOTE — Assessment & Plan Note (Signed)
secondary to use of NSAIDs .  Now resolved,  Continue PPI

## 2018-09-13 ENCOUNTER — Other Ambulatory Visit: Payer: Self-pay | Admitting: Internal Medicine

## 2018-10-15 ENCOUNTER — Other Ambulatory Visit: Payer: Self-pay | Admitting: Internal Medicine

## 2018-10-15 NOTE — Telephone Encounter (Signed)
Refilled: 08/20/2018 Last OV: 08/27/2018 Next OV: 05/31/2019

## 2018-10-23 ENCOUNTER — Other Ambulatory Visit: Payer: Self-pay | Admitting: Internal Medicine

## 2018-10-27 ENCOUNTER — Telehealth: Payer: Self-pay | Admitting: *Deleted

## 2018-10-27 NOTE — Telephone Encounter (Signed)
Copied from [place] [id number]. Topic: General - Inquiry >> Oct 27, 2018 10:22 AM Jodi [name] wrote: Reason for CRM: Pt stated she received an automated message that she may be missing testing or test results. Would like a callback is she needs to have labs or an appt scheduled. Please advise.

## 2018-10-27 NOTE — Telephone Encounter (Signed)
LMTCB. Need to schedule pt a lab appt in December after the 26th, it needs to be a fasting lab appt.

## 2018-11-05 ENCOUNTER — Other Ambulatory Visit: Payer: Self-pay | Admitting: Internal Medicine

## 2018-11-09 ENCOUNTER — Other Ambulatory Visit: Payer: Self-pay | Admitting: Internal Medicine

## 2018-11-11 ENCOUNTER — Other Ambulatory Visit: Payer: Self-pay

## 2018-11-11 ENCOUNTER — Ambulatory Visit (INDEPENDENT_AMBULATORY_CARE_PROVIDER_SITE_OTHER): Payer: Medicare Other | Admitting: Internal Medicine

## 2018-11-11 ENCOUNTER — Encounter: Payer: Self-pay | Admitting: Internal Medicine

## 2018-11-11 VITALS — BP 122/68 | HR 82 | Temp 97.5°F | Resp 15 | Ht 65.0 in | Wt 189.2 lb

## 2018-11-11 DIAGNOSIS — Z23 Encounter for immunization: Secondary | ICD-10-CM

## 2018-11-11 DIAGNOSIS — R42 Dizziness and giddiness: Secondary | ICD-10-CM | POA: Diagnosis not present

## 2018-11-11 DIAGNOSIS — Z6833 Body mass index (BMI) 33.0-33.9, adult: Secondary | ICD-10-CM

## 2018-11-11 DIAGNOSIS — M170 Bilateral primary osteoarthritis of knee: Secondary | ICD-10-CM

## 2018-11-11 DIAGNOSIS — E6609 Other obesity due to excess calories: Secondary | ICD-10-CM

## 2018-11-11 DIAGNOSIS — I499 Cardiac arrhythmia, unspecified: Secondary | ICD-10-CM | POA: Diagnosis not present

## 2018-11-11 DIAGNOSIS — R7301 Impaired fasting glucose: Secondary | ICD-10-CM

## 2018-11-11 DIAGNOSIS — Z1239 Encounter for other screening for malignant neoplasm of breast: Secondary | ICD-10-CM | POA: Diagnosis not present

## 2018-11-11 DIAGNOSIS — Z8639 Personal history of other endocrine, nutritional and metabolic disease: Secondary | ICD-10-CM

## 2018-11-11 LAB — COMPREHENSIVE METABOLIC PANEL
ALT: 15 U/L (ref 0–35)
AST: 18 U/L (ref 0–37)
Albumin: 3.9 g/dL (ref 3.5–5.2)
Alkaline Phosphatase: 69 U/L (ref 39–117)
BUN: 21 mg/dL (ref 6–23)
CO2: 33 mEq/L — ABNORMAL HIGH (ref 19–32)
Calcium: 8.8 mg/dL (ref 8.4–10.5)
Chloride: 103 mEq/L (ref 96–112)
Creatinine, Ser: 1.1 mg/dL (ref 0.40–1.20)
GFR: 48.82 mL/min — ABNORMAL LOW (ref >60.00)
Glucose, Bld: 104 mg/dL — ABNORMAL HIGH (ref 70–99)
Potassium: 4.2 mEq/L (ref 3.5–5.1)
Sodium: 141 mEq/L (ref 135–145)
Total Bilirubin: 0.4 mg/dL (ref 0.2–1.2)
Total Protein: 6.1 g/dL (ref 6.0–8.3)

## 2018-11-11 LAB — VITAMIN B12: Vitamin B-12: 626 pg/mL (ref 211–911)

## 2018-11-11 LAB — LIPID PANEL
Cholesterol: 191 mg/dL (ref 0–200)
HDL: 67.2 mg/dL (ref >39.00)
LDL Cholesterol: 88 mg/dL (ref 0–99)
NonHDL: 123.34
Total CHOL/HDL Ratio: 3
Triglycerides: 178 mg/dL — ABNORMAL HIGH (ref 0.0–149.0)
VLDL: 35.6 mg/dL (ref 0.0–40.0)

## 2018-11-11 LAB — MICROALBUMIN / CREATININE URINE RATIO
Creatinine,U: 110.3 mg/dL
Microalb Creat Ratio: 0.6 mg/g (ref 0.0–30.0)
Microalb, Ur: <0.7 mg/dL (ref 0.0–1.9)

## 2018-11-11 LAB — HEMOGLOBIN A1C: Hgb A1c MFr Bld: 5.6 % (ref 4.6–6.5)

## 2018-11-11 LAB — TSH: TSH: 1.88 u[IU]/mL (ref 0.35–4.50)

## 2018-11-11 MED ORDER — HYDROCODONE-ACETAMINOPHEN 10-325 MG PO TABS: 1.0000 | ORAL_TABLET | Freq: Every day | ORAL | 0 refills | Status: DC | PRN

## 2018-11-11 MED ORDER — BUPROPION HCL ER (SR) 150 MG PO TB12: 100.0000 mg | ORAL_TABLET | Freq: Two times a day (BID) | ORAL | 1 refills | Status: DC

## 2018-11-11 MED ORDER — HYDROCODONE-ACETAMINOPHEN 10-325 MG PO TABS: ORAL_TABLET | ORAL | 0 refills | Status: DC

## 2018-11-11 NOTE — Patient Instructions (Signed)
I Have increased your wellbutrin  (anti depressant) to 150 mg twice daily to helo you through the winter months' and will refill for 90 days if you tolerate it  I have refilled your hydrocodne for 3 months  (Nov 19th   Dec 19 and Jan 18)

## 2018-11-11 NOTE — Assessment & Plan Note (Signed)
Improving  a1c with weight loss    I recommend she follow a low glycemic index diet and particpate regularly in an aerobic  exercise activity.     Lab Results  Component Value Date   HGBA1C 5.6 11/11/2018

## 2018-11-11 NOTE — Assessment & Plan Note (Signed)
I have congratulated her in reduction of   BMI and encouraged  Continued weight loss with goal of 10% of body weigh over the next 6 months using a low glycemic index diet and regular exercise a minimum of 5 days per week.    

## 2018-11-11 NOTE — Assessment & Plan Note (Signed)
Continue once daily vicodin for pain control .  Max 1 daily Refill history confirmed via [place] Controlled Substance database, accessed by me today.Marland Kitchen

## 2018-11-11 NOTE — Progress Notes (Signed)
Subjective:  Patient ID: Jodi [name], female    DOB: [birth year]  Age: 73 y.o. MRN: [id number]  CC: The primary encounter diagnosis was Irregular heart beat. Diagnoses of Encounter for breast cancer screening other than mammogram, Dizziness, Impaired fasting glucose, Need for immunization against influenza, Personal history of diabetes mellitus, Osteoarthritis of both knees, unspecified osteoarthritis type, and Class 1 obesity due to excess calories without serious comorbidity with body mass index (BMI) of 33.0 to 33.9 in adult were also pertinent to this visit.  HPI ELLIEANN SAHL presents for medication refill and follow up on obesity with weight gain post bariatric surgery,  Prediabetes after initially curing type 2 DM with weight loss.    Last seen August   She had a 20 wt loss from Nov 2019  To August 2020 but has gained 7 lbs since August .  Not walking due to cold weather   Feels her mood becomes a little down in the winter months,  Doesn't ike the cold.  Discussed increasing the wellbutrin   Outpatient Medications Prior to Visit  Medication Sig Dispense Refill  . amitriptyline (ELAVIL) 50 MG tablet TAKE 1 TABLET BY MOUTH EVERYDAY AT BEDTIME 90 tablet 1  . clonazePAM (KLONOPIN) 0.5 MG tablet TAKE 1/2 TABLET BY MOUTH TWICE DAILY AS NEEDED 30 tablet 5  . fluticasone (FLONASE) 50 MCG/ACT nasal spray SPRAY 2 SPRAYS INTO EACH NOSTRIL EVERY DAY 16 mL 2  . hydrochlorothiazide (HYDRODIURIL) 25 MG tablet TAKE 1 TABLET BY MOUTH EVERY DAY 90 tablet 1  . omeprazole (PRILOSEC) 20 MG capsule Take 1 capsule (20 mg total) by mouth 2 (two) times daily before a meal. 180 capsule 1  . sertraline (ZOLOFT) 100 MG tablet TAKE 1 TABLET BY MOUTH EVERY DAY 90 tablet 1  . traMADol (ULTRAM) 50 MG tablet TAKE 2 TABLETS (100 MG TOTAL) BY MOUTH EVERY 8 (EIGHT) HOURS AS NEEDED. FOR PAIN 180 tablet 2  . traZODone (DESYREL) 100 MG tablet TAKE 1 TABLET BY MOUTH TWICE A DAY 180 tablet 1  . buPROPion (WELLBUTRIN SR)  100 MG 12 hr tablet TAKE 1 TABLET BY MOUTH TWICE A DAY 180 tablet 1  . HYDROcodone-acetaminophen (NORCO) 10-325 MG tablet TAKE 1 TABLET BY MOUTH ONCE DAILY AS NEEDED FOR PAIN, MAXIMUM 1 DAILY 30 tablet 0  . HYDROcodone-acetaminophen (NORCO) 10-325 MG tablet Take 1 tablet by mouth daily as needed. 30 tablet 0  . HYDROcodone-acetaminophen (NORCO) 10-325 MG tablet Take 1 tablet by mouth daily as needed. 30 tablet 0   No facility-administered medications prior to visit.     Review of Systems;  Patient denies headache, fevers, malaise, unintentional weight loss, skin rash, eye pain, sinus congestion and sinus pain, sore throat, dysphagia,  hemoptysis , cough, dyspnea, wheezing, chest pain, palpitations, orthopnea, edema, abdominal pain, nausea, melena, diarrhea, constipation, flank pain, dysuria, hematuria, urinary  Frequency, nocturia, numbness, tingling, seizures,  Focal weakness, Loss of consciousness,  Tremor, insomnia, depression, anxiety, and suicidal ideation.      Objective:  BP 122/68 (BP Location: Left Arm, Patient Position: Sitting, Cuff Size: Normal)   Pulse 82   Temp (!) 97.5 F (36.4 C) (Temporal)   Resp 15   Ht 5\' 5"  (1.651 m)   Wt 189 lb 3.2 oz (85.8 kg)   SpO2 96%   BMI 31.48 kg/m   BP Readings from Last 3 Encounters:  11/11/18 122/68  08/27/18 120/77  11/19/17 134/82    Wt Readings from Last 3 Encounters:  11/11/18 189  lb 3.2 oz (85.8 kg)  08/27/18 182 lb (82.6 kg)  11/19/17 201 lb 8 oz (91.4 kg)    General appearance: alert, cooperative and appears stated age Ears: normal TM's and external ear canals both ears Throat: lips, mucosa, and tongue normal; teeth and gums normal Neck: no adenopathy, no carotid bruit, supple, symmetrical, trachea midline and thyroid not enlarged, symmetric, no tenderness/mass/nodules Back: symmetric, no curvature. ROM normal. No CVA tenderness. Lungs: clear to auscultation bilaterally Heart: regular rate and rhythm, S1, S2 normal, no  murmur, click, rub or gallop Abdomen: soft, non-tender; bowel sounds normal; no masses,  no organomegaly Pulses: 2+ and symmetric Skin: Skin color, texture, turgor normal. No rashes or lesions Lymph nodes: Cervical, supraclavicular, and axillary nodes normal.  Lab Results  Component Value Date   HGBA1C 5.6 11/11/2018   HGBA1C 5.7 06/02/2018   HGBA1C 5.5 11/19/2017    Lab Results  Component Value Date   CREATININE 1.10 11/11/2018   CREATININE 1.27 (H) 06/10/2018   CREATININE 1.34 (H) 06/02/2018    Lab Results  Component Value Date   WBC 6.3 06/02/2018   HGB 12.9 06/02/2018   HCT 38.6 06/02/2018   PLT 243.0 06/02/2018   GLUCOSE 104 (H) 11/11/2018   CHOL 191 11/11/2018   TRIG 178.0 (H) 11/11/2018   HDL 67.20 11/11/2018   LDLDIRECT 105.0 05/09/2016   LDLCALC 88 11/11/2018   ALT 15 11/11/2018   AST 18 11/11/2018   NA 141 11/11/2018   K 4.2 11/11/2018   CL 103 11/11/2018   CREATININE 1.10 11/11/2018   BUN 21 11/11/2018   CO2 33 (H) 11/11/2018   TSH 1.88 11/11/2018   HGBA1C 5.6 11/11/2018   MICROALBUR <0.7 11/11/2018    No results found.  Assessment & Plan:   Problem List Items Addressed This Visit      Unprioritized   Personal history of diabetes mellitus    Improving  a1c with weight loss    I recommend she follow a low glycemic index diet and particpate regularly in an aerobic  exercise activity.     Lab Results  Component Value Date   HGBA1C 5.6 11/11/2018         Obesity    I have congratulated her in reduction of   BMI and encouraged  Continued weight loss with goal of 10% of body weigh over the next 6 months using a low glycemic index diet and regular exercise a minimum of 5 days per week.        DJD (degenerative joint disease) of knee    Continue once daily vicodin for pain control .  Max 1 daily Refill history confirmed via [place] Controlled Substance database, accessed by me today..      Relevant Medications   HYDROcodone-acetaminophen (NORCO)  10-325 MG tablet (Start on 01/25/2019)   HYDROcodone-acetaminophen (NORCO) 10-325 MG tablet (Start on 12/26/2018)   HYDROcodone-acetaminophen (NORCO) 10-325 MG tablet (Start on 11/26/2018)    Other Visit Diagnoses    Irregular heart beat    -  Primary   Relevant Orders   TSH (Completed)   Encounter for breast cancer screening other than mammogram       Relevant Orders   MM 3D SCREEN BREAST BILATERAL   Dizziness       Impaired fasting glucose       Need for immunization against influenza       Relevant Orders   Flu Vaccine QUAD High Dose(Fluad) (Completed)      I have  changed Lyna B. Siebenaler's buPROPion. I am also having her maintain her omeprazole, clonazePAM, sertraline, traZODone, fluticasone, traMADol, hydrochlorothiazide, amitriptyline, HYDROcodone-acetaminophen, HYDROcodone-acetaminophen, and HYDROcodone-acetaminophen.  Meds ordered this encounter  Medications  . buPROPion (WELLBUTRIN SR) 150 MG 12 hr tablet    Sig: Take 1 tablet (150 mg total) by mouth 2 (two) times daily.    Dispense:  60 tablet    Refill:  1  . HYDROcodone-acetaminophen (NORCO) 10-325 MG tablet    Sig: Take 1 tablet by mouth daily as needed.    Dispense:  30 tablet    Refill:  0    May refill on or after Oct 11 2018  . HYDROcodone-acetaminophen (NORCO) 10-325 MG tablet    Sig: Take 1 tablet by mouth daily as needed.    Dispense:  30 tablet    Refill:  0    May refill on or after Nov 10 2018  . HYDROcodone-acetaminophen (NORCO) 10-325 MG tablet    Sig: TAKE 1 TABLET BY MOUTH ONCE DAILY AS NEEDED FOR PAIN, MAXIMUM 1 DAILY    Dispense:  30 tablet    Refill:  0    Medications Discontinued During This Encounter  Medication Reason  . buPROPion (WELLBUTRIN SR) 100 MG 12 hr tablet   . HYDROcodone-acetaminophen (NORCO) 10-325 MG tablet Reorder  . HYDROcodone-acetaminophen (NORCO) 10-325 MG tablet Reorder  . HYDROcodone-acetaminophen (NORCO) 10-325 MG tablet Reorder    Follow-up: No follow-ups on file.    [name], MD

## 2018-11-16 ENCOUNTER — Other Ambulatory Visit: Payer: Self-pay | Admitting: Internal Medicine

## 2018-11-28 ENCOUNTER — Other Ambulatory Visit: Payer: Self-pay | Admitting: Internal Medicine

## 2018-12-12 ENCOUNTER — Other Ambulatory Visit: Payer: Self-pay | Admitting: Internal Medicine

## 2018-12-25 ENCOUNTER — Other Ambulatory Visit: Payer: Self-pay | Admitting: Internal Medicine

## 2018-12-28 ENCOUNTER — Other Ambulatory Visit: Payer: Self-pay | Admitting: Internal Medicine

## 2019-03-03 ENCOUNTER — Other Ambulatory Visit: Payer: Self-pay | Admitting: Internal Medicine

## 2019-03-03 NOTE — Telephone Encounter (Signed)
Refill request for tramadol, last seen 11-11-18, last filled 10-15-18.  Please advise.

## 2019-03-09 ENCOUNTER — Emergency Department
Admission: EM | Admit: 2019-03-09 | Discharge: 2019-03-09 | Disposition: A | Discharge status: Home / Self Care | Payer: Medicare Other | Attending: Student | Admitting: Student

## 2019-03-09 ENCOUNTER — Encounter: Payer: Self-pay | Admitting: Emergency Medicine

## 2019-03-09 ENCOUNTER — Other Ambulatory Visit: Payer: Self-pay

## 2019-03-09 ENCOUNTER — Emergency Department: Payer: Medicare Other

## 2019-03-09 DIAGNOSIS — Y92481 Parking lot as the place of occurrence of the external cause: Secondary | ICD-10-CM | POA: Diagnosis not present

## 2019-03-09 DIAGNOSIS — R5381 Other malaise: Secondary | ICD-10-CM | POA: Diagnosis not present

## 2019-03-09 DIAGNOSIS — S01111A Laceration without foreign body of right eyelid and periocular area, initial encounter: Secondary | ICD-10-CM | POA: Insufficient documentation

## 2019-03-09 DIAGNOSIS — W19XXXA Unspecified fall, initial encounter: Secondary | ICD-10-CM

## 2019-03-09 DIAGNOSIS — E119 Type 2 diabetes mellitus without complications: Secondary | ICD-10-CM | POA: Diagnosis not present

## 2019-03-09 DIAGNOSIS — Z743 Need for continuous supervision: Secondary | ICD-10-CM | POA: Diagnosis not present

## 2019-03-09 DIAGNOSIS — Z96642 Presence of left artificial hip joint: Secondary | ICD-10-CM | POA: Diagnosis not present

## 2019-03-09 DIAGNOSIS — W101XXA Fall (on)(from) sidewalk curb, initial encounter: Secondary | ICD-10-CM | POA: Diagnosis not present

## 2019-03-09 DIAGNOSIS — S0083XA Contusion of other part of head, initial encounter: Secondary | ICD-10-CM

## 2019-03-09 DIAGNOSIS — S0990XA Unspecified injury of head, initial encounter: Secondary | ICD-10-CM | POA: Diagnosis not present

## 2019-03-09 DIAGNOSIS — S0993XA Unspecified injury of face, initial encounter: Secondary | ICD-10-CM | POA: Diagnosis not present

## 2019-03-09 DIAGNOSIS — S80211A Abrasion, right knee, initial encounter: Secondary | ICD-10-CM | POA: Diagnosis not present

## 2019-03-09 DIAGNOSIS — Y999 Unspecified external cause status: Secondary | ICD-10-CM | POA: Insufficient documentation

## 2019-03-09 DIAGNOSIS — Z9884 Bariatric surgery status: Secondary | ICD-10-CM | POA: Insufficient documentation

## 2019-03-09 DIAGNOSIS — Z9049 Acquired absence of other specified parts of digestive tract: Secondary | ICD-10-CM | POA: Diagnosis not present

## 2019-03-09 DIAGNOSIS — I10 Essential (primary) hypertension: Secondary | ICD-10-CM | POA: Diagnosis not present

## 2019-03-09 DIAGNOSIS — R52 Pain, unspecified: Secondary | ICD-10-CM | POA: Diagnosis not present

## 2019-03-09 DIAGNOSIS — Z79899 Other long term (current) drug therapy: Secondary | ICD-10-CM | POA: Insufficient documentation

## 2019-03-09 DIAGNOSIS — Y939 Activity, unspecified: Secondary | ICD-10-CM | POA: Diagnosis not present

## 2019-03-09 DIAGNOSIS — S0181XA Laceration without foreign body of other part of head, initial encounter: Secondary | ICD-10-CM

## 2019-03-09 DIAGNOSIS — Z96651 Presence of right artificial knee joint: Secondary | ICD-10-CM | POA: Insufficient documentation

## 2019-03-09 MED ORDER — NAPROXEN 500 MG PO TABS
500.0000 mg | ORAL_TABLET | Freq: Once | ORAL | Status: AC
  Administered 2019-03-09: 500 mg via ORAL
  Filled 2019-03-09: qty 1

## 2019-03-09 MED ORDER — LIDOCAINE-EPINEPHRINE-TETRACAINE (LET) TOPICAL GEL: 3.0000 mL | Freq: Once | TOPICAL | Status: DC

## 2019-03-09 NOTE — Discharge Instructions (Addendum)
Your exam and CT scans are negative for any signs of fracture to the facial bones or any brain bleeding. Your superficial facial laceration has been repaired using wound glue. Keep the area free of lotions, creams, or ointments. Take OTC Tylenol or Advil as needed. You may develop a "black eye" from your injury.

## 2019-03-09 NOTE — ED Provider Notes (Signed)
[facility] Emergency Department Provider Note ____________________________________________  Time seen: 1245  I have reviewed the triage vital signs and the nursing notes.  HISTORY  Chief Complaint  Fall  HPI Jodi [name] is a 74 y.o. female presents himself to the ED via EMS from a local store.  Patient describes driving to the store, and when she got out of her car she apparently stepped up on a curb but missed her footing, and landed, hitting her face on the right side.  She presents with a small laceration of the right brow and swelling to the right cheek.  He denies any loss of consciousness, nausea, vomiting, dizziness but she also denies any preceding weakness or near syncope.  She also reports an abrasion to the right knee.  She denies any other injury at this time.   Past Medical History:  Diagnosis Date  . Anxiety   . Arthritis   . Diabetes mellitus without complication ([place])    diet controlled since gastric bypass  . Hypertension   . Sleep apnea     Patient Active Problem List   Diagnosis Date Noted  . Gastritis 05/31/2018  . Frequent falls 05/31/2018  . Arrhythmia 05/09/2016  . Degenerative arthritis of carpometacarpal joint of thumb 07/28/2015  . Encounter for preventive health examination 04/29/2015  . Grief reaction 02/12/2015  . DJD (degenerative joint disease) of knee 07/03/2014  . Obesity 01/22/2014  . OSA (obstructive sleep apnea) 01/19/2014  . Anxiety   . History of gastric bypass 10/30/2011  . Personal history of diabetes mellitus 10/30/2011    Past Surgical History:  Procedure Laterality Date  . BLADDER SUSPENSION    . CHOLECYSTECTOMY    . COLONOSCOPY WITH PROPOFOL N/A 07/16/2016   Procedure: COLONOSCOPY WITH PROPOFOL;  Surgeon: Jonathon Bellows, MD;  Location: [facility] ENDOSCOPY;  Service: Endoscopy;  Laterality: N/A;  . JOINT REPLACEMENT     left hip, right knee  . ROUX-EN-Y PROCEDURE  2010  . tracheotomy     at age 67 due to  respiratroy failure    Prior to Admission medications   Medication Sig Start Date End Date Taking? Authorizing Provider  amitriptyline (ELAVIL) 50 MG tablet TAKE 1 TABLET BY MOUTH EVERYDAY AT BEDTIME 11/17/18   [name], MD  buPROPion [facility] SR) 100 MG 12 hr tablet TAKE 1 TABLET BY MOUTH TWICE A DAY 12/29/18   [name], MD  buPROPion [facility] SR) 150 MG 12 hr tablet TAKE 1 TABLET BY MOUTH TWICE A DAY 12/14/18   [name], MD  clonazePAM (KLONOPIN) 0.5 MG tablet TAKE 1/2 TABLET BY MOUTH TWICE DAILY AS NEEDED 11/30/18   [name], MD  fluticasone (FLONASE) 50 MCG/ACT nasal spray SPRAY 2 SPRAYS INTO EACH NOSTRIL EVERY DAY 12/25/18   [name], MD  hydrochlorothiazide (HYDRODIURIL) 25 MG tablet TAKE 1 TABLET BY MOUTH EVERY DAY 10/23/18   [name], MD  HYDROcodone-acetaminophen (NORCO) 10-325 MG tablet Take 1 tablet by mouth daily as needed. 01/25/19   [name], MD  HYDROcodone-acetaminophen (NORCO) 10-325 MG tablet Take 1 tablet by mouth daily as needed. 12/26/18   [name], MD  HYDROcodone-acetaminophen (NORCO) 10-325 MG tablet TAKE 1 TABLET BY MOUTH ONCE DAILY AS NEEDED FOR PAIN, MAXIMUM 1 DAILY 11/26/18   [name], MD  omeprazole (PRILOSEC) 20 MG capsule TAKE 1 CAPSULE (20 MG TOTAL) BY MOUTH 2 (TWO) TIMES DAILY BEFORE A MEAL. 12/29/18   [name],  MD  sertraline (ZOLOFT) 100 MG tablet TAKE 1 TABLET BY MOUTH EVERY DAY 12/25/18   [name], MD  traMADol (ULTRAM) 50 MG tablet TAKE 2 TABLETS (100 MG TOTAL) BY MOUTH EVERY 8 (EIGHT) HOURS AS NEEDED. FOR PAIN 03/03/19   [name], MD  traZODone (DESYREL) 100 MG tablet TAKE 1 TABLET BY MOUTH TWICE A DAY 12/29/18   [name], MD    Allergies Morphine and related  Family History  Problem Relation Age of Onset  . COPD Mother   . Hypertension Mother   . Ovarian cancer Mother 67       ovarian CA  . Breast cancer Daughter   . Breast cancer Paternal Aunt   .  Breast cancer Paternal Grandmother   . Cancer Sister     Social History Social History   Tobacco Use  . Smoking status: Never Smoker  . Smokeless tobacco: Never Used  Substance Use Topics  . Alcohol use: No  . Drug use: No    Review of Systems  Constitutional: Negative for fever. Eyes: Negative for visual changes. ENT: Negative for sore throat. Cardiovascular: Negative for chest pain. Respiratory: Negative for shortness of breath. Gastrointestinal: Negative for abdominal pain, vomiting and diarrhea. Genitourinary: Negative for dysuria. Musculoskeletal: Negative for back pain. Skin: Negative for rash. Neurological: Negative for headaches, focal weakness or numbness. ____________________________________________  PHYSICAL EXAM:  VITAL SIGNS: ED Triage Vitals  Enc Vitals Group     BP 03/09/19 1234 (!) 170/88     Pulse Rate 03/09/19 1234 88     Resp 03/09/19 1234 18     Temp 03/09/19 1234 98 F (36.7 C)     Temp Source 03/09/19 1234 Oral     SpO2 03/09/19 1234 99 %     Weight 03/09/19 1235 187 lb (84.8 kg)     Height 03/09/19 1235 5\' 4"  (1.626 m)     Head Circumference --      Peak Flow --      Pain Score 03/09/19 1235 10     Pain Loc --      Pain Edu? --      Excl. in [place]? --     Constitutional: Alert and oriented. Well appearing and in no distress. GCS=15 Head: Normocephalic and atraumatic, except for early STS and ecchymosis to the zygomatic cheek on the right. Eyes: Conjunctivae are normal. PERRL. Normal extraocular movements and fundi bilaterally. 1 cm linear laceration to the right brow. No active bleeding noted.  Ears: Canals clear. TMs intact bilaterally. Nose: No congestion/rhinorrhea/epistaxis. Mouth/Throat: Mucous membranes are moist. Neck: Supple. Normal ROM without crepitus. No distracting midline tenderness noted.  Cardiovascular: Normal rate, regular rhythm. Normal distal pulses. Respiratory: Normal respiratory effort. No  wheezes/rales/rhonchi. Gastrointestinal: Soft and nontender. No distention. Musculoskeletal: Nontender with normal range of motion in all extremities. Right knee with superficial abrasion noted.  Neurologic:  Normal gross sensation. Normal speech and language. No gross focal neurologic deficits are appreciated. Skin:  Skin is warm, dry and intact. No rash noted. ____________________________________________   RADIOLOGY  CT Head w/o CM IMPRESSION: No evidence of acute intracranial injury.  CT Maxillofacial w/o CM IMPRESSION: No acute facial fracture ____________________________________________  PROCEDURES  Naproxen 500 mg PO  .Marland KitchenLaceration Repair  Date/Time: 03/09/2019 12:58 PM Performed by: Melvenia Needles, PA-C Authorized by: Melvenia Needles, PA-C   Consent:    Consent obtained:  Verbal   Consent given by:  Patient   Risks discussed:  Pain and poor cosmetic result   Alternatives discussed:  No treatment Anesthesia (see MAR for exact dosages):    Anesthesia method:  None Laceration details:    Location:  Face   Face location:  R eyebrow   Length (cm):  1   Depth (mm):  2 Repair type:    Repair type:  Simple Treatment:    Area cleansed with:  Soap and water   Amount of cleaning:  Standard Skin repair:    Repair method:  Tissue adhesive Approximation:    Approximation:  Close Post-procedure details:    Dressing:  Open (no dressing)   Patient tolerance of procedure:  Tolerated well, no immediate complications   ____________________________________________  INITIAL IMPRESSION / ASSESSMENT AND PLAN / ED COURSE  Patient with ED evaluation of injuries following a mechanical fall. She sustained a facial contusion along with a superficial right brow laceration. Her exam is stable and her CTs are negative for any acute findings. She is treated with a Naproxen for her pain. She has her laceration repaired with Dermabond. She will follow-up with her provider  for continued symptoms. Return precautions have been reviewed.   Jodi [name] was evaluated in Emergency Department on 03/09/2019 for the symptoms described in the history of present illness. She was evaluated in the context of the global COVID-19 pandemic, which necessitated consideration that the patient might be at risk for infection with the SARS-CoV-2 virus that causes COVID-19. Institutional protocols and algorithms that pertain to the evaluation of patients at risk for COVID-19 are in a state of rapid change based on information released by regulatory bodies including the CDC and federal and state organizations. These policies and algorithms were followed during the patient's care in the ED. ____________________________________________  FINAL CLINICAL IMPRESSION(S) / ED DIAGNOSES  Final diagnoses:  Fall, initial encounter  Contusion of face, initial encounter  Facial laceration, initial encounter      Melvenia Needles, PA-C 03/09/19 1404    Lilia Pro., MD 03/10/19 1001

## 2019-03-09 NOTE — ED Triage Notes (Signed)
Presents vis EMS s/p fall  States she tripped on curb  Abrasion noted to right knee  Small laceration noted over right eye  No LOC

## 2019-03-25 ENCOUNTER — Other Ambulatory Visit: Payer: Self-pay | Admitting: Internal Medicine

## 2019-03-25 MED ORDER — CLONAZEPAM 0.5 MG PO TABS: ORAL_TABLET | ORAL | 0 refills | Status: DC

## 2019-03-25 NOTE — Telephone Encounter (Signed)
Patient is out of the following medication; clonazePAM (KLONOPIN) 0.5 MG tablet and HYDROcodone-acetaminophen (NORCO) 10-325 MG tablet

## 2019-03-25 NOTE — Telephone Encounter (Signed)
The clonazepam has been refilled for 30 days to avoid withdrawal  .  The vicodin has been denied because she has not been since November

## 2019-03-25 NOTE — Telephone Encounter (Signed)
Clonazepam Refilled: 11/30/2018  Hydrocodone Refilled: 01/25/2019  Last OV: 11/11/2018 Next OV: 05/31/2019

## 2019-03-26 NOTE — Telephone Encounter (Signed)
LMTCB

## 2019-05-02 ENCOUNTER — Other Ambulatory Visit: Payer: Self-pay | Admitting: Internal Medicine

## 2019-05-03 ENCOUNTER — Other Ambulatory Visit: Payer: Self-pay | Admitting: Internal Medicine

## 2019-05-03 DIAGNOSIS — M25572 Pain in left ankle and joints of left foot: Secondary | ICD-10-CM | POA: Diagnosis not present

## 2019-05-03 DIAGNOSIS — W19XXXA Unspecified fall, initial encounter: Secondary | ICD-10-CM | POA: Diagnosis not present

## 2019-05-03 DIAGNOSIS — M1990 Unspecified osteoarthritis, unspecified site: Secondary | ICD-10-CM | POA: Diagnosis not present

## 2019-05-03 DIAGNOSIS — Z96651 Presence of right artificial knee joint: Secondary | ICD-10-CM | POA: Diagnosis not present

## 2019-05-03 DIAGNOSIS — N189 Chronic kidney disease, unspecified: Secondary | ICD-10-CM | POA: Diagnosis not present

## 2019-05-03 DIAGNOSIS — R52 Pain, unspecified: Secondary | ICD-10-CM | POA: Diagnosis not present

## 2019-05-03 DIAGNOSIS — S9305XA Dislocation of left ankle joint, initial encounter: Secondary | ICD-10-CM | POA: Diagnosis not present

## 2019-05-03 DIAGNOSIS — S8992XA Unspecified injury of left lower leg, initial encounter: Secondary | ICD-10-CM | POA: Diagnosis not present

## 2019-05-03 DIAGNOSIS — S8991XA Unspecified injury of right lower leg, initial encounter: Secondary | ICD-10-CM | POA: Diagnosis not present

## 2019-05-03 DIAGNOSIS — I2510 Atherosclerotic heart disease of native coronary artery without angina pectoris: Secondary | ICD-10-CM | POA: Diagnosis not present

## 2019-05-03 DIAGNOSIS — Z743 Need for continuous supervision: Secondary | ICD-10-CM | POA: Diagnosis not present

## 2019-05-03 DIAGNOSIS — I129 Hypertensive chronic kidney disease with stage 1 through stage 4 chronic kidney disease, or unspecified chronic kidney disease: Secondary | ICD-10-CM | POA: Diagnosis not present

## 2019-05-03 DIAGNOSIS — E7800 Pure hypercholesterolemia, unspecified: Secondary | ICD-10-CM | POA: Diagnosis not present

## 2019-05-03 DIAGNOSIS — E1122 Type 2 diabetes mellitus with diabetic chronic kidney disease: Secondary | ICD-10-CM | POA: Diagnosis not present

## 2019-05-03 DIAGNOSIS — Z20822 Contact with and (suspected) exposure to covid-19: Secondary | ICD-10-CM | POA: Diagnosis not present

## 2019-05-03 DIAGNOSIS — S82852A Displaced trimalleolar fracture of left lower leg, initial encounter for closed fracture: Secondary | ICD-10-CM | POA: Diagnosis not present

## 2019-05-03 DIAGNOSIS — Z79899 Other long term (current) drug therapy: Secondary | ICD-10-CM | POA: Diagnosis not present

## 2019-05-03 NOTE — Telephone Encounter (Signed)
Refill request for klonopin, last seen 11-11-2018, last filled  .  Please advise.

## 2019-05-04 NOTE — Telephone Encounter (Signed)
Patient is due for 6 month follow up in may with labs and OV please schedule  Clonazepam last refill sent,  No more without appt

## 2019-05-11 DIAGNOSIS — Z01812 Encounter for preprocedural laboratory examination: Secondary | ICD-10-CM | POA: Diagnosis not present

## 2019-05-11 DIAGNOSIS — Z20822 Contact with and (suspected) exposure to covid-19: Secondary | ICD-10-CM | POA: Diagnosis not present

## 2019-05-12 DIAGNOSIS — E1122 Type 2 diabetes mellitus with diabetic chronic kidney disease: Secondary | ICD-10-CM | POA: Diagnosis not present

## 2019-05-12 DIAGNOSIS — Z79899 Other long term (current) drug therapy: Secondary | ICD-10-CM | POA: Diagnosis not present

## 2019-05-12 DIAGNOSIS — Z20822 Contact with and (suspected) exposure to covid-19: Secondary | ICD-10-CM | POA: Diagnosis not present

## 2019-05-12 DIAGNOSIS — E7800 Pure hypercholesterolemia, unspecified: Secondary | ICD-10-CM | POA: Diagnosis not present

## 2019-05-12 DIAGNOSIS — E785 Hyperlipidemia, unspecified: Secondary | ICD-10-CM | POA: Diagnosis not present

## 2019-05-12 DIAGNOSIS — N189 Chronic kidney disease, unspecified: Secondary | ICD-10-CM | POA: Diagnosis not present

## 2019-05-12 DIAGNOSIS — M1990 Unspecified osteoarthritis, unspecified site: Secondary | ICD-10-CM | POA: Diagnosis not present

## 2019-05-12 DIAGNOSIS — I129 Hypertensive chronic kidney disease with stage 1 through stage 4 chronic kidney disease, or unspecified chronic kidney disease: Secondary | ICD-10-CM | POA: Diagnosis not present

## 2019-05-12 DIAGNOSIS — Z9884 Bariatric surgery status: Secondary | ICD-10-CM | POA: Diagnosis not present

## 2019-05-12 DIAGNOSIS — Z885 Allergy status to narcotic agent status: Secondary | ICD-10-CM | POA: Diagnosis not present

## 2019-05-12 DIAGNOSIS — S82892A Other fracture of left lower leg, initial encounter for closed fracture: Secondary | ICD-10-CM | POA: Diagnosis not present

## 2019-05-12 DIAGNOSIS — G8918 Other acute postprocedural pain: Secondary | ICD-10-CM | POA: Diagnosis not present

## 2019-05-18 ENCOUNTER — Other Ambulatory Visit: Payer: Self-pay | Admitting: Internal Medicine

## 2019-05-24 DIAGNOSIS — S82892D Other fracture of left lower leg, subsequent encounter for closed fracture with routine healing: Secondary | ICD-10-CM | POA: Diagnosis not present

## 2019-05-31 ENCOUNTER — Ambulatory Visit (INDEPENDENT_AMBULATORY_CARE_PROVIDER_SITE_OTHER): Payer: Medicare Other | Admitting: Internal Medicine

## 2019-05-31 ENCOUNTER — Ambulatory Visit: Payer: Medicare Other

## 2019-05-31 ENCOUNTER — Other Ambulatory Visit: Payer: Self-pay

## 2019-05-31 ENCOUNTER — Encounter: Payer: Self-pay | Admitting: Internal Medicine

## 2019-05-31 VITALS — BP 120/74 | HR 85 | Temp 96.9°F | Resp 15 | Ht 64.0 in | Wt 194.4 lb

## 2019-05-31 DIAGNOSIS — S82852A Displaced trimalleolar fracture of left lower leg, initial encounter for closed fracture: Secondary | ICD-10-CM | POA: Diagnosis not present

## 2019-05-31 DIAGNOSIS — E559 Vitamin D deficiency, unspecified: Secondary | ICD-10-CM

## 2019-05-31 DIAGNOSIS — Z6833 Body mass index (BMI) 33.0-33.9, adult: Secondary | ICD-10-CM

## 2019-05-31 DIAGNOSIS — Z1231 Encounter for screening mammogram for malignant neoplasm of breast: Secondary | ICD-10-CM

## 2019-05-31 DIAGNOSIS — F419 Anxiety disorder, unspecified: Secondary | ICD-10-CM

## 2019-05-31 DIAGNOSIS — S82852S Displaced trimalleolar fracture of left lower leg, sequela: Secondary | ICD-10-CM

## 2019-05-31 DIAGNOSIS — E6609 Other obesity due to excess calories: Secondary | ICD-10-CM

## 2019-05-31 DIAGNOSIS — E785 Hyperlipidemia, unspecified: Secondary | ICD-10-CM

## 2019-05-31 HISTORY — DX: Displaced trimalleolar fracture of left lower leg, sequela: S82.852S

## 2019-05-31 LAB — HEPATIC FUNCTION PANEL
ALT: 15 U/L (ref 0–35)
AST: 15 U/L (ref 0–37)
Albumin: 4.1 g/dL (ref 3.5–5.2)
Alkaline Phosphatase: 133 U/L — ABNORMAL HIGH (ref 39–117)
Bilirubin, Direct: 0.1 mg/dL (ref 0.0–0.3)
Total Bilirubin: 0.5 mg/dL (ref 0.2–1.2)
Total Protein: 6 g/dL (ref 6.0–8.3)

## 2019-05-31 LAB — VITAMIN D 25 HYDROXY (VIT D DEFICIENCY, FRACTURES): VITD: 57.24 ng/mL (ref 30.00–100.00)

## 2019-05-31 LAB — LIPID PANEL
Cholesterol: 172 mg/dL (ref 0–200)
HDL: 75.1 mg/dL (ref >39.00)
LDL Cholesterol: 79 mg/dL (ref 0–99)
NonHDL: 96.47
Total CHOL/HDL Ratio: 2
Triglycerides: 89 mg/dL (ref 0.0–149.0)
VLDL: 17.8 mg/dL (ref 0.0–40.0)

## 2019-05-31 MED ORDER — SERTRALINE HCL 100 MG PO TABS: 100.0000 mg | ORAL_TABLET | Freq: Every day | ORAL | 1 refills | Status: DC

## 2019-05-31 MED ORDER — HYDROCODONE-ACETAMINOPHEN 10-325 MG PO TABS: 1.0000 | ORAL_TABLET | Freq: Four times a day (QID) | ORAL | 0 refills | Status: DC | PRN

## 2019-05-31 MED ORDER — CLONAZEPAM 0.5 MG PO TABS: ORAL_TABLET | ORAL | 2 refills | Status: DC

## 2019-05-31 NOTE — Progress Notes (Signed)
Subjective:  Patient ID: Jodi [name], female    DOB: Feb 23, 1945  Age: 74 y.o. MRN: [id number]:[id number]  CC: The primary encounter diagnosis was Vitamin D deficiency. Diagnoses of Hyperlipidemia LDL goal <130, Class 1 obesity due to excess calories without serious comorbidity with body mass index (BMI) of 33.0 to 33.9 in adult, Breast cancer screening by mammogram, Trimalleolar fracture of ankle, closed, left, initial encounter, and Anxiety were also pertinent to this visit.  HPI Jodi [name] presents for medication refill and follow up on chronic conditions.  Last OV Nov 4   This visit occurred during the SARS-CoV-2 public health emergency.  Safety protocols were in place, including screening questions prior to the visit, additional usage of staff PPE, and extensive cleaning of exam room while observing appropriate contact time as indicated for disinfecting solutions.    Patient has received NO  doses of the available COVID 19 vaccines.   Patient continues to mask when outside of the home except when walking in yard or at safe distances from others .  Patient denies any change in mood or development of unhealthy behaviors resuting from the pandemic's restriction of activities and socialization.    S/p Left trimalleolar fracture with dislocation on April 26 after falling on outside steps.  S/p surgery  May 5,  Given exercises to do for ankle rehab in a recliner since she can't get down on the floor  Has been out of pain meds since last visit was November and  Last refill was in mid February.  Was given #42 oxycodone on May 5.  Still has a few tramadol.  Sleeping with foot on a pillow,  Not in the boot.  granddaughter 20 yrs old likes to sleep with her and has been careful not to kick her .  Depression with anxiety. Taking meds including wellbutrin and sertraline .    Vit D deficiency:  Has not started supplemements    Outpatient Medications Prior to Visit  Medication Sig Dispense Refill  .  amitriptyline (ELAVIL) 50 MG tablet TAKE 1 TABLET BY MOUTH EVERYDAY AT BEDTIME 90 tablet 1  . ascorbic acid (VITAMIN C) 500 MG tablet Take by mouth.    Marland Kitchen aspirin 81 MG EC tablet Take by mouth.    Marland Kitchen buPROPion (WELLBUTRIN SR) 100 MG 12 hr tablet TAKE 1 TABLET BY MOUTH TWICE A DAY 180 tablet 1  . buPROPion (WELLBUTRIN SR) 150 MG 12 hr tablet TAKE 1 TABLET BY MOUTH TWICE A DAY 180 tablet 1  . Docusate Sodium (DSS) 100 MG CAPS Take by mouth.    . fluticasone (FLONASE) 50 MCG/ACT nasal spray SPRAY 2 SPRAYS INTO EACH NOSTRIL EVERY DAY 48 mL 0  . hydrochlorothiazide (HYDRODIURIL) 25 MG tablet TAKE 1 TABLET BY MOUTH EVERY DAY 90 tablet 1  . omeprazole (PRILOSEC) 20 MG capsule TAKE 1 CAPSULE (20 MG TOTAL) BY MOUTH 2 (TWO) TIMES DAILY BEFORE A MEAL. 180 capsule 1  . traMADol (ULTRAM) 50 MG tablet TAKE 2 TABLETS (100 MG TOTAL) BY MOUTH EVERY 8 (EIGHT) HOURS AS NEEDED. FOR PAIN 180 tablet 2  . traZODone (DESYREL) 100 MG tablet TAKE 1 TABLET BY MOUTH TWICE A DAY 180 tablet 1  . Vitamin D, Ergocalciferol, (DRISDOL) 1.25 MG (50000 UNIT) CAPS capsule Take 50,000 Units by mouth once a week.    . clonazePAM (KLONOPIN) 0.5 MG tablet TAKE 1/2 TABLET BY MOUTH TWICE DAILY AS NEEDED 30 tablet 0  . HYDROcodone-acetaminophen (NORCO) 10-325 MG tablet Take 1 tablet  by mouth daily as needed. 30 tablet 0  . HYDROcodone-acetaminophen (NORCO) 10-325 MG tablet Take 1 tablet by mouth daily as needed. 30 tablet 0  . HYDROcodone-acetaminophen (NORCO) 10-325 MG tablet TAKE 1 TABLET BY MOUTH ONCE DAILY AS NEEDED FOR PAIN, MAXIMUM 1 DAILY 30 tablet 0  . sertraline (ZOLOFT) 100 MG tablet TAKE 1 TABLET BY MOUTH EVERY DAY 90 tablet 1   No facility-administered medications prior to visit.    Review of Systems;  Patient denies headache, fevers, malaise, unintentional weight loss, skin rash, eye pain, sinus congestion and sinus pain, sore throat, dysphagia,  hemoptysis , cough, dyspnea, wheezing, chest pain, palpitations, orthopnea,  edema, abdominal pain, nausea, melena, diarrhea, constipation, flank pain, dysuria, hematuria, urinary  Frequency, nocturia, numbness, tingling, seizures,  Focal weakness, Loss of consciousness,  Tremor, insomnia, depression, anxiety, and suicidal ideation.      Objective:  BP 120/74 (BP Location: Left Arm, Patient Position: Sitting, Cuff Size: Normal)   Pulse 85   Temp (!) 96.9 F (36.1 C) (Temporal)   Resp 15   Ht 5\' 4"  (1.626 m)   Wt 194 lb 6.4 oz (88.2 kg)   SpO2 97%   BMI 33.37 kg/m   BP Readings from Last 3 Encounters:  05/31/19 120/74  03/09/19 (!) 170/88  11/11/18 122/68    Wt Readings from Last 3 Encounters:  05/31/19 194 lb 6.4 oz (88.2 kg)  03/09/19 187 lb (84.8 kg)  11/11/18 189 lb 3.2 oz (85.8 kg)    General appearance: alert, cooperative and appears stated age Ears: normal TM's and external ear canals both ears Throat: lips, mucosa, and tongue normal; teeth and gums normal Neck: no adenopathy, no carotid bruit, supple, symmetrical, trachea midline and thyroid not enlarged, symmetric, no tenderness/mass/nodules Back: symmetric, no curvature. ROM normal. No CVA tenderness. Lungs: clear to auscultation bilaterally Heart: regular rate and rhythm, S1, S2 normal, no murmur, click, rub or gallop Abdomen: soft, non-tender; bowel sounds normal; no masses,  no organomegaly Pulses: 2+ and symmetric Skin: Skin color, texture, turgor normal. No rashes or lesions Lymph nodes: Cervical, supraclavicular, and axillary nodes normal. Ext: left leg in AFO walker   Lab Results  Component Value Date   HGBA1C 5.6 11/11/2018   HGBA1C 5.7 06/02/2018   HGBA1C 5.5 11/19/2017    Lab Results  Component Value Date   CREATININE 1.10 11/11/2018   CREATININE 1.27 (H) 06/10/2018   CREATININE 1.34 (H) 06/02/2018    Lab Results  Component Value Date   WBC 6.3 06/02/2018   HGB 12.9 06/02/2018   HCT 38.6 06/02/2018   PLT 243.0 06/02/2018   GLUCOSE 104 (H) 11/11/2018   CHOL 172  05/31/2019   TRIG 89.0 05/31/2019   HDL 75.10 05/31/2019   LDLDIRECT 105.0 05/09/2016   LDLCALC 79 05/31/2019   ALT 15 05/31/2019   AST 15 05/31/2019   NA 141 11/11/2018   K 4.2 11/11/2018   CL 103 11/11/2018   CREATININE 1.10 11/11/2018   BUN 21 11/11/2018   CO2 33 (H) 11/11/2018   TSH 1.88 11/11/2018   HGBA1C 5.6 11/11/2018   MICROALBUR <0.7 11/11/2018    CT Head Wo Contrast  Result Date: 03/09/2019 CLINICAL DATA:  Fall EXAM: CT HEAD WITHOUT CONTRAST TECHNIQUE: Contiguous axial images were obtained from the base of the skull through the vertex without intravenous contrast. COMPARISON:  2008 FINDINGS: Brain: There is no acute intracranial hemorrhage, mass-effect, or edema. Gray-white differentiation is preserved. There is no extra-axial fluid collection. Ventricles and sulci  are within normal limits in size and configuration. Vascular: There is mild atherosclerotic calcification at the skull base. Skull: Calvarium is unremarkable. Sinuses/Orbits: No acute finding. Other: None. IMPRESSION: No evidence of acute intracranial injury. Electronically Signed   By: Macy Mis M.D.   On: 03/09/2019 13:29   CT Maxillofacial Wo Contrast  Result Date: 03/09/2019 CLINICAL DATA:  Fall EXAM: CT MAXILLOFACIAL WITHOUT CONTRAST TECHNIQUE: Multidetector CT imaging of the maxillofacial structures was performed. Multiplanar CT image reconstructions were also generated. COMPARISON:  None. FINDINGS: Osseous: No acute facial fracture. Degenerative changes at the right greater than left temporomandibular joints. Orbits: No intraorbital hematoma Sinuses: Aerated. Soft tissues: Right infraorbital soft tissue swelling. Limited intracranial: Dictated separately. IMPRESSION: No acute facial fracture. Electronically Signed   By: Macy Mis M.D.   On: 03/09/2019 13:32    Assessment & Plan:   Problem List Items Addressed This Visit      Unprioritized   Anxiety    Managed with  clonazepam dose to 0.25 mg once  daily given her concurrent use of  vicodin. Continue zoloft 100 mg daily       Relevant Medications   sertraline (ZOLOFT) 100 MG tablet   Obesity   Trimalleolar fracture of ankle, closed, left, initial encounter    S/p open reduction and fixation by orthopedics. She has been having a difficult time completing her home exercises due to pain .  Will refill Vicodin for 30 days        Other Visit Diagnoses    Vitamin D deficiency    -  Primary   Relevant Orders   VITAMIN D 25 Hydroxy (Vit-D Deficiency, Fractures) (Completed)   Hyperlipidemia LDL goal <130       Relevant Medications   aspirin 81 MG EC tablet   Other Relevant Orders   Lipid panel (Completed)   Hepatic function panel (Completed)   Breast cancer screening by mammogram       Relevant Orders   MM 3D SCREEN BREAST BILATERAL      I have discontinued Marylen B. Nocito's HYDROcodone-acetaminophen and HYDROcodone-acetaminophen. I have also changed her HYDROcodone-acetaminophen. Additionally, I am having her maintain her fluticasone, buPROPion, omeprazole, traZODone, traMADol, hydrochlorothiazide, amitriptyline, buPROPion, ascorbic acid, aspirin, DSS, Vitamin D (Ergocalciferol), clonazePAM, and sertraline.  Meds ordered this encounter  Medications  . DISCONTD: sertraline (ZOLOFT) 100 MG tablet    Sig: Take 1 tablet (100 mg total) by mouth daily.    Dispense:  90 tablet    Refill:  1  . HYDROcodone-acetaminophen (NORCO) 10-325 MG tablet    Sig: Take 1 tablet by mouth every 6 (six) hours as needed for moderate pain.    Dispense:  120 tablet    Refill:  0  . clonazePAM (KLONOPIN) 0.5 MG tablet    Sig: TAKE 1/2 TABLET BY MOUTH TWICE DAILY AS NEEDED    Dispense:  30 tablet    Refill:  2  . sertraline (ZOLOFT) 100 MG tablet    Sig: Take 1 tablet (100 mg total) by mouth daily.    Dispense:  90 tablet    Refill:  1    I provided  30 minutes of  face-to-face time during this encounter reviewing patient's current problems and  past surgeries, labs and imaging studies, providing counseling on the above mentioned problems , and coordination  of care .  Medications Discontinued During This Encounter  Medication Reason  . sertraline (ZOLOFT) 100 MG tablet Reorder  . HYDROcodone-acetaminophen (NORCO) 10-325 MG tablet   .  clonazePAM (KLONOPIN) 0.5 MG tablet Reorder  . sertraline (ZOLOFT) 100 MG tablet Reorder  . HYDROcodone-acetaminophen (NORCO) 10-325 MG tablet   . HYDROcodone-acetaminophen (NORCO) 10-325 MG tablet     Follow-up: Return in about 4 weeks (around 06/28/2019).   [name], MD

## 2019-05-31 NOTE — Assessment & Plan Note (Signed)
S/p open reduction and fixation by orthopedics. She has been having a difficult time completing her home exercises due to pain .  Will refill Vicodin for 30 days

## 2019-05-31 NOTE — Patient Instructions (Addendum)
You are due for colonoscopy and mammogram,  So I will make the referrals   Your annual mammogram has been ordered.  You are encouraged (required) to call to make your appointment at [facility]  I have refilled the hydrocodone for use every 6 hours IF NEEDED.  You should definitely take a dose before your ankle exercises each day so that you can complete them.  If you need continue hydrocodone after one month,  You will need to be seen.  otherwise I will refill the tramadol

## 2019-05-31 NOTE — Assessment & Plan Note (Signed)
Managed with  clonazepam dose to 0.25 mg once daily given her concurrent use of  vicodin. Continue zoloft 100 mg daily

## 2019-06-02 ENCOUNTER — Ambulatory Visit (INDEPENDENT_AMBULATORY_CARE_PROVIDER_SITE_OTHER): Payer: Medicare Other

## 2019-06-02 VITALS — Ht 64.0 in | Wt 194.0 lb

## 2019-06-02 DIAGNOSIS — Z0000 Encounter for general adult medical examination without abnormal findings: Secondary | ICD-10-CM

## 2019-06-02 NOTE — Progress Notes (Addendum)
Subjective:   Jodi [name] is a 74 y.o. female who presents for Medicare Annual (Subsequent) preventive examination.  Review of Systems:  No ROS.  Medicare Wellness Virtual Visit.  Visual/audio telehealth visit, UTA vital signs.   See social history for additional risk factors.   Cardiac Risk Factors include: advanced age (>24men, >35 women)     Objective:     Vitals: Ht 5\' 4"  (1.626 m)   Wt 194 lb (88 kg)   BMI 33.30 kg/m   Body mass index is 33.3 kg/m.  Advanced Directives 06/02/2019 03/09/2019 05/28/2018 05/26/2017 07/16/2016 05/23/2016  Does Patient Have a Medical Advance Directive? No No No No No No  Does patient want to make changes to medical advance directive? No - Patient declined - - - - -  Would patient like information on creating a medical advance directive? - No - Patient declined Yes (MAU/Ambulatory/Procedural Areas - Information given) Yes (MAU/Ambulatory/Procedural Areas - Information given) No - Patient declined Yes (MAU/Ambulatory/Procedural Areas - Information given)    Tobacco Social History   Tobacco Use  Smoking Status Never Smoker  Smokeless Tobacco Never Used     Counseling given: Not Answered   Clinical Intake:  Pre-visit preparation completed: Yes        Diabetes: No  How often do you need to have someone help you when you read instructions, pamphlets, or other written materials from your doctor or pharmacy?: 1 - Never  Interpreter Needed?: No     Past Medical History:  Diagnosis Date  . Anxiety   . Arthritis   . Diabetes mellitus without complication ([place])    diet controlled since gastric bypass  . Hypertension   . Sleep apnea    Past Surgical History:  Procedure Laterality Date  . BLADDER SUSPENSION    . CHOLECYSTECTOMY    . COLONOSCOPY WITH PROPOFOL N/A 07/16/2016   Procedure: COLONOSCOPY WITH PROPOFOL;  Surgeon: Jonathon Bellows, MD;  Location: [facility] ENDOSCOPY;  Service: Endoscopy;  Laterality: N/A;  . JOINT REPLACEMENT     left hip, right knee  . ROUX-EN-Y PROCEDURE  2010  . tracheotomy     at age 18 due to respiratroy failure   Family History  Problem Relation Age of Onset  . COPD Mother   . Hypertension Mother   . Ovarian cancer Mother 32       ovarian CA  . Breast cancer Daughter   . Breast cancer Paternal Aunt   . Breast cancer Paternal Grandmother   . Cancer Sister    Social History   Socioeconomic History  . Marital status: Widowed    Spouse name: Not on file  . Number of children: Not on file  . Years of education: Not on file  . Highest education level: Not on file  Occupational History  . Not on file  Tobacco Use  . Smoking status: Never Smoker  . Smokeless tobacco: Never Used  Substance and Sexual Activity  . Alcohol use: No  . Drug use: No  . Sexual activity: Never  Other Topics Concern  . Not on file  Social History Narrative  . Not on file   Social Determinants of Health   Financial Resource Strain:   . Difficulty of Paying Living Expenses:   Food Insecurity:   . Worried About Charity fundraiser in the Last Year:   . Arboriculturist in the Last Year:   Transportation Needs:   . Film/video editor (Medical):   Marland Kitchen  Lack of Transportation (Non-Medical):   Physical Activity:   . Days of Exercise per Week:   . Minutes of Exercise per Session:   Stress:   . Feeling of Stress :   Social Connections:   . Frequency of Communication with Friends and Family:   . Frequency of Social Gatherings with Friends and Family:   . Attends Religious Services:   . Active Member of Clubs or Organizations:   . Attends Archivist Meetings:   Marland Kitchen Marital Status:     Outpatient Encounter Medications as of 06/02/2019  Medication Sig  . amitriptyline (ELAVIL) 50 MG tablet TAKE 1 TABLET BY MOUTH EVERYDAY AT BEDTIME  . ascorbic acid (VITAMIN C) 500 MG tablet Take by mouth.  Marland Kitchen aspirin 81 MG EC tablet Take by mouth.  Marland Kitchen buPROPion (WELLBUTRIN SR) 100 MG 12 hr tablet TAKE 1 TABLET BY  MOUTH TWICE A DAY  . buPROPion (WELLBUTRIN SR) 150 MG 12 hr tablet TAKE 1 TABLET BY MOUTH TWICE A DAY  . clonazePAM (KLONOPIN) 0.5 MG tablet TAKE 1/2 TABLET BY MOUTH TWICE DAILY AS NEEDED  . Docusate Sodium (DSS) 100 MG CAPS Take by mouth.  . fluticasone (FLONASE) 50 MCG/ACT nasal spray SPRAY 2 SPRAYS INTO EACH NOSTRIL EVERY DAY  . hydrochlorothiazide (HYDRODIURIL) 25 MG tablet TAKE 1 TABLET BY MOUTH EVERY DAY  . HYDROcodone-acetaminophen (NORCO) 10-325 MG tablet Take 1 tablet by mouth every 6 (six) hours as needed for moderate pain.  Marland Kitchen omeprazole (PRILOSEC) 20 MG capsule TAKE 1 CAPSULE (20 MG TOTAL) BY MOUTH 2 (TWO) TIMES DAILY BEFORE A MEAL.  Marland Kitchen sertraline (ZOLOFT) 100 MG tablet Take 1 tablet (100 mg total) by mouth daily.  . traMADol (ULTRAM) 50 MG tablet TAKE 2 TABLETS (100 MG TOTAL) BY MOUTH EVERY 8 (EIGHT) HOURS AS NEEDED. FOR PAIN  . traZODone (DESYREL) 100 MG tablet TAKE 1 TABLET BY MOUTH TWICE A DAY  . Vitamin D, Ergocalciferol, (DRISDOL) 1.25 MG (50000 UNIT) CAPS capsule Take 50,000 Units by mouth once a week.   No facility-administered encounter medications on file as of 06/02/2019.    Activities of Daily Living In your present state of health, do you have any difficulty performing the following activities: 06/02/2019  Hearing? N  Vision? N  Difficulty concentrating or making decisions? N  Walking or climbing stairs? Y  Dressing or bathing? N  Doing errands, shopping? Y  Preparing Food and eating ? Y  Comment Family meal preps. Self feeds.  Using the Toilet? N  In the past six months, have you accidently leaked urine? Y  Comment Managed with daily brief  Do you have problems with loss of bowel control? N  Managing your Medications? N  Managing your Finances? Y  Comment Daughter assist as needed  Housekeeping or managing your Housekeeping? Y  Some recent data might be hidden    Patient Care Team: [name], MD as PCP - General (Internal Medicine)    Assessment:    This is a routine wellness examination for Jodi [name].  I connected with Jodi [name] today by telephone and verified that I am speaking with the correct person using two identifiers. Location patient: home Location provider: work Persons participating in the virtual visit: patient, provider.   I discussed the limitations, risks, security and privacy concerns of performing an evaluation and management service by telephone and the availability of in person appointments. I also discussed with the patient that there may be a patient responsible charge related to this  service. The patient expressed understanding and verbally consented to this telephonic visit.    Interactive audio and video telecommunications were attempted between this provider and patient, however failed, due to patient having technical difficulties OR patient did not have access to video capability.  We continued and completed visit with audio only.  Some vital signs may be absent or patient reported.   Time Spent with patient on telephone encounter: 30 minutes  Health Maintenance Due: -Tdap vaccine- discussed; to be completed with doctor in visit or local pharmacy.   See completed HM at the end of note.   Eye: Visual acuity not assessed. Virtual visit. Followed by their ophthalmologist.  Dental: Dentures- yes  Hearing: Demonstrates normal hearing during visit.  Safety:  Patient feels safe at home- yes Patient does have smoke detectors at home- yes Patient does wear sunscreen or protective clothing when in direct sunlight - yes Patient does wear seat belt when in a moving vehicle - yes Patient drives- no Adequate lighting in walkways free from debris- yes Grab bars and handrails used as appropriate- yes Ambulates with an assistive device- yes; walker, crutches, wheelchair Cell phone on person when ambulating outside of the home- yes  Social: Alcohol intake - no   Smoking history- never   Smokers in home? Son and  girlfriend  Illicit drug use? none  Medication: Taking as directed and without issues.  Pill box in use -yes  Self managed - yes   Covid-19: Precautions and sickness symptoms discussed. Wears mask, social distancing, hand hygiene as appropriate.   Activities of Daily Living Patient denies needing assistance with: feeding themselves, getting from bed to chair, getting to the toilet, bathing/showering, dressing.  Family assist with household chores, managing money, or preparing meals.  Discussed the importance of a healthy diet, water intake and the benefits of aerobic exercise.    Physical activity- active at home, no routine. Encouraged to stay active.   Diet:  Regular Water: good intake Caffeine:   Other Providers Patient Care Team: [name], MD as PCP - General (Internal Medicine)  Exercise Activities and Dietary recommendations Current Exercise Habits: The patient does not participate in regular exercise at present  Goals    . Weight (lb) < 160 lb (72.6 kg)     Reduce sugar intake       Fall Risk Fall Risk  06/02/2019 05/31/2019 11/11/2018 08/27/2018 05/26/2017  Falls in the past year? (No Data) 1 1 0 No  Comment None since last reported less than 1 week ago. - - - -  Number falls in past yr: - 1 1 - -  Injury with Fall? - 1 0 - -  Risk for fall due to : Impaired balance/gait - History of fall(s) - -  Risk for fall due to: Comment - - - - -  Follow up Falls evaluation completed Falls evaluation completed Falls evaluation completed - -   Is the patient's home free of loose throw rugs in walkways, pet beds, electrical cords, etc?   Yes         Handrails on the stairs?  Yes      Adequate lighting?   Yes  Timed Get Up and Go performed:no, virtual visit  Depression Screen PHQ 2/9 Scores 06/02/2019 05/28/2018 05/26/2017 08/09/2016  PHQ - 2 Score 1 1 0 0  PHQ- 9 Score - - - -     Cognitive Function MMSE - Mini Mental State Exam 05/26/2017 05/23/2016  Orientation to  time  5 5  Orientation to Place 5 5  Registration 3 3  Attention/ Calculation 5 2  Attention/Calculation-comments - Difficulty performing simple calculations  Recall 2 3  Language- name 2 objects 2 2  Language- repeat 1 1  Language- follow 3 step command 3 3  Language- read & follow direction 1 1  Write a sentence 1 1  Copy design 1 1  Total score 29 27      6CIT Screen 06/02/2019 05/28/2018  What Year? 0 points 0 points  What month? 0 points 0 points  What time? 0 points 0 points  Count back from 20 - 0 points  Months in reverse 0 points 0 points  Repeat phrase - 0 points  Total Score - 0   Patient is alert and oriented x3. Patient denies difficulty focusing or concentrating. Patient likes to read and plays word search for brain health.  Immunization History  Administered Date(s) Administered  . Fluad Quad(high Dose 65+) 11/11/2018  . Influenza, High Dose Seasonal PF 12/08/2012, 02/10/2015, 02/07/2016, 11/11/2016, 11/19/2017  . Pneumococcal Conjugate-13 12/08/2012  . Pneumococcal Polysaccharide-23 10/22/2008, 02/10/2015  . Zoster 12/08/2009   Screening Tests Health Maintenance  Topic Date Due  . TETANUS/TDAP  Never done  . MAMMOGRAM  05/29/2017  . COVID-19 Vaccine (1) 06/16/2019 (Originally 10/27/1957)  . COLONOSCOPY  07/17/2019  . INFLUENZA VACCINE  08/08/2019  . URINE MICROALBUMIN  11/11/2019  . DEXA SCAN  Completed  . Hepatitis C Screening  Completed  . PNA vac Low Risk Adult  Completed      Plan:   Keep all routine maintenance appointments.   Follow up 06/25/19 @ 10:00  Medicare Attestation I have personally reviewed: The patient's medical and social history Their use of alcohol, tobacco or illicit drugs Their current medications and supplements The patient's functional ability including ADLs,fall risks, home safety risks, cognitive, and hearing and visual impairment Diet and physical activities Evidence for depression   I have reviewed and discussed  with patient certain preventive protocols, quality metrics, and best practice recommendations.      OBrien-Blaney, Daryus Sowash L, LPN  [id number]     I have reviewed the above information and agree with above.   Deborra Medina, MD

## 2019-06-02 NOTE — Patient Instructions (Addendum)
  Jodi [name] , Thank you for taking time to come for your Medicare Wellness Visit. I appreciate your ongoing commitment to your health goals. Please review the following plan we discussed and let me know if I can assist you in the future.   These are the goals we discussed: Goals    . Weight (lb) < 160 lb (72.6 kg)     Reduce sugar intake       This is a list of the screening recommended for you and due dates:  Health Maintenance  Topic Date Due  . Tetanus Vaccine  Never done  . Mammogram  05/29/2017  . COVID-19 Vaccine (1) 06/16/2019*  . Colon Cancer Screening  07/17/2019  . Flu Shot  08/08/2019  . Urine Protein Check  11/11/2019  . DEXA scan (bone density measurement)  Completed  .  Hepatitis C: One time screening is recommended by Center for Disease Control  (CDC) for  adults born from [birth year] through 1964.   Completed  . Pneumonia vaccines  Completed  *Topic was postponed. The date shown is not the original due date.

## 2019-06-14 ENCOUNTER — Other Ambulatory Visit: Payer: Self-pay | Admitting: Internal Medicine

## 2019-06-14 DIAGNOSIS — S99912A Unspecified injury of left ankle, initial encounter: Secondary | ICD-10-CM | POA: Diagnosis not present

## 2019-06-21 ENCOUNTER — Other Ambulatory Visit: Payer: Self-pay | Admitting: Internal Medicine

## 2019-06-25 ENCOUNTER — Ambulatory Visit (INDEPENDENT_AMBULATORY_CARE_PROVIDER_SITE_OTHER): Payer: Medicare Other | Admitting: Internal Medicine

## 2019-06-25 ENCOUNTER — Encounter: Payer: Self-pay | Admitting: Internal Medicine

## 2019-06-25 ENCOUNTER — Other Ambulatory Visit: Payer: Self-pay

## 2019-06-25 DIAGNOSIS — S82852S Displaced trimalleolar fracture of left lower leg, sequela: Secondary | ICD-10-CM

## 2019-06-25 MED ORDER — HYDROCODONE-ACETAMINOPHEN 10-325 MG PO TABS: 1.0000 | ORAL_TABLET | Freq: Two times a day (BID) | ORAL | 0 refills | Status: DC | PRN

## 2019-06-25 NOTE — Patient Instructions (Signed)
I will refill the vicodin for 3 more months.  Do not use more than two times daily   If your cough and sore thoat  do not feel better by Monday,  Please go get COVID TESTED

## 2019-06-25 NOTE — Progress Notes (Signed)
Telephone  Note  This visit type was conducted due to national recommendations for restrictions regarding the COVID-19 pandemic (e.g. social distancing).  This format is felt to be most appropriate for this patient at this time.  All issues noted in this document were discussed and addressed.  No physical exam was performed (except for noted visual exam findings with Video Visits).   I connected with@ on 06/25/19 at 10:00 AM EDT by telephone and verified that I am speaking with the correct person using two identifiers. Location patient: home Location provider: work or home office Persons participating in the virtual visit: patient, provider  I discussed the limitations, risks, security and privacy concerns of performing an evaluation and management service by telephone and the availability of in person appointments. I also discussed with the patient that there may be a patient responsible charge related to this service. The patient expressed understanding and agreed to proceed.  Reason for visit: follow up  HPI:  74 yr old female with acute on chronic pain secondary to recent open fixation surgery to correct a trimalleolar fracture in May. Jodi [name]  1) Ankle pain:  She states that the pain continues to be most severe at night when trying to sleep.  Using hydrocodone and tramadol , both twice daily . Sleeping with foot on pillow until not tolerated. Still using a boot which feels really heavy .  Doing the prescribed  mobility exercises in the bed  with and without using a band .  Podiatry has been pleased with the healing process by serial examinations.   2) she has had a  cough since Wednesday accompanied by a sore throat. Minimally productive of purulent sputum. She is having rhinitis and congestion without facial pain, fevers or body aches,  But has had some excessive fatigue.  Had some contact with son who grandmother  who was recently hospitalized for bronchitis , mild sore throat. Living with  daughter and granddaughter  Who are healthy.    3) Obesity:  She has not lost any weight .  She has been nonweightbearing because of her surgery   ROS: See pertinent positives and negatives per HPI.  Past Medical History:  Diagnosis Date  . Anxiety   . Arthritis   . Diabetes mellitus without complication ([place])    diet controlled since gastric bypass  . Hypertension   . Sleep apnea     Past Surgical History:  Procedure Laterality Date  . BLADDER SUSPENSION    . CHOLECYSTECTOMY    . COLONOSCOPY WITH PROPOFOL N/A 07/16/2016   Procedure: COLONOSCOPY WITH PROPOFOL;  Surgeon: Jonathon Bellows, MD;  Location: [facility] ENDOSCOPY;  Service: Endoscopy;  Laterality: N/A;  . JOINT REPLACEMENT     left hip, right knee  . ROUX-EN-Y PROCEDURE  2010  . tracheotomy     at age 50 due to respiratroy failure    Family History  Problem Relation Age of Onset  . COPD Mother   . Hypertension Mother   . Ovarian cancer Mother 61       ovarian CA  . Breast cancer Daughter   . Breast cancer Paternal Aunt   . Breast cancer Paternal Grandmother   . Cancer Sister     SOCIAL HX:  reports that she has never smoked. She has never used smokeless tobacco. She reports that she does not drink alcohol and does not use drugs.   Current Outpatient Medications:  .  amitriptyline (ELAVIL) 50 MG tablet, TAKE 1 TABLET BY MOUTH EVERYDAY  AT BEDTIME, Disp: 90 tablet, Rfl: 1 .  buPROPion (WELLBUTRIN SR) 150 MG 12 hr tablet, TAKE 1 TABLET BY MOUTH TWICE A DAY, Disp: 180 tablet, Rfl: 1 .  clonazePAM (KLONOPIN) 0.5 MG tablet, TAKE 1/2 TABLET BY MOUTH TWICE DAILY AS NEEDED, Disp: 30 tablet, Rfl: 2 .  fluticasone (FLONASE) 50 MCG/ACT nasal spray, SPRAY 2 SPRAYS INTO EACH NOSTRIL EVERY DAY, Disp: 48 mL, Rfl: 0 .  hydrochlorothiazide (HYDRODIURIL) 25 MG tablet, TAKE 1 TABLET BY MOUTH EVERY DAY, Disp: 90 tablet, Rfl: 1 .  [START ON 07/01/2019] HYDROcodone-acetaminophen (NORCO) 10-325 MG tablet, Take 1 tablet by mouth 2 (two) times  daily as needed for moderate pain., Disp: 60 tablet, Rfl: 0 .  [START ON 07/31/2019] HYDROcodone-acetaminophen (NORCO) 10-325 MG tablet, Take 1 tablet by mouth 2 (two) times daily as needed for moderate pain., Disp: 60 tablet, Rfl: 0 .  omeprazole (PRILOSEC) 20 MG capsule, TAKE 1 CAPSULE (20 MG TOTAL) BY MOUTH 2 (TWO) TIMES DAILY BEFORE A MEAL., Disp: 180 capsule, Rfl: 1 .  sertraline (ZOLOFT) 100 MG tablet, Take 1 tablet (100 mg total) by mouth daily., Disp: 90 tablet, Rfl: 1 .  traZODone (DESYREL) 100 MG tablet, TAKE 1 TABLET BY MOUTH TWICE A DAY, Disp: 180 tablet, Rfl: 1 .  Vitamin D, Ergocalciferol, (DRISDOL) 1.25 MG (50000 UNIT) CAPS capsule, Take 50,000 Units by mouth once a week., Disp: , Rfl:  .  buPROPion (WELLBUTRIN SR) 100 MG 12 hr tablet, TAKE 1 TABLET BY MOUTH TWICE A DAY (Patient not taking: Reported on 06/25/2019), Disp: 180 tablet, Rfl: 1 .  traMADol (ULTRAM) 50 MG tablet, TAKE 2 TABLETS (100 MG TOTAL) BY MOUTH EVERY 8 (EIGHT) HOURS AS NEEDED. FOR PAIN (Patient not taking: Reported on 06/25/2019), Disp: 180 tablet, Rfl: 2  EXAM:   General impression: alert, cooperative and articulate.  No signs of being in distress  Lungs: speech is fluent sentence length suggests that patient is not short of breath and not punctuated by cough, sneezing or sniffing. Jodi [name]   Psych: affect normal.  speech is articulate and non pressured .  Denies suicidal thoughts   ASSESSMENT AND PLAN:  Discussed the following assessment and plan:  Trimalleolar fracture of ankle, closed, left, sequela  Trimalleolar fracture of ankle, closed, left, sequela Continues to report pain with weightbearing and sleeping.  Using vicodin twice daily ,  Will refill for another 90 days to encourage adherence with rehab exercises.     I discussed the assessment and treatment plan with the patient. The patient was provided an opportunity to ask questions and all were answered. The patient agreed with the plan and demonstrated an  understanding of the instructions.   The patient was advised to call back or seek an in-person evaluation if the symptoms worsen or if the condition fails to improve as anticipated.  I provided 22 minutes of non-face-to-face time during this encounter.   [name], MD

## 2019-06-25 NOTE — Assessment & Plan Note (Addendum)
Continues to report pain with weightbearing and sleeping.  Using vicodin twice daily ,  Will refill for another 90 days to encourage adherence with rehab exercises.

## 2019-07-19 ENCOUNTER — Other Ambulatory Visit: Payer: Self-pay | Admitting: Internal Medicine

## 2019-07-19 NOTE — Telephone Encounter (Signed)
Refill request for tramadol, last seen 05-31-19, last filled 03-03-19.  Please advise.

## 2019-07-26 DIAGNOSIS — S82302D Unspecified fracture of lower end of left tibia, subsequent encounter for closed fracture with routine healing: Secondary | ICD-10-CM | POA: Diagnosis not present

## 2019-07-26 DIAGNOSIS — S82832D Other fracture of upper and lower end of left fibula, subsequent encounter for closed fracture with routine healing: Secondary | ICD-10-CM | POA: Diagnosis not present

## 2019-07-26 DIAGNOSIS — S82892D Other fracture of left lower leg, subsequent encounter for closed fracture with routine healing: Secondary | ICD-10-CM | POA: Diagnosis not present

## 2019-09-12 ENCOUNTER — Other Ambulatory Visit: Payer: Self-pay | Admitting: Internal Medicine

## 2019-09-21 ENCOUNTER — Telehealth: Payer: Self-pay | Admitting: Internal Medicine

## 2019-09-21 NOTE — Telephone Encounter (Signed)
Pt dropped of handicap placard to be filled out. Placed in folder up front. Please call when complete

## 2019-09-22 NOTE — Telephone Encounter (Signed)
Placed in quick sign folder.  

## 2019-09-23 ENCOUNTER — Other Ambulatory Visit: Payer: Self-pay | Admitting: Internal Medicine

## 2019-09-23 NOTE — Telephone Encounter (Signed)
Form has been completed and placed up front for pick up. Pt is aware.

## 2019-09-23 NOTE — Telephone Encounter (Signed)
Pended for your approval or denial.

## 2019-10-01 ENCOUNTER — Ambulatory Visit (INDEPENDENT_AMBULATORY_CARE_PROVIDER_SITE_OTHER): Payer: Medicare Other

## 2019-10-01 ENCOUNTER — Other Ambulatory Visit: Payer: Self-pay

## 2019-10-01 DIAGNOSIS — Z23 Encounter for immunization: Secondary | ICD-10-CM

## 2019-11-01 ENCOUNTER — Other Ambulatory Visit: Payer: Self-pay | Admitting: Internal Medicine

## 2019-11-01 DIAGNOSIS — S8252XD Displaced fracture of medial malleolus of left tibia, subsequent encounter for closed fracture with routine healing: Secondary | ICD-10-CM | POA: Diagnosis not present

## 2019-11-01 DIAGNOSIS — S82892D Other fracture of left lower leg, subsequent encounter for closed fracture with routine healing: Secondary | ICD-10-CM | POA: Diagnosis not present

## 2019-11-01 DIAGNOSIS — S8262XD Displaced fracture of lateral malleolus of left fibula, subsequent encounter for closed fracture with routine healing: Secondary | ICD-10-CM | POA: Diagnosis not present

## 2019-11-08 ENCOUNTER — Other Ambulatory Visit: Payer: Self-pay | Admitting: Internal Medicine

## 2019-12-15 ENCOUNTER — Other Ambulatory Visit: Payer: Self-pay | Admitting: Internal Medicine

## 2019-12-15 NOTE — Telephone Encounter (Signed)
RX Refill:Tramadol Last Seen:06-25-19 Last ordered:07-20-19

## 2019-12-19 ENCOUNTER — Other Ambulatory Visit: Payer: Self-pay | Admitting: Internal Medicine

## 2019-12-30 ENCOUNTER — Other Ambulatory Visit: Payer: Self-pay | Admitting: Internal Medicine

## 2019-12-30 NOTE — Telephone Encounter (Signed)
RX Refill:Klonopin Last Seen: Last ordered:09-24-19

## 2019-12-30 NOTE — Telephone Encounter (Signed)
I see that Dr Aundra Dubin refilled for 30 days . Please call patient nad notify her that this was  COURTESY refill because it has been > 6 months sine hr last app.  If appt is not scheduled and kept we will not be able to refill again

## 2020-01-27 ENCOUNTER — Other Ambulatory Visit: Payer: Self-pay | Admitting: Internal Medicine

## 2020-01-27 NOTE — Telephone Encounter (Signed)
Refill for 30 days only.  OFFICE VISIT NEEDED prior to any more refills 

## 2020-01-27 NOTE — Telephone Encounter (Signed)
RX Refill:tramadol Last Seen:06-25-19 Last ordered:12-16-19

## 2020-01-28 NOTE — Telephone Encounter (Signed)
Attempted to call pt. No answer no voicemail. Pt needs to be scheduled for a follow up in order to get any more refills.

## 2020-02-02 ENCOUNTER — Telehealth: Payer: Self-pay | Admitting: Internal Medicine

## 2020-02-02 MED ORDER — CLONAZEPAM 0.5 MG PO TABS: ORAL_TABLET | ORAL | 0 refills | Status: DC

## 2020-02-02 NOTE — Telephone Encounter (Signed)
Patient has been scheduled [id number]

## 2020-02-02 NOTE — Telephone Encounter (Signed)
Patient called in for refill for clonazePAM (KLONOPIN) 0.5 MG tablet 

## 2020-02-02 NOTE — Telephone Encounter (Signed)
RX Refill:klonopin Last Seen:06-25-19 Last ordered:12-30-19

## 2020-02-02 NOTE — Telephone Encounter (Signed)
PLEASE SCHEDULE OV.  Please notify patient that the prescription  was Refilled for 30 days only because it has been 6 months since last visit. Marland Kitchen  OFFICE VISIT NEEDED prior to any more refills

## 2020-02-03 NOTE — Telephone Encounter (Signed)
Pt has scheduled an appt.

## 2020-02-14 ENCOUNTER — Ambulatory Visit: Payer: Medicare Other | Admitting: Internal Medicine

## 2020-02-15 ENCOUNTER — Other Ambulatory Visit: Payer: Self-pay

## 2020-02-15 ENCOUNTER — Ambulatory Visit (INDEPENDENT_AMBULATORY_CARE_PROVIDER_SITE_OTHER): Payer: Medicare Other | Admitting: Internal Medicine

## 2020-02-15 ENCOUNTER — Encounter: Payer: Self-pay | Admitting: Internal Medicine

## 2020-02-15 VITALS — BP 160/88 | HR 92 | Temp 98.9°F | Ht 64.02 in | Wt 184.0 lb

## 2020-02-15 DIAGNOSIS — I10 Essential (primary) hypertension: Secondary | ICD-10-CM

## 2020-02-15 DIAGNOSIS — S82852S Displaced trimalleolar fracture of left lower leg, sequela: Secondary | ICD-10-CM

## 2020-02-15 DIAGNOSIS — Z780 Asymptomatic menopausal state: Secondary | ICD-10-CM

## 2020-02-15 DIAGNOSIS — Z8639 Personal history of other endocrine, nutritional and metabolic disease: Secondary | ICD-10-CM

## 2020-02-15 DIAGNOSIS — E6609 Other obesity due to excess calories: Secondary | ICD-10-CM

## 2020-02-15 DIAGNOSIS — Z96642 Presence of left artificial hip joint: Secondary | ICD-10-CM | POA: Diagnosis not present

## 2020-02-15 DIAGNOSIS — R634 Abnormal weight loss: Secondary | ICD-10-CM | POA: Diagnosis not present

## 2020-02-15 DIAGNOSIS — M170 Bilateral primary osteoarthritis of knee: Secondary | ICD-10-CM | POA: Diagnosis not present

## 2020-02-15 DIAGNOSIS — I152 Hypertension secondary to endocrine disorders: Secondary | ICD-10-CM

## 2020-02-15 DIAGNOSIS — Z6833 Body mass index (BMI) 33.0-33.9, adult: Secondary | ICD-10-CM

## 2020-02-15 DIAGNOSIS — E1159 Type 2 diabetes mellitus with other circulatory complications: Secondary | ICD-10-CM

## 2020-02-15 DIAGNOSIS — Z1231 Encounter for screening mammogram for malignant neoplasm of breast: Secondary | ICD-10-CM | POA: Diagnosis not present

## 2020-02-15 DIAGNOSIS — D126 Benign neoplasm of colon, unspecified: Secondary | ICD-10-CM

## 2020-02-15 DIAGNOSIS — Z9884 Bariatric surgery status: Secondary | ICD-10-CM | POA: Diagnosis not present

## 2020-02-15 DIAGNOSIS — I672 Cerebral atherosclerosis: Secondary | ICD-10-CM

## 2020-02-15 MED ORDER — LOSARTAN POTASSIUM-HCTZ 50-12.5 MG PO TABS: 1.0000 | ORAL_TABLET | Freq: Every day | ORAL | 3 refills | Status: DC

## 2020-02-15 NOTE — Patient Instructions (Addendum)
I am changing your blood pressure to losartan/hctz because your blood pressure is too high  Return in 2 weeks for a BP check and a blood test  Your annual mammogram has been ordered.  You are encouraged (required) to call to make your appointment at [facility]    I have made a referral for your colonoscopy which was due last year because you had precancerous polyps removed in 2018 .   I will refill your tramadol for 6 months    See you in 6 months for your annual exam

## 2020-02-15 NOTE — Progress Notes (Signed)
Subjective:  Patient ID: Jodi [name], female    DOB: 12/30/1945  Age: 75 y.o. MRN: [id number]  CC: The primary encounter diagnosis was Breast cancer screening by mammogram. Diagnoses of Tubular adenoma of colon, Class 1 obesity due to excess calories without serious comorbidity with body mass index (BMI) of 33.0 to 33.9 in adult, Unintentional weight loss, History of gastric bypass, Personal history of diabetes mellitus, Elevated blood pressure reading with diagnosis of hypertension, Hypertension associated with diabetes ([place]), Osteoarthritis of both knees, unspecified osteoarthritis type, Postmenopausal estrogen deficiency, Trimalleolar fracture of ankle, closed, left, sequela, History of total left hip replacement, and Atherosclerotic cerebrovascular disease were also pertinent to this visit.  HPI CYBIL SENEGAL presents for follow up on chronic issues including hypertension, chronic pain managed with tramadol ,   depression, with GAD and obesity  s/p gastric bypass   This visit occurred during the SARS-CoV-2 public health emergency.  Safety protocols were in place, including screening questions prior to the visit, additional usage of staff PPE, and extensive cleaning of exam room while observing appropriate contact time as indicated for disinfecting solutions.   Patient has received NO  doses of the available COVID 19 vaccines by choice .   Patient continues to mask when outside of the home except when walking in yard or at safe distances from others .  Patient denies any change in mood or development of unhealthy behaviors resuting from the pandemic's restriction of activities and socialization.    Sebastiana has been Living with her son AND 27 YR OLD GRANDDAUGHTER since her ankle fracture last march which required surgical correction .  She has recovered fully , and is walking without a walker.  Her daughter has moved in to her previous home.  She appears happy with the living arrangements     1)  chronic knee pain:  Managed currently with tramadol 50 mg every 4 hours,  6 tablets daily.    Does not take tylenol   2) hypertension:   Has been Taking hctz .  NOT CHECKING BP.    3) Left trimalleolar ankle fracture occurred last march,  Still stiff in the mornings but improves with exercises. Last DEXA 2016    4) obesity: has lost 10 lbs unintentionally.  Appetite fine  5) HM: overdue for mammogram and colonoscopy    Outpatient Medications Prior to Visit  Medication Sig Dispense Refill  . amitriptyline (ELAVIL) 50 MG tablet TAKE 1 TABLET BY MOUTH EVERYDAY AT BEDTIME 90 tablet 1  . buPROPion (WELLBUTRIN SR) 100 MG 12 hr tablet TAKE 1 TABLET BY MOUTH TWICE A DAY 180 tablet 1  . buPROPion (WELLBUTRIN SR) 150 MG 12 hr tablet TAKE 1 TABLET BY MOUTH TWICE A DAY 180 tablet 1  . clonazePAM (KLONOPIN) 0.5 MG tablet TAKE 1/2 TABLET BY MOUTH TWICE DAILY AS NEEDED 30 tablet 0  . fluticasone (FLONASE) 50 MCG/ACT nasal spray SPRAY 2 SPRAYS INTO EACH NOSTRIL EVERY DAY 48 mL 0  . omeprazole (PRILOSEC) 20 MG capsule TAKE 1 CAPSULE BY MOUTH TWICE A DAY BEFORE MEALS 180 capsule 1  . sertraline (ZOLOFT) 100 MG tablet TAKE 1 TABLET BY MOUTH EVERY DAY 90 tablet 1  . traMADol (ULTRAM) 50 MG tablet TAKE 2 TABLETS BY MOUTH EVERY 8 HOURS AS NEEDED FOR PAIN 180 tablet 0  . traZODone (DESYREL) 100 MG tablet TAKE 1 TABLET BY MOUTH TWICE A DAY 180 tablet 1  . Vitamin D, Ergocalciferol, (DRISDOL) 1.25 MG (50000 UNIT) CAPS capsule  Take 50,000 Units by mouth once a week.    . hydrochlorothiazide (HYDRODIURIL) 25 MG tablet TAKE 1 TABLET BY MOUTH EVERY DAY 90 tablet 1   No facility-administered medications prior to visit.    Review of Systems;  Patient denies headache, fevers, malaise, unintentional weight loss, skin rash, eye pain, sinus congestion and sinus pain, sore throat, dysphagia,  hemoptysis , cough, dyspnea, wheezing, chest pain, palpitations, orthopnea, edema, abdominal pain, nausea, melena, diarrhea,  constipation, flank pain, dysuria, hematuria, urinary  Frequency, nocturia, numbness, tingling, seizures,  Focal weakness, Loss of consciousness,  Tremor, insomnia, depression, anxiety, and suicidal ideation.      Objective:  BP (!) 160/88 (BP Location: Left Arm, Patient Position: Sitting)   Pulse 92   Temp 98.9 F (37.2 C)   Ht 5' 4.02" (1.626 m)   Wt 184 lb (83.5 kg)   SpO2 92%   BMI 31.57 kg/m   BP Readings from Last 3 Encounters:  02/15/20 (!) 160/88  05/31/19 120/74  03/09/19 (!) 170/88    Wt Readings from Last 3 Encounters:  02/15/20 184 lb (83.5 kg)  06/25/19 194 lb (88 kg)  06/02/19 194 lb (88 kg)    General appearance: alert, cooperative and appears stated age Ears: normal TM's and external ear canals both ears Throat: lips, mucosa, and tongue normal; teeth and gums normal Neck: no adenopathy, no carotid bruit, supple, symmetrical, trachea midline and thyroid not enlarged, symmetric, no tenderness/mass/nodules Back: symmetric, no curvature. ROM normal. No CVA tenderness. Lungs: clear to auscultation bilaterally Heart: regular rate and rhythm, S1, S2 normal, no murmur, click, rub or gallop Abdomen: soft, non-tender; bowel sounds normal; no masses,  no organomegaly Pulses: 2+ and symmetric Skin: Skin color, texture, turgor normal. No rashes or lesions Lymph nodes: Cervical, supraclavicular, and axillary nodes normal.  Lab Results  Component Value Date   HGBA1C 5.9 02/15/2020   HGBA1C 5.6 11/11/2018   HGBA1C 5.7 06/02/2018    Lab Results  Component Value Date   CREATININE 1.15 02/15/2020   CREATININE 1.10 11/11/2018   CREATININE 1.27 (H) 06/10/2018    Lab Results  Component Value Date   WBC 5.1 02/15/2020   HGB 13.0 02/15/2020   HCT 38.7 02/15/2020   PLT 265.0 02/15/2020   GLUCOSE 113 (H) 02/15/2020   CHOL 178 02/15/2020   TRIG 97.0 02/15/2020   HDL 73.00 02/15/2020   LDLDIRECT 105.0 05/09/2016   LDLCALC 86 02/15/2020   ALT 16 02/15/2020   AST  20 02/15/2020   NA 140 02/15/2020   K 4.1 02/15/2020   CL 101 02/15/2020   CREATININE 1.15 02/15/2020   BUN 15 02/15/2020   CO2 31 02/15/2020   TSH 1.88 11/11/2018   HGBA1C 5.9 02/15/2020   MICROALBUR <0.7 02/15/2020    CT Head Wo Contrast  Result Date: 03/09/2019 CLINICAL DATA:  Fall EXAM: CT HEAD WITHOUT CONTRAST TECHNIQUE: Contiguous axial images were obtained from the base of the skull through the vertex without intravenous contrast. COMPARISON:  2008 FINDINGS: Brain: There is no acute intracranial hemorrhage, mass-effect, or edema. Gray-white differentiation is preserved. There is no extra-axial fluid collection. Ventricles and sulci are within normal limits in size and configuration. Vascular: There is mild atherosclerotic calcification at the skull base. Skull: Calvarium is unremarkable. Sinuses/Orbits: No acute finding. Other: None. IMPRESSION: No evidence of acute intracranial injury. Electronically Signed   By: Macy Mis M.D.   On: 03/09/2019 13:29   CT Maxillofacial Wo Contrast  Result Date: 03/09/2019 CLINICAL DATA:  Fall EXAM: CT MAXILLOFACIAL WITHOUT CONTRAST TECHNIQUE: Multidetector CT imaging of the maxillofacial structures was performed. Multiplanar CT image reconstructions were also generated. COMPARISON:  None. FINDINGS: Osseous: No acute facial fracture. Degenerative changes at the right greater than left temporomandibular joints. Orbits: No intraorbital hematoma Sinuses: Aerated. Soft tissues: Right infraorbital soft tissue swelling. Limited intracranial: Dictated separately. IMPRESSION: No acute facial fracture. Electronically Signed   By: Macy Mis M.D.   On: 03/09/2019 13:32    Assessment & Plan:   Problem List Items Addressed This Visit      Unprioritized   Atherosclerotic cerebrovascular disease    Noted on head CT done in March 2021.Marland Kitchen  She has no history of CAD or DM currently   . There is no indication of  statin use,  And LDL is < 100 on current  labs  Lab Results  Component Value Date   CHOL 178 02/15/2020   HDL 73.00 02/15/2020   LDLCALC 86 02/15/2020   LDLDIRECT 105.0 05/09/2016   TRIG 97.0 02/15/2020   CHOLHDL 2 02/15/2020         Relevant Medications   losartan-hydrochlorothiazide (HYZAAR) 50-12.5 MG tablet   DJD (degenerative joint disease) of knee    Vicodin has been discontinued due to lack of motivation to exercise.  Continue tramadol, refills given       History of gastric bypass   Relevant Orders   Vitamin B12 (Completed)   VITAMIN D 25 Hydroxy (Vit-D Deficiency, Fractures) (Completed)   DG Bone Density   History of total left hip replacement   Hypertension associated with diabetes ([place])    Not controlled on hctz alone.  Adding losartan given history of diabetes.  retrun in 2 weeks for RN visit and BMET   Lab Results  Component Value Date   MICROALBUR <0.7 02/15/2020   MICROALBUR <0.7 11/11/2018    Lab Results  Component Value Date   CREATININE 1.15 02/15/2020   Lab Results  Component Value Date   NA 140 02/15/2020   K 4.1 02/15/2020   CL 101 02/15/2020   CO2 31 02/15/2020         Relevant Medications   losartan-hydrochlorothiazide (HYZAAR) 50-12.5 MG tablet   Obesity   Relevant Orders   Lipid panel (Completed)   Personal history of diabetes mellitus    A1c is rising again despite weight loss due to lack of exercise and non adherence to low GI diet.  Advice given    Lab Results  Component Value Date   HGBA1C 5.9 02/15/2020         Relevant Orders   Hemoglobin A1c (Completed)   Trimalleolar fracture of ankle, closed, left, sequela   Relevant Orders   DG Bone Density    Other Visit Diagnoses    Breast cancer screening by mammogram    -  Primary   Relevant Orders   MM 3D SCREEN BREAST BILATERAL   Tubular adenoma of colon       Relevant Orders   Ambulatory referral to Gastroenterology   Unintentional weight loss       Relevant Orders   CBC with Differential/Platelet  (Completed)   Elevated blood pressure reading with diagnosis of hypertension       Relevant Medications   losartan-hydrochlorothiazide (HYZAAR) 50-12.5 MG tablet   Other Relevant Orders   Comprehensive metabolic panel (Completed)   Microalbumin / creatinine urine ratio (Completed)   Postmenopausal estrogen deficiency       Relevant Orders   DG Bone  Density      I have discontinued Dhanvi B. Rosen's hydrochlorothiazide. I am also having her start on losartan-hydrochlorothiazide. Additionally, I am having her maintain her buPROPion, Vitamin D (Ergocalciferol), buPROPion, amitriptyline, traZODone, fluticasone, omeprazole, sertraline, traMADol, and clonazePAM.  Meds ordered this encounter  Medications  . losartan-hydrochlorothiazide (HYZAAR) 50-12.5 MG tablet    Sig: Take 1 tablet by mouth daily.    Dispense:  90 tablet    Refill:  3    Medications Discontinued During This Encounter  Medication Reason  . hydrochlorothiazide (HYDRODIURIL) 25 MG tablet     Follow-up: Return in about 6 months (around 08/14/2020).   [name], MD

## 2020-02-16 ENCOUNTER — Encounter: Payer: Self-pay | Admitting: Internal Medicine

## 2020-02-16 DIAGNOSIS — I152 Hypertension secondary to endocrine disorders: Secondary | ICD-10-CM | POA: Insufficient documentation

## 2020-02-16 DIAGNOSIS — Z96642 Presence of left artificial hip joint: Secondary | ICD-10-CM | POA: Insufficient documentation

## 2020-02-16 DIAGNOSIS — I672 Cerebral atherosclerosis: Secondary | ICD-10-CM | POA: Insufficient documentation

## 2020-02-16 DIAGNOSIS — E1159 Type 2 diabetes mellitus with other circulatory complications: Secondary | ICD-10-CM | POA: Insufficient documentation

## 2020-02-16 LAB — COMPREHENSIVE METABOLIC PANEL
ALT: 16 U/L (ref 0–35)
AST: 20 U/L (ref 0–37)
Albumin: 4 g/dL (ref 3.5–5.2)
Alkaline Phosphatase: 79 U/L (ref 39–117)
BUN: 15 mg/dL (ref 6–23)
CO2: 31 mEq/L (ref 19–32)
Calcium: 9 mg/dL (ref 8.4–10.5)
Chloride: 101 mEq/L (ref 96–112)
Creatinine, Ser: 1.15 mg/dL (ref 0.40–1.20)
GFR: 47.33 mL/min — ABNORMAL LOW (ref >60.00)
Glucose, Bld: 113 mg/dL — ABNORMAL HIGH (ref 70–99)
Potassium: 4.1 mEq/L (ref 3.5–5.1)
Sodium: 140 mEq/L (ref 135–145)
Total Bilirubin: 0.5 mg/dL (ref 0.2–1.2)
Total Protein: 6.1 g/dL (ref 6.0–8.3)

## 2020-02-16 LAB — CBC WITH DIFFERENTIAL/PLATELET
Basophils Absolute: 0 10*3/uL (ref 0.0–0.1)
Basophils Relative: 0.8 % (ref 0.0–3.0)
Eosinophils Absolute: 0 10*3/uL (ref 0.0–0.7)
Eosinophils Relative: 0.5 % (ref 0.0–5.0)
HCT: 38.7 % (ref 36.0–46.0)
Hemoglobin: 13 g/dL (ref 12.0–15.0)
Lymphocytes Relative: 26 % (ref 12.0–46.0)
Lymphs Abs: 1.3 10*3/uL (ref 0.7–4.0)
MCHC: 33.5 g/dL (ref 30.0–36.0)
MCV: 90 fl (ref 78.0–100.0)
Monocytes Absolute: 0.5 10*3/uL (ref 0.1–1.0)
Monocytes Relative: 10.1 % (ref 3.0–12.0)
Neutro Abs: 3.2 10*3/uL (ref 1.4–7.7)
Neutrophils Relative %: 62.6 % (ref 43.0–77.0)
Platelets: 265 10*3/uL (ref 150.0–400.0)
RBC: 4.3 Mil/uL (ref 3.87–5.11)
RDW: 13.5 % (ref 11.5–15.5)
WBC: 5.1 10*3/uL (ref 4.0–10.5)

## 2020-02-16 LAB — LIPID PANEL
Cholesterol: 178 mg/dL (ref 0–200)
HDL: 73 mg/dL (ref >39.00)
LDL Cholesterol: 86 mg/dL (ref 0–99)
NonHDL: 105.05
Total CHOL/HDL Ratio: 2
Triglycerides: 97 mg/dL (ref 0.0–149.0)
VLDL: 19.4 mg/dL (ref 0.0–40.0)

## 2020-02-16 LAB — MICROALBUMIN / CREATININE URINE RATIO
Creatinine,U: 108.2 mg/dL
Microalb Creat Ratio: 0.6 mg/g (ref 0.0–30.0)
Microalb, Ur: <0.7 mg/dL (ref 0.0–1.9)

## 2020-02-16 LAB — VITAMIN B12: Vitamin B-12: 440 pg/mL (ref 211–911)

## 2020-02-16 LAB — VITAMIN D 25 HYDROXY (VIT D DEFICIENCY, FRACTURES): VITD: 42.74 ng/mL (ref 30.00–100.00)

## 2020-02-16 LAB — HEMOGLOBIN A1C: Hgb A1c MFr Bld: 5.9 % (ref 4.6–6.5)

## 2020-02-16 NOTE — Assessment & Plan Note (Addendum)
A1c is rising again despite weight loss due to lack of exercise and non adherence to low GI diet.  Advice given    Lab Results  Component Value Date   HGBA1C 5.9 02/15/2020

## 2020-02-16 NOTE — Assessment & Plan Note (Signed)
Noted on head CT done in March 2021.Marland Kitchen  She has no history of CAD or DM currently   . There is no indication of  statin use,  And LDL is < 100 on current labs  Lab Results  Component Value Date   CHOL 178 02/15/2020   HDL 73.00 02/15/2020   LDLCALC 86 02/15/2020   LDLDIRECT 105.0 05/09/2016   TRIG 97.0 02/15/2020   CHOLHDL 2 02/15/2020

## 2020-02-16 NOTE — Assessment & Plan Note (Signed)
Vicodin has been discontinued due to lack of motivation to exercise.  Continue tramadol, refills given

## 2020-02-16 NOTE — Assessment & Plan Note (Addendum)
Not controlled on hctz alone.  Adding losartan given history of diabetes.  retrun in 2 weeks for RN visit and BMET   Lab Results  Component Value Date   MICROALBUR <0.7 02/15/2020   MICROALBUR <0.7 11/11/2018    Lab Results  Component Value Date   CREATININE 1.15 02/15/2020   Lab Results  Component Value Date   NA 140 02/15/2020   K 4.1 02/15/2020   CL 101 02/15/2020   CO2 31 02/15/2020

## 2020-02-21 ENCOUNTER — Encounter: Payer: Self-pay | Admitting: Internal Medicine

## 2020-02-25 ENCOUNTER — Telehealth: Payer: Self-pay

## 2020-02-25 MED ORDER — LOSARTAN POTASSIUM-HCTZ 50-12.5 MG PO TABS: 1.0000 | ORAL_TABLET | Freq: Every day | ORAL | 3 refills | Status: DC

## 2020-02-25 NOTE — Telephone Encounter (Signed)
Pt called and states that [facility] in [place] does not have losartan-hydrochlorothiazide (HYZAAR) 50-12.5 MG tablet. Please send to [facility] in [place] ASAP

## 2020-02-28 ENCOUNTER — Telehealth: Payer: Self-pay | Admitting: *Deleted

## 2020-02-28 DIAGNOSIS — I152 Hypertension secondary to endocrine disorders: Secondary | ICD-10-CM

## 2020-02-28 DIAGNOSIS — R7303 Prediabetes: Secondary | ICD-10-CM

## 2020-02-28 NOTE — Telephone Encounter (Signed)
Please place future orders for lab appt.  

## 2020-02-28 NOTE — Telephone Encounter (Signed)
Fasting labs for May 2022 ordered

## 2020-02-29 ENCOUNTER — Ambulatory Visit: Payer: Medicare Other

## 2020-02-29 ENCOUNTER — Other Ambulatory Visit: Payer: Medicare Other

## 2020-03-02 ENCOUNTER — Other Ambulatory Visit: Payer: Self-pay | Admitting: Internal Medicine

## 2020-03-02 ENCOUNTER — Encounter: Payer: Self-pay | Admitting: *Deleted

## 2020-03-02 NOTE — Telephone Encounter (Signed)
RX Refill:klonopin Last Seen:02-15-20 Last ordered:02-02-20

## 2020-03-19 ENCOUNTER — Other Ambulatory Visit: Payer: Self-pay | Admitting: Internal Medicine

## 2020-03-20 NOTE — Telephone Encounter (Signed)
Last OV 02/15/20 last refill 01/27/20 okay to fill? Tramadol?

## 2020-03-21 ENCOUNTER — Other Ambulatory Visit: Payer: Self-pay | Admitting: Internal Medicine

## 2020-04-13 IMAGING — CT CT HEAD W/O CM
3 series · 16 of 46 positions shown, 19 images · non-contrast
Comparison: 4002

CLINICAL DATA: Fall

EXAM:
CT HEAD WITHOUT CONTRAST
TECHNIQUE: Contiguous axial images were obtained from the base of the skull
through the vertex without intravenous contrast.

[Series 2: head wo · axial · 0.39mm/px · z∈[+469,+589]mm · 10 of 29 slices shown, 13 images]
[im 3/29  brain]
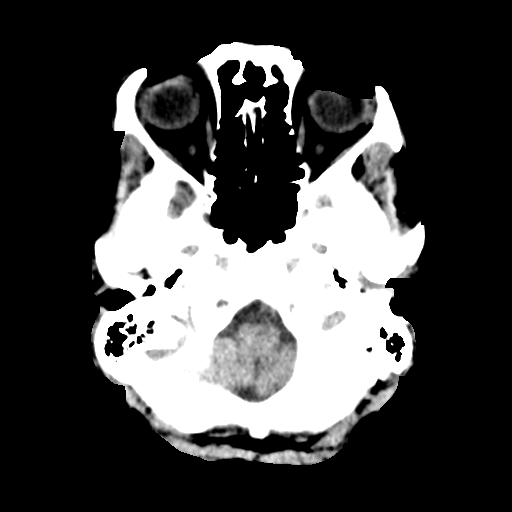
[im 3/29  bone]
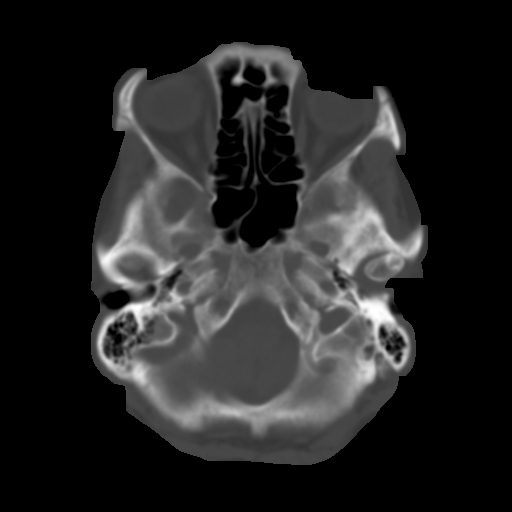
[im 6/29  brain]
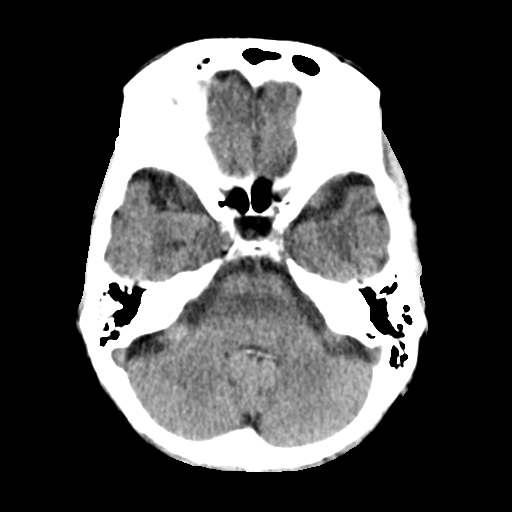
[im 8/29  brain]
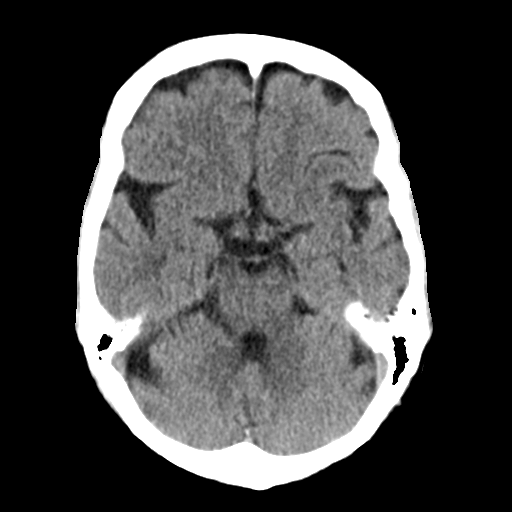
[im 11/29  brain]
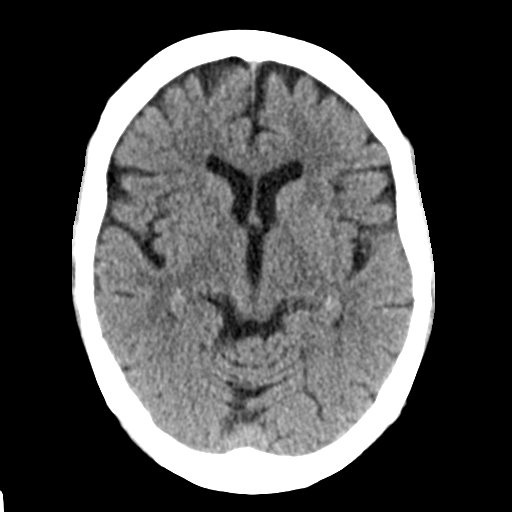
[im 14/29  brain]
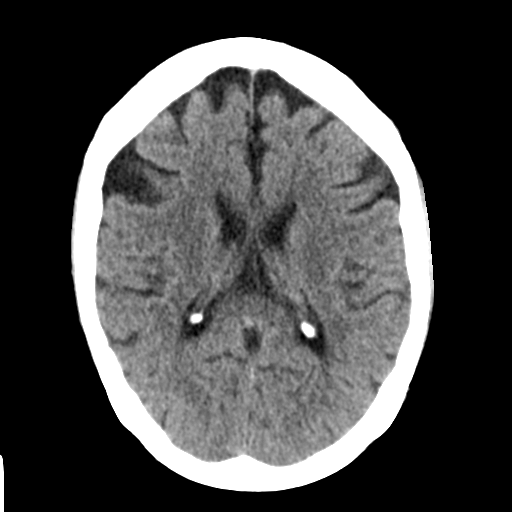
[im 14/29  bone]
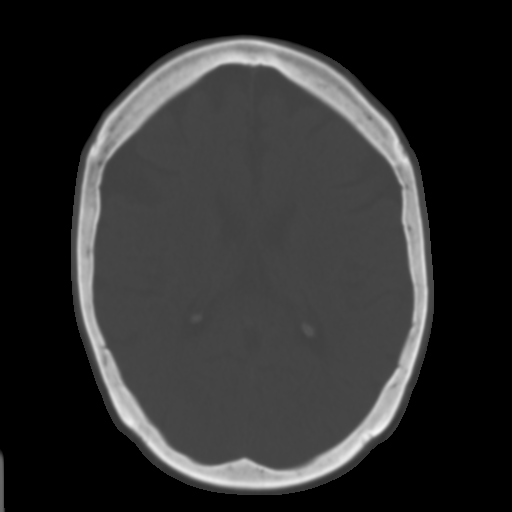
[im 16/29  brain]
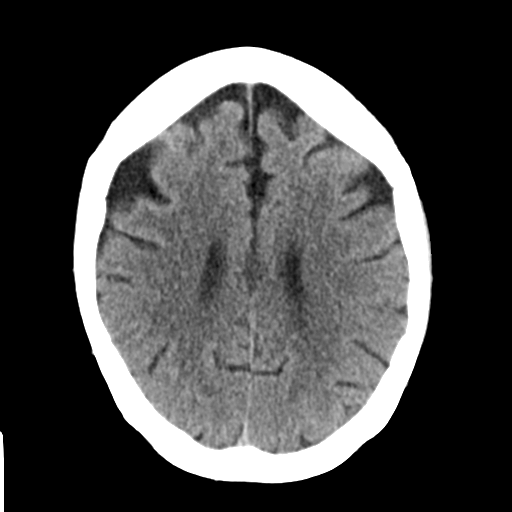
[im 19/29  brain]
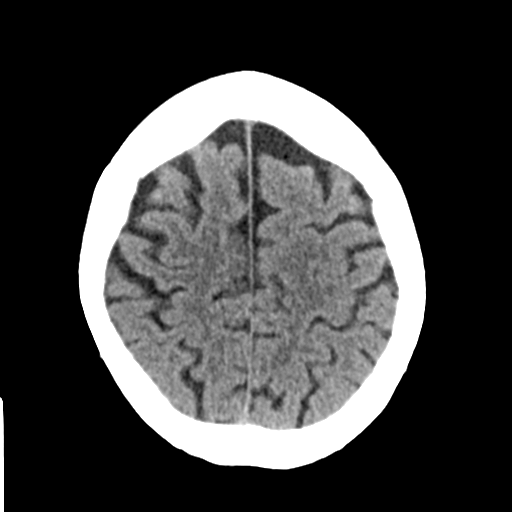
[im 22/29  brain]
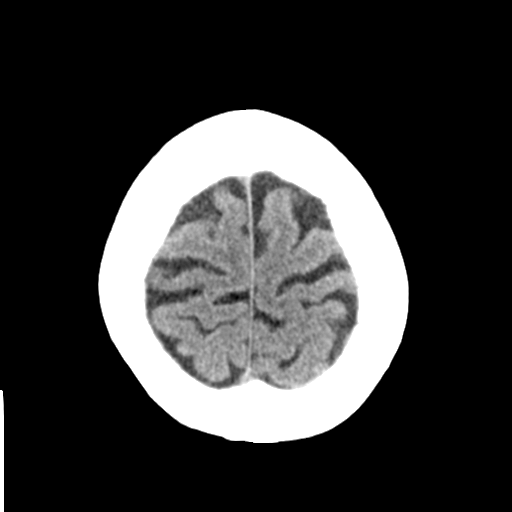
[im 24/29  brain]
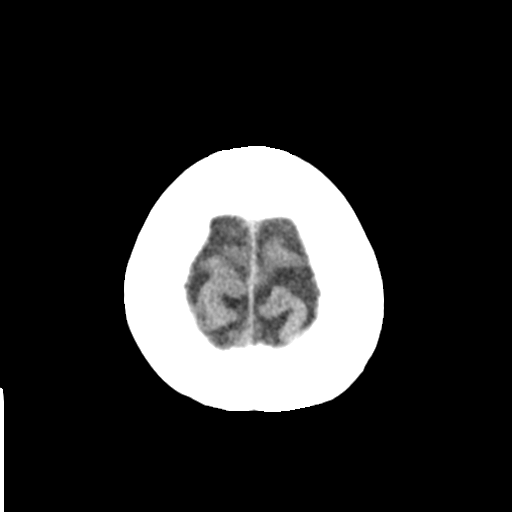
[im 24/29  bone]
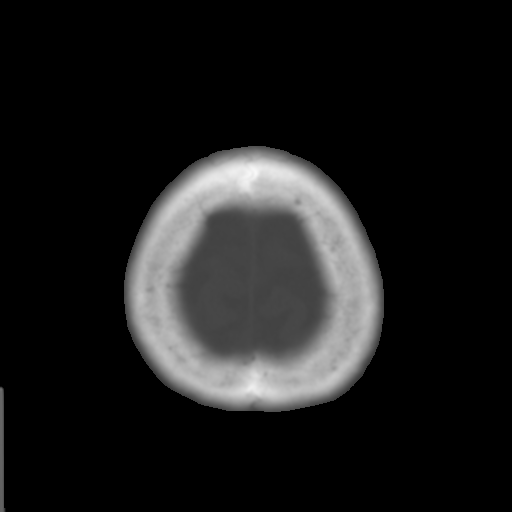
[im 27/29  brain]
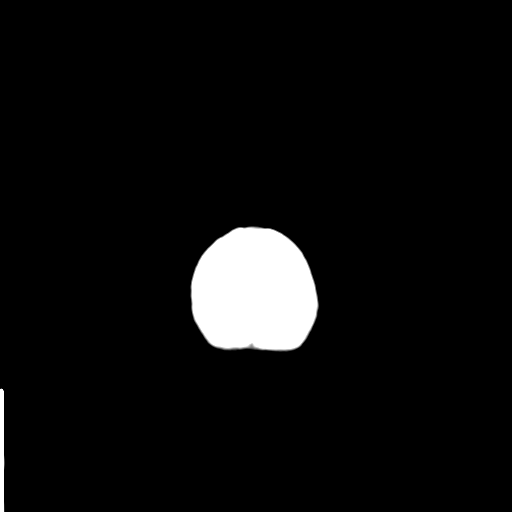

[Series 4: coronal soft tissue · coronal · 0.30mm/px · 3 of 61 slices shown]
[im 21/61  brain]
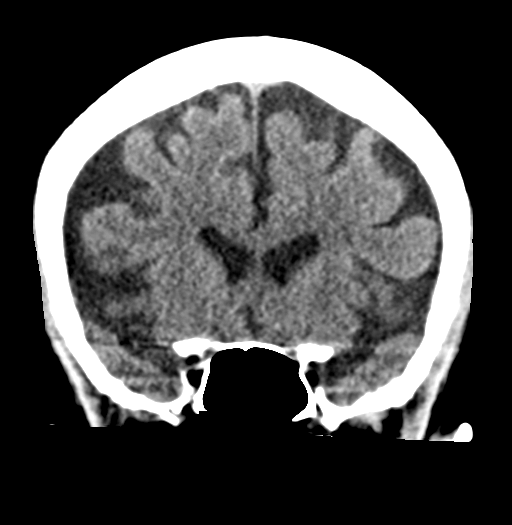
[im 27/61  brain]
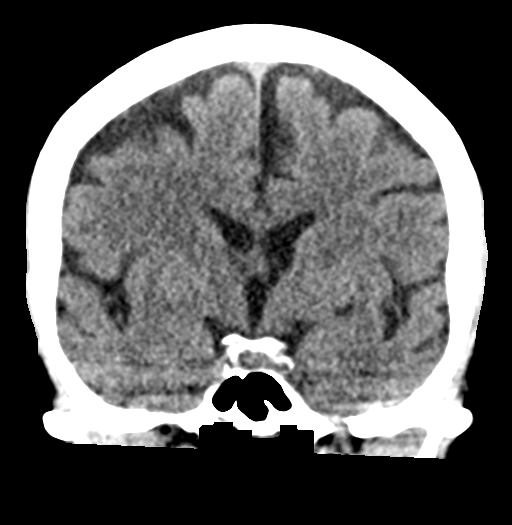
[im 34/61  brain]
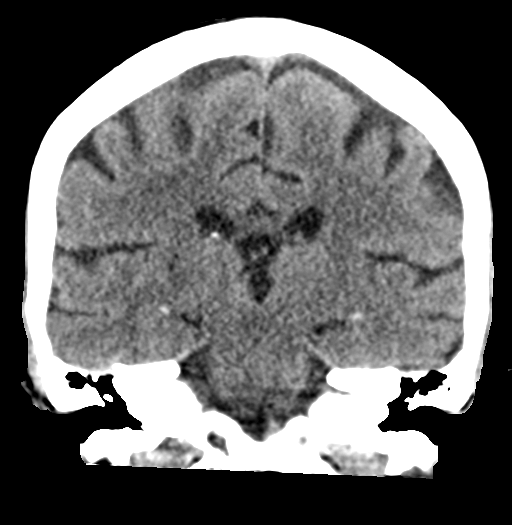

[Series 5: sagittal soft tissue · sagittal · 0.31mm/px · 3 of 52 slices shown]
[im 18/52  brain]
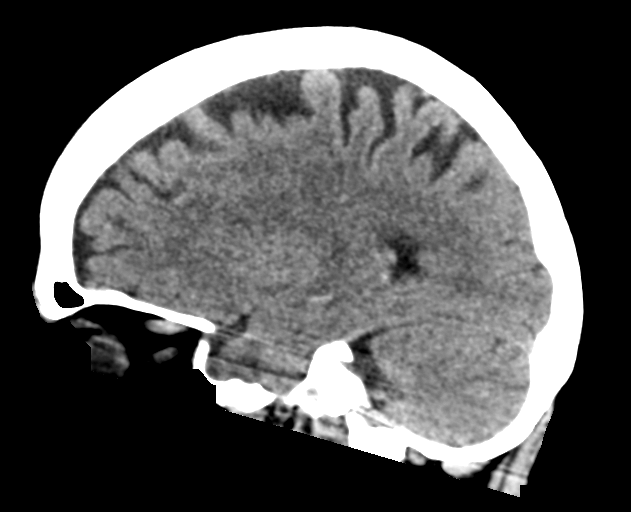
[im 26/52  brain]
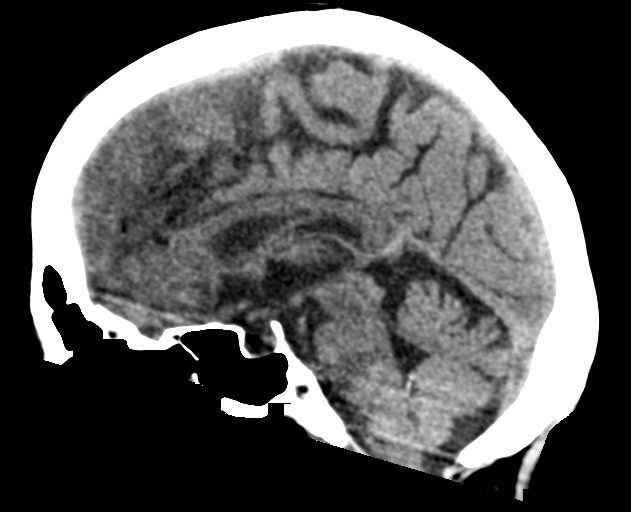
[im 35/52  brain]
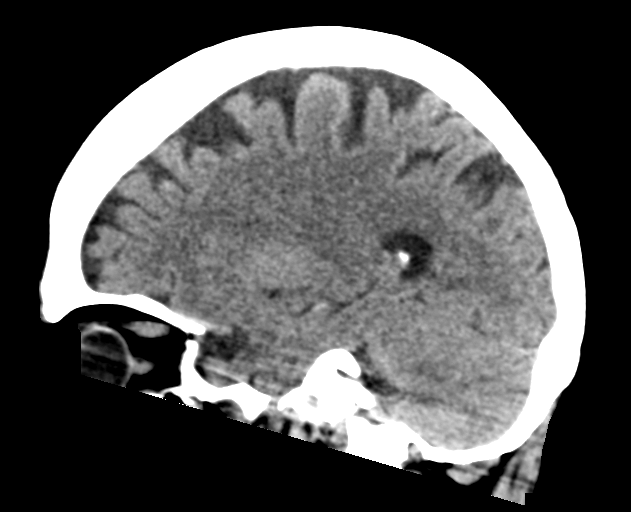

[16 of 46 positions shown; findings below may reference images not displayed]

FINDINGS: Brain: There is no acute intracranial hemorrhage, mass-effect, or
edema. Gray-white differentiation is preserved. There is no
extra-axial fluid collection. Ventricles and sulci are within normal
limits in size and configuration.

Vascular: There is mild atherosclerotic calcification at the skull
base.

Skull: Calvarium is unremarkable.

Sinuses/Orbits: No acute finding.

Other: None.
IMPRESSION: No evidence of acute intracranial injury.

## 2020-05-02 ENCOUNTER — Other Ambulatory Visit: Payer: Self-pay | Admitting: Internal Medicine

## 2020-05-02 NOTE — Telephone Encounter (Signed)
RX Refill:tramadol Last Seen:02-15-20 Last ordered:03-20-20

## 2020-05-03 NOTE — Telephone Encounter (Signed)
Refill for 30 days only.  OFFICE VISIT NEEDED prior to any more refills 

## 2020-05-06 ENCOUNTER — Other Ambulatory Visit: Payer: Self-pay | Admitting: Internal Medicine

## 2020-05-24 ENCOUNTER — Telehealth: Payer: Self-pay

## 2020-05-24 ENCOUNTER — Other Ambulatory Visit: Payer: Self-pay

## 2020-05-24 ENCOUNTER — Ambulatory Visit (INDEPENDENT_AMBULATORY_CARE_PROVIDER_SITE_OTHER): Payer: Medicare Other | Admitting: Internal Medicine

## 2020-05-24 ENCOUNTER — Encounter: Payer: Self-pay | Admitting: Internal Medicine

## 2020-05-24 VITALS — BP 146/70 | HR 73 | Temp 98.3°F | Ht 64.0 in | Wt 195.2 lb

## 2020-05-24 DIAGNOSIS — I672 Cerebral atherosclerosis: Secondary | ICD-10-CM | POA: Diagnosis not present

## 2020-05-24 DIAGNOSIS — Z1231 Encounter for screening mammogram for malignant neoplasm of breast: Secondary | ICD-10-CM | POA: Diagnosis not present

## 2020-05-24 DIAGNOSIS — M170 Bilateral primary osteoarthritis of knee: Secondary | ICD-10-CM

## 2020-05-24 DIAGNOSIS — E6609 Other obesity due to excess calories: Secondary | ICD-10-CM | POA: Diagnosis not present

## 2020-05-24 DIAGNOSIS — I152 Hypertension secondary to endocrine disorders: Secondary | ICD-10-CM | POA: Diagnosis not present

## 2020-05-24 DIAGNOSIS — E1159 Type 2 diabetes mellitus with other circulatory complications: Secondary | ICD-10-CM

## 2020-05-24 DIAGNOSIS — S82852S Displaced trimalleolar fracture of left lower leg, sequela: Secondary | ICD-10-CM | POA: Diagnosis not present

## 2020-05-24 DIAGNOSIS — Z6833 Body mass index (BMI) 33.0-33.9, adult: Secondary | ICD-10-CM

## 2020-05-24 MED ORDER — ATORVASTATIN CALCIUM 10 MG PO TABS: 10.0000 mg | ORAL_TABLET | Freq: Every day | ORAL | 3 refills | Status: DC
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
## 2020-05-24 NOTE — Chronic Care Management (AMB) (Signed)
  Chronic Care Management   Note  05/24/2020 Name: Jodi [name] MRN: [id number] DOB: [birth year]  Jodi [name] is a 75 y.o. year old female who is a primary care patient of Derrel Nip, Aris Everts, MD. I reached out to Jodi [name] by phone today in response to a referral sent by Ms. Landry Corporal Orlick's PCP, [name], MD     [name] was given information about Chronic Care Management services today including:  1. CCM service includes personalized support from designated clinical staff supervised by her physician, including individualized plan of care and coordination with other care providers 2. 24/7 contact phone numbers for assistance for urgent and routine care needs. 3. Service will only be billed when office clinical staff spend 20 minutes or more in a month to coordinate care. 4. Only one practitioner may furnish and bill the service in a calendar month. 5. The patient may stop CCM services at any time (effective at the end of the month) by phone call to the office staff. 6. The patient will be responsible for cost sharing (co-pay) of up to 20% of the service fee (after annual deductible is met).  Patient agreed to services and verbal consent obtained.   Follow up plan: Telephone appointment with care management team member scheduled for:05/25/2020  Noreene Larsson, [address], [place], [place] [address] Direct Dial: [phone] [name]

## 2020-05-24 NOTE — Patient Instructions (Signed)
   I am recommending that you start atorvastatin  Because you have  evidence of placque buildup in your brain and  Carotid ateries. . Remember that statins have been proved to do more than just lower cholesterol.   They stabilize and prevent placque rupture,  Which is what causes heart attacks and strokes.  Diet alone can't do that.   Please start the medication and return in 3 weeks for blood tests    I am letting you know that I am referring to our clinical pharmacist ,  [name], and our clinical social worker .  Catie helps me provide additional services to my patients who are on Medicare and dealing with  chronic diseases , like diabetes.  I do not expect this referral to cost you anything out of pocket, but I do think Catie will be able to help you get your diabetes under control and maximize your drug benefits.  She will make contact with  You by phone in the next week or so

## 2020-05-25 ENCOUNTER — Ambulatory Visit (INDEPENDENT_AMBULATORY_CARE_PROVIDER_SITE_OTHER): Payer: Medicare Other | Admitting: Pharmacist

## 2020-05-25 DIAGNOSIS — I10 Essential (primary) hypertension: Secondary | ICD-10-CM

## 2020-05-25 DIAGNOSIS — I672 Cerebral atherosclerosis: Secondary | ICD-10-CM

## 2020-05-25 DIAGNOSIS — M170 Bilateral primary osteoarthritis of knee: Secondary | ICD-10-CM | POA: Diagnosis not present

## 2020-05-25 DIAGNOSIS — E1159 Type 2 diabetes mellitus with other circulatory complications: Secondary | ICD-10-CM

## 2020-05-25 DIAGNOSIS — F419 Anxiety disorder, unspecified: Secondary | ICD-10-CM

## 2020-05-25 DIAGNOSIS — I152 Hypertension secondary to endocrine disorders: Secondary | ICD-10-CM

## 2020-05-25 DIAGNOSIS — Z9884 Bariatric surgery status: Secondary | ICD-10-CM

## 2020-05-25 NOTE — Patient Instructions (Addendum)
Jodi [name],   It was great talking to you today!  I am going to have my teammates call you. Di Kindle is our therapist/licensed clinical social worker, Sheppard Evens is our nurse Tourist information centre manager. They can both use their expertise to help me and Dr. Derrel Nip support you in your health.   A lot of your medications can increase your risk for being sleepy or drowsy, and this increases your risk for falls.   To reduce how much tramadol you are needing, let's try adding in two over the counter options- try taking Tylenol Arthritis (650 mg) 1-2 tablets 2-3 times daily. Do not take over 6 of these tablets daily. Start with 1 tablet twice daily for a few days and work up from there. Also try using the Voltaren Gel to your knees, elbows, hands, and shoulder joints for your arthritis pain. It would be GREAT if adding these two things could minimize how much tramadol you are needing to take every day.   We'd love to see you need to take less clonazepam every day. This medication can be VERY sedating. We can talk to Dr. Derrel Nip about how we might adjust your medication regimen moving forward to see if we can help you better manage your anxiety.   Please take your second bupropion dose around 1-2 pm. Taking it too close to bedtime can make it hard for you to sleep... can require more trazodone or amitritypline or clonazepam, which increase your risk for falls!   Please call the customer service phone number on the back of your [employer] insurance card to talk about 1) Over the counter benefits to order a blood pressure machine and 2) how to start getting your medications filled through the [facility] mail order pharmacy.   I recommend you get a pill box and take your medications like this:   Morning: - Bupropion SR 150 mg (mood) - Trazodone 100 mg (mood) - Sertraline 100 mg (mood) - Losartan/HCTZ 50/12.5 mg (blood pressure) - Omeprazole 20 mg (acid reflux)  Midday (1-2 pm): - Bupropion SR 150 mg  (mood)  Evening: - Trazodone 100 mg (mood) - Amitriptyline 50 mg (mood, sleep) - Atorvastatin 40 mg (cholesterol, heart attack/stroke risk reduction) - Aspirin 81 mg (heart attack/stroke risk reduction) - Omeprazole 20 mg (acid reflux)  As needed medications:  - Tramadol 50 mg, 1-2 tablets up to every 8 hours as needed for pain - Clonazepam 0.5 mg, 1/2 tablet up to twice daily as needed for anxiety   Call me with any questions or concerns!  [name], PharmD [phone]  Visit Information   PATIENT GOALS:  Goals Addressed              This Visit's Progress     Patient Stated   .  Medication Management (pt-stated)        Patient Goals/Self-Care Activities . Over the next 90 days, patient will:  - take medications as prescribed collaborate with provider on medication access solutions       Consent to CCM Services: Ms. Gilchrest was given information about Chronic Care Management services today including:  1. CCM service includes personalized support from designated clinical staff supervised by her physician, including individualized plan of care and coordination with other care providers 2. 24/7 contact phone numbers for assistance for urgent and routine care needs. 3. Service will only be billed when office clinical staff spend 20 minutes or more in a month to coordinate care. 4. Only one practitioner may furnish and  bill the service in a calendar month. 5. The patient may stop CCM services at any time (effective at the end of the month) by phone call to the office staff. 6. The patient will be responsible for cost sharing (co-pay) of up to 20% of the service fee (after annual deductible is met).  Patient agreed to services and verbal consent obtained.   The patient verbalized understanding of instructions, educational materials, and care plan provided today and agreed to receive a mailed copy of patient instructions, educational materials, and care plan.   Plan:  Telephone follow up appointment with care management team member scheduled for:  ~ 8 weeks  [name], PharmD, [place], CPP Clinical Pharmacist [facility]: Patient Care Plan: Medication Management    Problem Identified: Falls Risk Reduction, Depression, Anxiety, HLD     Long-Range Goal: Disease Progression Prevention   Start Date: 05/25/2020  This Visit's Progress: On track  Priority: High  Note:   Current Barriers:  . Suboptimal therapeutic regimen for depression, anxiety, pain, given elevated risk of falls  Pharmacist Clinical Goal(s):  Marland Kitchen Over the next 90 days, patient will achieve adherence to monitoring guidelines and medication adherence to achieve therapeutic efficacy through collaboration with PharmD and provider.   Interventions: . 1:1 collaboration with [name], MD regarding development and update of comprehensive plan of care as evidenced by provider attestation and co-signature . Inter-disciplinary care team collaboration (see longitudinal plan of care) . Comprehensive medication review performed; medication list updated in electronic medical record  Health Maintenance: . Would like to transfer medications to mail order. She lost the phone number for [facility] mail order. Advised to contact customer service number on the back of her card to set up profile w/ [facility] and discuss having chronic meds transferred. Discussed that she likely will need to continue to get tramadol and clonazepam from local pharmacy.   Hypertension: . Uncontrolled per last clinic visit; current treatment: losartan/HCTZ 50/12.5 mg QAM;  . Current home readings: reports that her meter "runs high" as compared to a home insurance nurse check. Previously bought the meter for $10 at a [employer], clinical (histocompatibility and immunogenetics). Notes that she has $40 in OTC benefits, but phone number is rubbed off . Reviewed goal BP <140/90, lenient goal given falls  risk . Advised to contact customer service number on the back of her card to discuss where to call for OTC benefits. Recommend acquiring new arm BP cuff. . Will collaborate w/ RN CM for disease management education and support . Recommend to continue current regimen at this time  Hyperlipidemia: . Uncontrolled; current treatment: planning to start atorvastatin, has not picked up from the pharmacy yet  . Baseline LDL 86, 10 year ASCVD risk 36.7% . Recommend to pick up medication and start as prescribed by PCP yesterday. Will follow for side effects moving forward.   Depression/Anxiety . Uncontrolled and suboptimally managed; current treatment: sertraline 100 mg daily, clonazepam 0.25 mg BID PRN - taking BID daily, trazodone 100 mg BID, amitriptyline 50 mg QPM, bupropion SR 150 mg BID - taking second dose in the evening . Reports family stress related to son, granddaughter, daughter. Notes her "nerves" require clonazepam BID. Unsure how long she has been on sertraline.   Liana Gerold referral to LCSW for mental health support. Patient amenable.  . Discussed risks of multiple CNS sedating medications and falls risk, particularly in combination with opioid therapy. Patient already notes that she is "tired  and just fatigued" throughout the day. Notes that she feels she can't walk in a straight line. Bupropion can be more activating, impacting anxiety and sleep. Recommend taking second dose of bupropion in the early afternoon to prevent contribution to insomnia, especially given need for amitriptyline and trazodone BID. Alternatively, consider change to ER formulation to be taken once daily, reducing need for timing second dose appropriately to prevent impact on sleep.  . Consider swapping sertraline for duloxetine moving forward to target OA pain as well, as patient reports she has been on sertraline for several years. Per chart review, sertraline has been charted since 2012.  Marland Kitchen Continue to encourage  non-pharmacological methods of stress reduction to reduce need for clonazepam given falls risk, increased risk of opioid overdose.   OA Pain, Hx Ankle Fracture 03/2019: . Uncontrolled and suboptimally managed; current treatment; tramadol 50-100 mg Q8H PRN - patient taking 100 mg BID consistently. Reports OTC use of ibuprofen.  . Extensive discussion regarding risks of opioid related overdose in combination with benzodiazepine.  . Counseled on nephrotoxic impact of NSAIDs, advised to STOP ibuprofen. . Discussed use of around the clock acetaminophen to hopefully reduce need for tramadol. Advised Tylenol arthritis 650 mg 2-3 times daily, regularly. Also advised addition of OTC Voltaren gel 2-3 times daily to knee, hand, elbow, shoulder joints. Discussed that reducing tramadol through use of other agents would be beneficial for her long term health. Also consider duloxetine as above. Will discuss w/ PCP.  GERD: . Controlled but potentially suboptimally managed; current regimen: omeprazole 20 mg BID . Patient does not remember ever trying omeprazole once daily. No documented hx gastritis, Barrett's. Appears bariatric clinic had advised her to increase omeprazole to BID dosing several years ago given increased reflux symptoms with regular NSAID dosing.  . Moving forward, consider trial of once daily PPI for more appropriate treatment.   Patient Goals/Self-Care Activities . Over the next 90 days, patient will:  - take medications as prescribed collaborate with provider on medication access solutions  Follow Up Plan: Telephone follow up appointment with care management team member scheduled for: ~ 8 weeks

## 2020-05-25 NOTE — Chronic Care Management (AMB) (Signed)
  Chronic Care Management Pharmacy Note  05/25/2020 Name:  Jodi [name] MRN:  [id number] DOB:  12/07/1945  Subjective: Jodi [name] is an 74 y.o. year old female who is a primary patient of Tullo, Teresa L, MD.  The CCM team was consulted for assistance with disease management and care coordination needs.    Engaged with patient by telephone for initial visit in response to provider referral for pharmacy case management and/or care coordination services.   Consent to Services:  The patient was given information about Chronic Care Management services, agreed to services, and gave verbal consent prior to initiation of services.  Please see initial visit note for detailed documentation.   Patient Care Team: Tullo, Teresa L, MD as PCP - General (Internal Medicine) ,  E, RPH-CPP (Pharmacist)  Recent office visits: 05/24/20- PCP visit, A1c. 5.9%, B12 normal, eGFR 47 (Feb); concern for falls risk  Recent consult visits: None recently  Hospital visits: None in previous 6 months  Objective:  Lab Results  Component Value Date   CREATININE 1.15 02/15/2020   CREATININE 1.10 11/11/2018   CREATININE 1.27 (H) 06/10/2018    Lab Results  Component Value Date   HGBA1C 5.9 02/15/2020   Last diabetic Eye exam:  Lab Results  Component Value Date/Time   HMDIABEYEEXA No Retinopathy 12/16/2012 12:00 AM    Last diabetic Foot exam:  Lab Results  Component Value Date/Time   HMDIABFOOTEX normal 01/19/2014 12:00 AM        Component Value Date/Time   CHOL 178 02/15/2020 1503   TRIG 97.0 02/15/2020 1503   HDL 73.00 02/15/2020 1503   CHOLHDL 2 02/15/2020 1503   VLDL 19.4 02/15/2020 1503   LDLCALC 86 02/15/2020 1503   LDLDIRECT 105.0 05/09/2016 1047    Hepatic Function Latest Ref Rng & Units 02/15/2020 05/31/2019 11/11/2018  Total Protein 6.0 - 8.3 g/dL 6.1 6.0 6.1  Albumin 3.5 - 5.2 g/dL 4.0 4.1 3.9  AST 0 - 37 U/L 20 15 18  ALT 0 - 35 U/L 16 15 15  Alk Phosphatase  39 - 117 U/L 79 133(H) 69  Total Bilirubin 0.2 - 1.2 mg/dL 0.5 0.5 0.4  Bilirubin, Direct 0.0 - 0.3 mg/dL - 0.1 -    Lab Results  Component Value Date/Time   TSH 1.88 11/11/2018 02:06 PM   TSH 3.20 08/18/2017 09:53 AM    CBC Latest Ref Rng & Units 02/15/2020 06/02/2018 05/12/2017  WBC 4.0 - 10.5 K/uL 5.1 6.3 10.5  Hemoglobin 12.0 - 15.0 g/dL 13.0 12.9 12.6  Hematocrit 36.0 - 46.0 % 38.7 38.6 37.2  Platelets 150.0 - 400.0 K/uL 265.0 243.0 351.0    Lab Results  Component Value Date/Time   VD25OH 42.74 02/15/2020 03:03 PM   VD25OH 57.24 05/31/2019 09:25 AM    Clinical ASCVD: No  The 10-year ASCVD risk score (Goff DC Jr., et al., 2013) is: 36.7%   Values used to calculate the score:     Age: 74 years     Sex: Female     Is Non-Hispanic African American: No     Diabetic: Yes     Tobacco smoker: No     Systolic Blood Pressure: 146 mmHg     Is BP treated: Yes     HDL Cholesterol: 73 mg/dL     Total Cholesterol: 178 mg/dL     Social History   Tobacco Use  Smoking Status Never Smoker  Smokeless Tobacco Never Used   BP Readings from Last   3 Encounters:  05/24/20 (!) 146/70  02/15/20 (!) 160/88  05/31/19 120/74   Pulse Readings from Last 3 Encounters:  05/24/20 73  02/15/20 92  05/31/19 85   Wt Readings from Last 3 Encounters:  05/24/20 195 lb 3.2 oz (88.5 kg)  02/15/20 184 lb (83.5 kg)  06/25/19 194 lb (88 kg)    Assessment: Review of patient past medical history, allergies, medications, health status, including review of consultants reports, laboratory and other test data, was performed as part of comprehensive evaluation and provision of chronic care management services.   SDOH:  (Social Determinants of Health) assessments and interventions performed:  SDOH Interventions   Flowsheet Row Most Recent Value  SDOH Interventions   Financial Strain Interventions Other (Comment)  [referral ro RN CM to support evaluation of insurance benefits]      CCM Care  Plan  Allergies  Allergen Reactions  . Morphine And Related Nausea And Vomiting    Medications Reviewed Today    Reviewed by De Hollingshead, RPH-CPP (Pharmacist) on 05/25/20 at 87  Med List Status: <None>  Medication Order Taking? Sig Documenting Provider Last Dose Status Informant  amitriptyline (ELAVIL) 50 MG tablet [id number] Yes TAKE 1 TABLET BY MOUTH EVERYDAY AT BEDTIME [name], MD Taking Active   atorvastatin (LIPITOR) 10 MG tablet [id number] Yes Take 1 tablet (10 mg total) by mouth daily. [name], MD Taking Active   buPROPion [facility] SR) 150 MG 12 hr tablet [id number] Yes TAKE 1 TABLET BY MOUTH TWICE A DAY [name], MD Taking Active   clonazePAM (KLONOPIN) 0.5 MG tablet [id number] Yes TAKE 1/2 TABLET BY MOUTH TWICE DAILY AS NEEDED [name], MD Taking Active   fluticasone (FLONASE) 50 MCG/ACT nasal spray [id number] Yes SPRAY 2 SPRAYS INTO EACH NOSTRIL EVERY DAY [name], MD Taking Active   losartan-hydrochlorothiazide [facility]) 50-12.5 MG tablet [id number] Yes Take 1 tablet by mouth daily. [name], MD Taking Active   omeprazole (PRILOSEC) 20 MG capsule [id number] Yes TAKE 1 CAPSULE BY MOUTH TWICE A DAY BEFORE MEALS [name], MD Taking Active   sertraline (ZOLOFT) 100 MG tablet [id number] Yes TAKE 1 TABLET BY MOUTH EVERY DAY [name], MD Taking Active   traMADol (ULTRAM) 50 MG tablet [id number] Yes TAKE 2 TABLETS BY MOUTH EVERY 8 HOURS AS NEEDED FOR PAIN [name], MD Taking Active   traZODone (DESYREL) 100 MG tablet [id number] Yes TAKE 1 TABLET BY MOUTH TWICE A DAY [name], MD Taking Active           Patient Active Problem List   Diagnosis Date Noted  . Hypertension associated with diabetes ([place]) 02/16/2020  . History of total left hip replacement 02/16/2020  . Atherosclerotic cerebrovascular disease 02/16/2020  . Trimalleolar fracture of ankle, closed, left, sequela 05/31/2019  . Frequent falls 05/31/2018   . Arrhythmia 05/09/2016  . Degenerative arthritis of carpometacarpal joint of thumb 07/28/2015  . Encounter for preventive health examination 04/29/2015  . Grief reaction 02/12/2015  . DJD (degenerative joint disease) of knee 07/03/2014  . Obesity 01/22/2014  . OSA (obstructive sleep apnea) 01/19/2014  . Anxiety   . History of gastric bypass 10/30/2011  . Personal history of diabetes mellitus 10/30/2011    Immunization History  Administered Date(s) Administered  . Fluad Quad(high Dose 65+) 11/11/2018, 10/01/2019  . Influenza, High Dose Seasonal PF 12/08/2012, 02/10/2015, 02/07/2016, 11/11/2016, 11/19/2017  . Pneumococcal Conjugate-13 12/08/2012  . Pneumococcal Polysaccharide-23 10/22/2008, 02/10/2015  .  Zoster 12/08/2009    Conditions to be addressed/monitored: HTN, HLD, Anxiety and Depression  Care Plan : Medication Management  Updates made by De Hollingshead, RPH-CPP since 05/25/2020 12:00 AM    Problem: Falls Risk Reduction, Depression, Anxiety, HLD     Long-Range Goal: Disease Progression Prevention   Start Date: 05/25/2020  This Visit's Progress: On track  Priority: High  Note:   Current Barriers:  . Suboptimal therapeutic regimen for depression, anxiety, pain, given elevated risk of falls  Pharmacist Clinical Goal(s):  Marland Kitchen Over the next 90 days, patient will achieve adherence to monitoring guidelines and medication adherence to achieve therapeutic efficacy through collaboration with PharmD and provider.   Interventions: . 1:1 collaboration with [name], MD regarding development and update of comprehensive plan of care as evidenced by provider attestation and co-signature . Inter-disciplinary care team collaboration (see longitudinal plan of care) . Comprehensive medication review performed; medication list updated in electronic medical record  Health Maintenance: . Would like to transfer medications to mail order. She lost the phone number for [facility]  mail order. Advised to contact customer service number on the back of her card to set up profile w/ [facility] and discuss having chronic meds transferred. Discussed that she likely will need to continue to get tramadol and clonazepam from local pharmacy.   Hypertension: . Uncontrolled per last clinic visit; current treatment: losartan/HCTZ 50/12.5 mg QAM;  . Current home readings: reports that her meter "runs high" as compared to a home insurance nurse check. Previously bought the meter for $10 at a [employer], clinical (histocompatibility and immunogenetics). Notes that she has $40 in OTC benefits, but phone number is rubbed off . Reviewed goal BP <140/90, lenient goal given falls risk . Advised to contact customer service number on the back of her card to discuss where to call for OTC benefits. Recommend acquiring new arm BP cuff. . Will collaborate w/ RN CM for disease management education and support . Recommend to continue current regimen at this time  Hyperlipidemia: . Uncontrolled; current treatment: planning to start atorvastatin, has not picked up from the pharmacy yet  . Baseline LDL 86, 10 year ASCVD risk 36.7% . Recommend to pick up medication and start as prescribed by PCP yesterday. Will follow for side effects moving forward.   Depression/Anxiety . Uncontrolled and suboptimally managed; current treatment: sertraline 100 mg daily, clonazepam 0.25 mg BID PRN - taking BID daily, trazodone 100 mg BID, amitriptyline 50 mg QPM, bupropion SR 150 mg BID - taking second dose in the evening . Reports family stress related to son, granddaughter, daughter. Notes her "nerves" require clonazepam BID. Unsure how long she has been on sertraline.   Liana Gerold referral to LCSW for mental health support. Patient amenable.  . Discussed risks of multiple CNS sedating medications and falls risk, particularly in combination with opioid therapy. Patient already notes that she is "tired and just fatigued" throughout the day. Notes that she  feels she can't walk in a straight line. Bupropion can be more activating, impacting anxiety and sleep. Recommend taking second dose of bupropion in the early afternoon to prevent contribution to insomnia, especially given need for amitriptyline and trazodone BID. Alternatively, consider change to ER formulation to be taken once daily, reducing need for timing second dose appropriately to prevent impact on sleep.  . Consider swapping sertraline for duloxetine moving forward to target OA pain as well, as patient reports she has been on sertraline for several years. Per chart review, sertraline has  been charted since 2012.  Marland Kitchen Continue to encourage non-pharmacological methods of stress reduction to reduce need for clonazepam given falls risk, increased risk of opioid overdose.   OA Pain, Hx Ankle Fracture 03/2019: . Uncontrolled and suboptimally managed; current treatment; tramadol 50-100 mg Q8H PRN - patient taking 100 mg BID consistently. Reports OTC use of ibuprofen.  . Extensive discussion regarding risks of opioid related overdose in combination with benzodiazepine.  . Counseled on nephrotoxic impact of NSAIDs, advised to STOP ibuprofen. . Discussed use of around the clock acetaminophen to hopefully reduce need for tramadol. Advised Tylenol arthritis 650 mg 2-3 times daily, regularly. Also advised addition of OTC Voltaren gel 2-3 times daily to knee, hand, elbow, shoulder joints. Discussed that reducing tramadol through use of other agents would be beneficial for her long term health. Also consider duloxetine as above. Will discuss w/ PCP.  GERD: . Controlled but potentially suboptimally managed; current regimen: omeprazole 20 mg BID . Patient does not remember ever trying omeprazole once daily. No documented hx gastritis, Barrett's. Appears bariatric clinic had advised her to increase omeprazole to BID dosing several years ago given increased reflux symptoms with regular NSAID dosing.  . Moving  forward, consider trial of once daily PPI for more appropriate treatment.   Patient Goals/Self-Care Activities . Over the next 90 days, patient will:  - take medications as prescribed collaborate with provider on medication access solutions  Follow Up Plan: Telephone follow up appointment with care management team member scheduled for: ~ 8 weeks       Medication Assistance: None required.  Patient affirms current coverage meets needs.  Patient's preferred pharmacy is:  [id number] [address] [address]Phone: [phone]: [phone] [id number]Phone: [phone]: [phone] [place] [address]Phone: [phone]: [phone]  Follow Up:  Patient agrees to Care Plan and Follow-up.  Plan: Telephone follow up appointment with care management team member scheduled for:  ~ 8 weeks  Catie TDarnelle Maffucci PharmD, BAn[place]ort[place] CWatervilleClinical Pharmacist LOccidental Petroleumat BJohnson & [phone]

## 2020-05-27 NOTE — Assessment & Plan Note (Signed)
She remains reluctant to return to independent living du to fear of falling but her ankle has healed

## 2020-05-27 NOTE — Assessment & Plan Note (Signed)
Vicodin has been discontinued due to lack of motivation to exercise.  Continue tramadol, refills given

## 2020-05-27 NOTE — Assessment & Plan Note (Addendum)
I have encouraged her to reduce her  BMI  with goal of 10% of body weigh over the next 6 months using a low glycemic index diet and regular exercise a minimum of 5 days per week.  ? ?

## 2020-05-27 NOTE — Assessment & Plan Note (Addendum)
Improved with addition of losartan given history of diabetes.  Lab Results  Component Value Date   MICROALBUR <0.7 02/15/2020   MICROALBUR <0.7 11/11/2018    Lab Results  Component Value Date   CREATININE 1.15 02/15/2020   Lab Results  Component Value Date   NA 140 02/15/2020   K 4.1 02/15/2020   CL 101 02/15/2020   CO2 31 02/15/2020

## 2020-05-27 NOTE — Assessment & Plan Note (Addendum)
Noted on head CT done in March 2021.Marland Kitchen  She has no history of CAD or DM currently and her untreated  LDL is < 100 on dose of atorvastatin .  No changes today  Lab Results  Component Value Date   CHOL 178 02/15/2020   HDL 73.00 02/15/2020   LDLCALC 86 02/15/2020   LDLDIRECT 105.0 05/09/2016   TRIG 97.0 02/15/2020   CHOLHDL 2 02/15/2020

## 2020-05-29 ENCOUNTER — Telehealth: Payer: Self-pay

## 2020-05-29 NOTE — Chronic Care Management (AMB) (Signed)
  Chronic Care Management   Note  05/29/2020 Name: Jodi [name] MRN: [id number] DOB: 1945-05-19  Jodi [name] is a 75 y.o. year old female who is a primary care patient of Derrel Nip, Aris Everts, MD. Jodi [name] is currently enrolled in care management services. An additional referral for LCSW and RN CM was placed.   Follow up plan: Unsuccessful telephone outreach attempt made. A HIPAA compliant phone message was left for the patient providing contact information and requesting a return call.  The care management team will reach out to the patient again over the next 5 days.  If patient returns call to provider office, please advise to call [place]  at [place], [place], [place], [place] [address] Direct Dial: [phone] [email or web address] Website: [email or web address]

## 2020-05-31 NOTE — Progress Notes (Signed)
No this was not the 3rd attempt

## 2020-05-31 NOTE — Progress Notes (Signed)
[name]   I have reached out again to this patient and was unsuccessful. Do you want me to cancel the time that you scheduled her for tomorrow?  Thanks  Noreene Larsson, [place], [place], [place] [address] Direct Dial: [phone] [name] Website: [email or web address]

## 2020-05-31 NOTE — Chronic Care Management (AMB) (Signed)
  Care Management   Note  05/31/2020 Name: [name] MRN: [id number] DOB: 01/21/1945  Jodi [name] is a 75 y.o. year old female who is a primary care patient of Derrel Nip, Aris Everts, MD and is actively engaged with the care management team. I reached out to Jodi [name] by phone today to assist with re-scheduling an initial visit with the RN Case Manager Licensed Clinical Social Worker  Follow up plan: Unsuccessful telephone outreach attempt made. A HIPAA compliant phone message was left for the patient providing contact information and requesting a return call.  The care management team will reach out to the patient again over the next 7 days.  If patient returns call to provider office, please advise to call [place]  at [place], [place], [place], [place] [address] Direct Dial: [phone] [email or web address] Website: [email or web address]

## 2020-06-01 ENCOUNTER — Telehealth: Payer: Medicare Other

## 2020-06-02 ENCOUNTER — Ambulatory Visit: Payer: Medicare Other

## 2020-06-02 ENCOUNTER — Telehealth: Payer: Self-pay

## 2020-06-02 NOTE — Telephone Encounter (Signed)
Patient was a no show for awv in office visit. Please reschedule as appropriate.

## 2020-06-03 ENCOUNTER — Other Ambulatory Visit: Payer: Self-pay | Admitting: Internal Medicine

## 2020-06-06 NOTE — Chronic Care Management (AMB) (Signed)
  Care Management   Note  06/06/2020 Name: [name] MRN: [id number] DOB: January 08, 1945  Jodi [name] is a 75 y.o. year old female who is a primary care patient of Derrel Nip, Aris Everts, MD and is actively engaged with the care management team. I reached out to Jodi [name] by phone today to assist with scheduling an initial visit with the RN Case Manager Licensed Clinical Social Worker  Follow up plan: Telephone appointment with care management team member scheduled for:  RN CM 06/09/2020 LCSW 06/15/2020  Noreene Larsson, [place], [place], [place] [address] Direct Dial: [phone] [email or web address] Website: [email or web address]

## 2020-06-09 ENCOUNTER — Ambulatory Visit (INDEPENDENT_AMBULATORY_CARE_PROVIDER_SITE_OTHER): Payer: Medicare Other | Admitting: *Deleted

## 2020-06-09 DIAGNOSIS — E1159 Type 2 diabetes mellitus with other circulatory complications: Secondary | ICD-10-CM | POA: Diagnosis not present

## 2020-06-09 DIAGNOSIS — I152 Hypertension secondary to endocrine disorders: Secondary | ICD-10-CM

## 2020-06-09 DIAGNOSIS — R296 Repeated falls: Secondary | ICD-10-CM

## 2020-06-09 NOTE — Patient Instructions (Addendum)
Visit Information   PATIENT GOALS:  Goals Addressed            This Visit's Progress   . (RNCM) Prevent Falls and Injury       Timeframe:  Long-Range Goal Priority:  High Start Date:  06/09/20                           Expected End Date: 12/06/20                       Follow Up Date 06/30/20    . Always use handrails on the stairs . Always wear low-heeled or flat shoes or slippers with nonskid soles . Call the doctor if I am feeling too drowsy . Make an emergency alert plan in case I fall and learn how to get back up if I fall . Pick up clutter from the floors . Use a nonslip pad with throw rugs, or remove them completely . Use a walker with all ambulation . Change positions slowly   Why is this important?    Most falls happen when it is hard for you to walk safely. Your balance may be off because of an illness. You may have pain in your knees, hip or other joints.   You may be overly tired or taking medicines that make you sleepy. You may not be able to see or hear clearly.   Falls can lead to broken bones, bruises or other injuries.   There are things you can do to help prevent falling.     Notes:     Marland Kitchen (RNCM) Track and Manage My Blood Pressure-Hypertension       Timeframe:  Long-Range Goal Priority:  Medium Start Date: 06/09/20                            Expected End Date:  12/06/20                     Follow Up Date 06/30/20    . Check blood pressure 3 times per week . Write blood pressure results in a log and take log to medical appointments for provider review . Eat more fresh fruits and vegetables, decreasing canned foods . Low salt diet   Why is this important?    You won't feel high blood pressure, but it can still hurt your blood vessels.   High blood pressure can cause heart or kidney problems. It can also cause a stroke.   Making lifestyle changes like losing a little weight or eating less salt will help.   Checking your blood pressure at home and at  different times of the day can help to control blood pressure.   If the doctor prescribes medicine remember to take it the way the doctor ordered.   Call the office if you cannot afford the medicine or if there are questions about it.     Notes:        Consent to CCM Services: Ms. Rigdon was given information about Chronic Care Management services today including:  1. CCM service includes personalized support from designated clinical staff supervised by her physician, including individualized plan of care and coordination with other care providers 2. 24/7 contact phone numbers for assistance for urgent and routine care needs. 3. Service will only be billed when office clinical staff spend 20 minutes or more  in a month to coordinate care. 4. Only one practitioner may furnish and bill the service in a calendar month. 5. The patient may stop CCM services at any time (effective at the end of the month) by phone call to the office staff. 6. The patient will be responsible for cost sharing (co-pay) of up to 20% of the service fee (after annual deductible is met).  Patient agreed to services and verbal consent obtained.   The patient verbalized understanding of instructions, educational materials, and care plan provided today and declined offer to receive copy of patient instructions, educational materials, and care plan.   The care management team will reach out to the patient again over the next 30 business days.   Hubert Azure RN, MSN RN Care Management Coordinator [place] [phone] [email or web address]   CLINICAL CARE PLAN: Patient Care Plan: Fall Risk (Adult)  Problem Identified: Fall Risk   Priority: High  Long-Range Goal: Absence of Fall and Fall-Related Injury   Start Date: 06/09/2020  Expected End Date: 12/06/2020  Priority: High  Current Barriers:  Marland Kitchen Knowledge Deficits related to fall precautions in patient with frequent falls at  home.  Patient reporting >20 falls in the past year.  States she trips over things frequently.  Last fall was yesterday, tripping over a pair of boots.  States she is just a little sore all over, denies injury but did not seek medical attention.  Pain is controlled with Tylenol arthritis.  Fall last year resulting in fractured ankle.  Since then patient has been staying with her son and granddaughter.  Has cane and walker but does not use them; states she just holds onto furniture.  Fall precautions and preventions reviewed and patient encouraged to use walker with all ambulation. . Decreased adherence to prescribed treatment for fall prevention . Knowledge Deficits related to self care management of preventing falls Clinical Goal(s):  . patient will demonstrate improved adherence to prescribed treatment plan for decreasing falls as evidenced by patient reporting and review of EMR . patient will verbalize using fall risk reduction strategies discussed . patient will not experience additional falls . patient will verbalize understanding of plan for fall prevention Interventions:  . Collaboration with [name], MD regarding development and update of comprehensive plan of care as evidenced by provider attestation and co-signature . Inter-disciplinary care team collaboration (see longitudinal plan of care) . Provided written and verbal education re: Potential causes of falls and Fall prevention strategies . Reviewed medications and discussed potential side effects of medications such as dizziness and frequent urination; discussed taking medications only as prescribed . Assessed for falls since last encounter. . Assessed patients knowledge of fall risk prevention secondary to previously provided education. . Provided education to patient and/or caregiver about advanced directives . Reviewed scheduled/upcoming provider appointments including: encouraged to reschedule missed Annual Wellness  Visit . Discussed plans with patient for ongoing care management follow up and provided patient with direct contact information for care management team . Activities of daily living skills assessed . Assistive or adaptive device use encouraged . Barriers to physical activity or exercise identified and barriers to physical activity or exercise addressed . Barriers to safety identified . Cognition assessed and cognitive-stimulating activities promoted (puzzles) . Fall prevention plan reviewed and updated . Fear of falling, loss of independence and pain acknowledged . Modification of home and work environment promoted; Leisure centre manager, discussed removing rugs and other trip hazards . Vision and/or hearing aid use promoted . Instructed and  encouraged the patient to utilize walker appropriately with all ambulation . Discussed and encouraged to change positions slowly . Encouraged to wear secure fitting shoes at all times with ambulation Patient Goals/Self-Care Deficits:   . Always use handrails on the stairs . Always wear low-heeled or flat shoes or slippers with nonskid soles . Call the doctor if I am feeling too drowsy . Make an emergency alert plan in case I fall and learn how to get back up if I fall . Pick up clutter from the floors . Use a nonslip pad with throw rugs, or remove them completely . Use a walker with all ambulation . Change positions slowly Follow Up Plan: The care management team will reach out to the patient again over the next 30 business days.    Patient Care Plan: Hypertension (Adult)  Problem Identified: Hypertension (Hypertension)   Priority: Medium  Long-Range Goal: Hypertension Monitored   Start Date: 06/09/2020  Expected End Date: 12/06/2020  Priority: Medium  Objective:  . Last practice recorded BP readings:  BP Readings from Last 3 Encounters:  05/24/20 (!) 146/70  02/15/20 (!) 160/88  05/31/19 120/74  Current Barriers:  Marland Kitchen Knowledge Deficits related  to basic understanding of hypertension pathophysiology and self care management; reports monitoring blood pressure at home but is not sure of the accuracy of home machine.  Last blood pressure checked at home was 142/79.  States when [facility] nurse came to visit SBP was 118.  Denies any episodes of hypotension. Case Manager Clinical Goal(s):  . patient will verbalize understanding of plan for hypertension management . patient will demonstrate improved adherence to prescribed treatment plan for hypertension as evidenced by taking all medications as prescribed, monitoring and recording blood pressure as directed, adhering to low sodium/DASH diet Interventions:  . Collaboration with [name], MD regarding development and update of comprehensive plan of care as evidenced by provider attestation and co-signature . Inter-disciplinary care team collaboration (see longitudinal plan of care) . Evaluation of current treatment plan related to hypertension self management and patient's adherence to plan as established by provider. . Provided education to patient re: stroke prevention, s/s of heart attack and stroke, DASH diet, complications of uncontrolled blood pressure . Reviewed medications with patient and discussed importance of compliance . Discussed plans with patient for ongoing care management follow up and provided patient with direct contact information for care management team . Advised patient, providing education and rationale, to monitor blood pressure at least 3 times a week and record, calling PCP for findings outside established parameters.  . Blood pressure trends reviewed . Depression screen reviewed . Home blood pressure monitoring encouraged to check blood pressure 3 times per week; write blood pressure results in a log for provider review . Verified patient has home blood pressure monitor and encouraged to take to next medical appointment for comparison . Encouraged to attend all  scheduled provider appointments . Encouraged to call provider office for new concerns, questions, or BP outside discussed parameters . Discussed and encouraged to follow a low sodium diet/DASH diet; eating more fresh fruits and vegetables and decreasing canned vegetables/fruits Patient Goals/Self-Care Activities: . Check blood pressure 3 times per week . Write blood pressure results in a log and take log to medical appointments for provider review . Eat more fresh or frozen fruits and vegetables, decreasing canned foods . Low salt diet Follow Up Plan: The care management team will reach out to the patient again over the next 30 business days.  Fall Prevention in the Home, Adult Falls can cause injuries and can affect people from all age groups. There are many simple things that you can do to make your home safe and to help prevent falls. Ask for help when making these changes, if needed. What actions can I take to prevent falls? General instructions  Use good lighting in all rooms. Replace any light bulbs that burn out.  Turn on lights if it is dark. Use night-lights.  Place frequently used items in easy-to-reach places. Lower the shelves around your home if necessary.  Set up furniture so that there are clear paths around it. Avoid moving your furniture around.  Remove throw rugs and other tripping hazards from the floor.  Avoid walking on wet floors.  Fix any uneven floor surfaces.  Add color or contrast paint or tape to grab bars and handrails in your home. Place contrasting color strips on the first and last steps of stairways.  When you use a stepladder, make sure that it is completely opened and that the sides are firmly locked. Have someone hold the ladder while you are using it. Do not climb a closed stepladder.  Be aware of any and all pets. What can I do in the bathroom?  Keep the floor dry. Immediately clean up any water that spills onto the floor.  Remove  soap buildup in the tub or shower on a regular basis.  Use non-skid mats or decals on the floor of the tub or shower.  Attach bath mats securely with double-sided, non-slip rug tape.  If you need to sit down while you are in the shower, use a plastic, non-slip stool.  Install grab bars by the toilet and in the tub and shower. Do not use towel bars as grab bars.      What can I do in the bedroom?  Make sure that a bedside light is easy to reach.  Do not use oversized bedding that drapes onto the floor.  Have a firm chair that has side arms to use for getting dressed. What can I do in the kitchen?  Clean up any spills right away.  If you need to reach for something above you, use a sturdy step stool that has a grab bar.  Keep electrical cables out of the way.  Do not use floor polish or wax that makes floors slippery. If you must use wax, make sure that it is non-skid floor wax. What can I do in the stairways?  Do not leave any items on the stairs.  Make sure that you have a light switch at the top of the stairs and the bottom of the stairs. Have them installed if you do not have them.  Make sure that there are handrails on both sides of the stairs. Fix handrails that are broken or loose. Make sure that handrails are as long as the stairways.  Install non-slip stair treads on all stairs in your home.  Avoid having throw rugs at the top or bottom of stairways, or secure the rugs with carpet tape to prevent them from moving.  Choose a carpet design that does not hide the edge of steps on the stairway.  Check any carpeting to make sure that it is firmly attached to the stairs. Fix any carpet that is loose or worn. What can I do on the outside of my home?  Use bright outdoor lighting.  Regularly repair the edges of walkways and driveways and fix any cracks.  Remove high doorway thresholds.  Trim any shrubbery on the main path into your home.  Regularly check that  handrails are securely fastened and in good repair. Both sides of any steps should have handrails.  Install guardrails along the edges of any raised decks or porches.  Clear walkways of debris and clutter, including tools and rocks.  Have leaves, snow, and ice cleared regularly.  Use sand or salt on walkways during winter months.  In the garage, clean up any spills right away, including grease or oil spills. What other actions can I take?  Wear closed-toe shoes that fit well and support your feet. Wear shoes that have rubber soles or low heels.  Use mobility aids as needed, such as canes, walkers, scooters, and crutches.  Review your medicines with your health care provider. Some medicines can cause dizziness or changes in blood pressure, which increase your risk of falling. Talk with your health care provider about other ways that you can decrease your risk of falls. This may include working with a physical therapist or trainer to improve your strength, balance, and endurance. Where to find more information  Centers for Disease Control and Prevention, [employer]AD[employer]: WebmailGuide.co.za  [employer] on Aging: BrainJudge.co.uk Contact a health care provider if:  You are afraid of falling at home.  You feel weak, drowsy, or dizzy at home.  You fall at home. Summary  There are many simple things that you can do to make your home safe and to help prevent falls.  Ways to make your home safe include removing tripping hazards and installing grab bars in the bathroom.  Ask for help when making these changes in your home. This information is not intended to replace advice given to you by your health care provider. Make sure you discuss any questions you have with your health care provider. Document Revised: 12/06/2016 Document Reviewed: 08/08/2016 Elsevier Patient Education  2021 [place] Prevention in the Home, Adult Falls can cause injuries and can affect  people from all age groups. There are many simple things that you can do to make your home safe and to help prevent falls. Ask for help when making these changes, if needed. What actions can I take to prevent falls? General instructions  Use good lighting in all rooms. Replace any light bulbs that burn out.  Turn on lights if it is dark. Use night-lights.  Place frequently used items in easy-to-reach places. Lower the shelves around your home if necessary.  Set up furniture so that there are clear paths around it. Avoid moving your furniture around.  Remove throw rugs and other tripping hazards from the floor.  Avoid walking on wet floors.  Fix any uneven floor surfaces.  Add color or contrast paint or tape to grab bars and handrails in your home. Place contrasting color strips on the first and last steps of stairways.  When you use a stepladder, make sure that it is completely opened and that the sides are firmly locked. Have someone hold the ladder while you are using it. Do not climb a closed stepladder.  Be aware of any and all pets. What can I do in the bathroom?  Keep the floor dry. Immediately clean up any water that spills onto the floor.  Remove soap buildup in the tub or shower on a regular basis.  Use non-skid mats or decals on the floor of the tub or shower.  Attach bath mats securely with double-sided, non-slip rug tape.  If you need to sit down while you are in the shower, use a plastic, non-slip stool.  Install grab bars by the toilet and in the tub and shower. Do not use towel bars as grab bars.      What can I do in the bedroom?  Make sure that a bedside light is easy to reach.  Do not use oversized bedding that drapes onto the floor.  Have a firm chair that has side arms to use for getting dressed. What can I do in the kitchen?  Clean up any spills right away.  If you need to reach for something above you, use a sturdy step stool that has a grab  bar.  Keep electrical cables out of the way.  Do not use floor polish or wax that makes floors slippery. If you must use wax, make sure that it is non-skid floor wax. What can I do in the stairways?  Do not leave any items on the stairs.  Make sure that you have a light switch at the top of the stairs and the bottom of the stairs. Have them installed if you do not have them.  Make sure that there are handrails on both sides of the stairs. Fix handrails that are broken or loose. Make sure that handrails are as long as the stairways.  Install non-slip stair treads on all stairs in your home.  Avoid having throw rugs at the top or bottom of stairways, or secure the rugs with carpet tape to prevent them from moving.  Choose a carpet design that does not hide the edge of steps on the stairway.  Check any carpeting to make sure that it is firmly attached to the stairs. Fix any carpet that is loose or worn. What can I do on the outside of my home?  Use bright outdoor lighting.  Regularly repair the edges of walkways and driveways and fix any cracks.  Remove high doorway thresholds.  Trim any shrubbery on the main path into your home.  Regularly check that handrails are securely fastened and in good repair. Both sides of any steps should have handrails.  Install guardrails along the edges of any raised decks or porches.  Clear walkways of debris and clutter, including tools and rocks.  Have leaves, snow, and ice cleared regularly.  Use sand or salt on walkways during winter months.  In the garage, clean up any spills right away, including grease or oil spills. What other actions can I take?  Wear closed-toe shoes that fit well and support your feet. Wear shoes that have rubber soles or low heels.  Use mobility aids as needed, such as canes, walkers, scooters, and crutches.  Review your medicines with your health care provider. Some medicines can cause dizziness or changes in  blood pressure, which increase your risk of falling. Talk with your health care provider about other ways that you can decrease your risk of falls. This may include working with a physical therapist or trainer to improve your strength, balance, and endurance. Where to find more information  Centers for Disease Control and Prevention, [employer]: WebmailGuide.co.za  [employer] on Aging: BrainJudge.co.uk Contact a health care provider if:  You are afraid of falling at home.  You feel weak, drowsy, or dizzy at home.  You fall at home. Summary  There are many simple things that you can do to make your home safe and to help prevent falls.  Ways to make your home safe include  removing tripping hazards and installing grab bars in the bathroom.  Ask for help when making these changes in your home. This information is not intended to replace advice given to you by your health care provider. Make sure you discuss any questions you have with your health care provider. Document Revised: 12/06/2016 Document Reviewed: 08/08/2016 Elsevier Patient Education  2021 [employer].

## 2020-06-09 NOTE — Chronic Care Management (AMB) (Signed)
Chronic Care Management   CCM RN Visit Note  06/09/2020 Name: Jodi [name] MRN: [id number] DOB: 1945/07/16  Subjective: Jodi [name] is a 75 y.o. year old female who is a primary care patient of Derrel Nip, Aris Everts, MD. The care management team was consulted for assistance with disease management and care coordination needs.    Engaged with patient by telephone for initial visit in response to provider referral for case management and/or care coordination services.   Consent to Services:  The patient was given the following information about Chronic Care Management services today, agreed to services, and gave verbal consent: 1. CCM service includes personalized support from designated clinical staff supervised by the primary care provider, including individualized plan of care and coordination with other care providers 2. 24/7 contact phone numbers for assistance for urgent and routine care needs. 3. Service will only be billed when office clinical staff spend 20 minutes or more in a month to coordinate care. 4. Only one practitioner may furnish and bill the service in a calendar month. 5.The patient may stop CCM services at any time (effective at the end of the month) by phone call to the office staff. 6. The patient will be responsible for cost sharing (co-pay) of up to 20% of the service fee (after annual deductible is met). Patient agreed to services and consent obtained.  Patient agreed to services and verbal consent obtained.   Assessment: Review of patient past medical history, allergies, medications, health status, including review of consultants reports, laboratory and other test data, was performed as part of comprehensive evaluation and provision of chronic care management services.   SDOH (Social Determinants of Health) assessments and interventions performed:  SDOH Interventions   Flowsheet Row Most Recent Value  SDOH Interventions   Food Insecurity Interventions Intervention Not  Indicated  Financial Strain Interventions Intervention Not Indicated  Housing Interventions Intervention Not Indicated  Intimate Partner Violence Interventions Intervention Not Indicated  Transportation Interventions Intervention Not Indicated       CCM Care Plan  Allergies  Allergen Reactions  . Morphine And Related Nausea And Vomiting    Outpatient Encounter Medications as of 06/09/2020  Medication Sig  . acetaminophen (TYLENOL) 650 MG CR tablet Take 650 mg by mouth every 8 (eight) hours as needed for pain.  Marland Kitchen amitriptyline (ELAVIL) 50 MG tablet TAKE 1 TABLET BY MOUTH EVERYDAY AT BEDTIME  . atorvastatin (LIPITOR) 10 MG tablet Take 1 tablet (10 mg total) by mouth daily.  . B Complex-C-Folic Acid (EQL SUPER B COMPLEX/VITAMIN C PO) Take 1 tablet by mouth daily.  Marland Kitchen buPROPion (WELLBUTRIN SR) 150 MG 12 hr tablet TAKE 1 TABLET BY MOUTH TWICE A DAY  . clonazePAM (KLONOPIN) 0.5 MG tablet TAKE 1/2 TABLET BY MOUTH TWICE DAILY AS NEEDED  . fluticasone (FLONASE) 50 MCG/ACT nasal spray SPRAY 2 SPRAYS INTO EACH NOSTRIL EVERY DAY  . losartan-hydrochlorothiazide (HYZAAR) 50-12.5 MG tablet Take 1 tablet by mouth daily.  . Multiple Vitamins-Minerals (WOMENS ONE DAILY PO) Take 1 tablet by mouth daily.  Marland Kitchen omeprazole (PRILOSEC) 20 MG capsule TAKE 1 CAPSULE BY MOUTH TWICE A DAY BEFORE MEALS  . sertraline (ZOLOFT) 100 MG tablet TAKE 1 TABLET BY MOUTH EVERY DAY  . traMADol (ULTRAM) 50 MG tablet TAKE 2 TABLETS BY MOUTH EVERY 8 HOURS AS NEEDED FOR PAIN  . traZODone (DESYREL) 100 MG tablet TAKE 1 TABLET BY MOUTH TWICE A DAY   No facility-administered encounter medications on file as of 06/09/2020.    Patient  Active Problem List   Diagnosis Date Noted  . Hypertension associated with diabetes ([place]) 02/16/2020  . History of total left hip replacement 02/16/2020  . Atherosclerotic cerebrovascular disease 02/16/2020  . Trimalleolar fracture of ankle, closed, left, sequela 05/31/2019  . Frequent falls 05/31/2018   . Arrhythmia 05/09/2016  . Degenerative arthritis of carpometacarpal joint of thumb 07/28/2015  . Encounter for preventive health examination 04/29/2015  . Grief reaction 02/12/2015  . DJD (degenerative joint disease) of knee 07/03/2014  . Obesity 01/22/2014  . OSA (obstructive sleep apnea) 01/19/2014  . Anxiety   . History of gastric bypass 10/30/2011  . Personal history of diabetes mellitus 10/30/2011    Conditions to be addressed/monitored:HTN and Falls  Care Plan : Fall Risk (Adult)  Updates made by Leona Singleton, RN since 06/09/2020 12:00 AM  Problem: Fall Risk   Priority: High  Long-Range Goal: Absence of Fall and Fall-Related Injury   Start Date: 06/09/2020  Expected End Date: 12/06/2020  Priority: High  Current Barriers:  Marland Kitchen Knowledge Deficits related to fall precautions in patient with frequent falls at home.  Patient reporting >20 falls in the past year.  States she trips over things frequently.  Last fall was yesterday, tripping over a pair of boots.  States she is just a little sore all over, denies injury but did not seek medical attention.  Pain is controlled with Tylenol arthritis.  Fall last year resulting in fractured ankle.  Since then patient has been staying with her son and granddaughter.  Has cane and walker but does not use them; states she just holds onto furniture.  Fall precautions and preventions reviewed and patient encouraged to use walker with all ambulation. . Decreased adherence to prescribed treatment for fall prevention . Knowledge Deficits related to self care management of preventing falls Clinical Goal(s):  . patient will demonstrate improved adherence to prescribed treatment plan for decreasing falls as evidenced by patient reporting and review of EMR . patient will verbalize using fall risk reduction strategies discussed . patient will not experience additional falls . patient will verbalize understanding of plan for fall  prevention Interventions:  . Collaboration with [name], MD regarding development and update of comprehensive plan of care as evidenced by provider attestation and co-signature . Inter-disciplinary care team collaboration (see longitudinal plan of care) . Provided written and verbal education re: Potential causes of falls and Fall prevention strategies . Reviewed medications and discussed potential side effects of medications such as dizziness and frequent urination; discussed taking medications only as prescribed . Assessed for falls since last encounter. . Assessed patients knowledge of fall risk prevention secondary to previously provided education. . Provided education to patient and/or caregiver about advanced directives . Reviewed scheduled/upcoming provider appointments including: encouraged to reschedule missed Annual Wellness Visit . Discussed plans with patient for ongoing care management follow up and provided patient with direct contact information for care management team . Activities of daily living skills assessed . Assistive or adaptive device use encouraged . Barriers to physical activity or exercise identified and barriers to physical activity or exercise addressed . Barriers to safety identified . Cognition assessed and cognitive-stimulating activities promoted (puzzles) . Fall prevention plan reviewed and updated . Fear of falling, loss of independence and pain acknowledged . Modification of home and work environment promoted; Leisure centre manager, discussed removing rugs and other trip hazards . Vision and/or hearing aid use promoted . Instructed and encouraged the patient to utilize walker appropriately with all ambulation .  Discussed and encouraged to change positions slowly . Encouraged to wear secure fitting shoes at all times with ambulation Patient Goals/Self-Care Deficits:   . Always use handrails on the stairs . Always wear low-heeled or flat shoes or  slippers with nonskid soles . Call the doctor if I am feeling too drowsy . Make an emergency alert plan in case I fall and learn how to get back up if I fall . Pick up clutter from the floors . Use a nonslip pad with throw rugs, or remove them completely . Use a walker with all ambulation . Change positions slowly Follow Up Plan: The care management team will reach out to the patient again over the next 30 business days.    Care Plan : Hypertension (Adult)  Updates made by Leona Singleton, RN since 06/09/2020 12:00 AM  Problem: Hypertension (Hypertension)   Priority: Medium  Long-Range Goal: Hypertension Monitored   Start Date: 06/09/2020  Expected End Date: 12/06/2020  Priority: Medium  Objective:  . Last practice recorded BP readings:  BP Readings from Last 3 Encounters:  05/24/20 (!) 146/70  02/15/20 (!) 160/88  05/31/19 120/74  Current Barriers:  Marland Kitchen Knowledge Deficits related to basic understanding of hypertension pathophysiology and self care management; reports monitoring blood pressure at home but is not sure of the accuracy of home machine.  Last blood pressure checked at home was 142/79.  States when [facility] nurse came to visit SBP was 118.  Denies any episodes of hypotension. Case Manager Clinical Goal(s):  . patient will verbalize understanding of plan for hypertension management . patient will demonstrate improved adherence to prescribed treatment plan for hypertension as evidenced by taking all medications as prescribed, monitoring and recording blood pressure as directed, adhering to low sodium/DASH diet Interventions:  . Collaboration with [name], MD regarding development and update of comprehensive plan of care as evidenced by provider attestation and co-signature . Inter-disciplinary care team collaboration (see longitudinal plan of care) . Evaluation of current treatment plan related to hypertension self management and patient's adherence to plan as established by  provider. . Provided education to patient re: stroke prevention, s/s of heart attack and stroke, DASH diet, complications of uncontrolled blood pressure . Reviewed medications with patient and discussed importance of compliance . Discussed plans with patient for ongoing care management follow up and provided patient with direct contact information for care management team . Advised patient, providing education and rationale, to monitor blood pressure at least 3 times a week and record, calling PCP for findings outside established parameters.  . Blood pressure trends reviewed . Depression screen reviewed . Home blood pressure monitoring encouraged to check blood pressure 3 times per week; write blood pressure results in a log for provider review . Verified patient has home blood pressure monitor and encouraged to take to next medical appointment for comparison . Encouraged to attend all scheduled provider appointments . Encouraged to call provider office for new concerns, questions, or BP outside discussed parameters . Discussed and encouraged to follow a low sodium diet/DASH diet; eating more fresh fruits and vegetables and decreasing canned vegetables/fruits Patient Goals/Self-Care Activities: . Check blood pressure 3 times per week . Write blood pressure results in a log and take log to medical appointments for provider review . Eat more fresh or frozen fruits and vegetables, decreasing canned foods . Low salt diet Follow Up Plan: The care management team will reach out to the patient again over the next 30 business days.  Plan:The care management team will reach out to the patient again over the next 30 business days.  Hubert Azure RN, MSN RN Care Management Coordinator [place] [phone] [email or web address]

## 2020-06-14 ENCOUNTER — Other Ambulatory Visit: Payer: Self-pay

## 2020-06-14 ENCOUNTER — Other Ambulatory Visit (INDEPENDENT_AMBULATORY_CARE_PROVIDER_SITE_OTHER): Payer: Medicare Other

## 2020-06-14 DIAGNOSIS — E1159 Type 2 diabetes mellitus with other circulatory complications: Secondary | ICD-10-CM | POA: Diagnosis not present

## 2020-06-14 DIAGNOSIS — I152 Hypertension secondary to endocrine disorders: Secondary | ICD-10-CM

## 2020-06-14 LAB — COMPREHENSIVE METABOLIC PANEL
ALT: 17 U/L (ref 0–35)
AST: 20 U/L (ref 0–37)
Albumin: 4 g/dL (ref 3.5–5.2)
Alkaline Phosphatase: 94 U/L (ref 39–117)
BUN: 17 mg/dL (ref 6–23)
CO2: 33 mEq/L — ABNORMAL HIGH (ref 19–32)
Calcium: 9.1 mg/dL (ref 8.4–10.5)
Chloride: 103 mEq/L (ref 96–112)
Creatinine, Ser: 1.04 mg/dL (ref 0.40–1.20)
GFR: 53.27 mL/min — ABNORMAL LOW (ref >60.00)
Glucose, Bld: 97 mg/dL (ref 70–99)
Potassium: 4.1 mEq/L (ref 3.5–5.1)
Sodium: 141 mEq/L (ref 135–145)
Total Bilirubin: 0.6 mg/dL (ref 0.2–1.2)
Total Protein: 6 g/dL (ref 6.0–8.3)

## 2020-06-14 LAB — LIPID PANEL
Cholesterol: 184 mg/dL (ref 0–200)
HDL: 80.1 mg/dL (ref >39.00)
LDL Cholesterol: 72 mg/dL (ref 0–99)
NonHDL: 103.66
Total CHOL/HDL Ratio: 2
Triglycerides: 159 mg/dL — ABNORMAL HIGH (ref 0.0–149.0)
VLDL: 31.8 mg/dL (ref 0.0–40.0)

## 2020-06-14 LAB — HEMOGLOBIN A1C: Hgb A1c MFr Bld: 6 % (ref 4.6–6.5)

## 2020-06-15 ENCOUNTER — Telehealth: Payer: Medicare Other | Admitting: *Deleted

## 2020-06-15 ENCOUNTER — Telehealth: Payer: Medicare Other

## 2020-06-15 ENCOUNTER — Telehealth: Payer: Self-pay | Admitting: *Deleted

## 2020-06-15 NOTE — Telephone Encounter (Signed)
  Care Management   Follow Up Note   06/15/2020 Name: ALYSSABETH BRUSTER MRN: [id number] DOB: 1945-03-10   Referred by: [name], MD Reason for referral : Chronic Care Management in Patient with Frequent Falls, Hypertension, Degenerative Joint Disease of Knee, Degenerative Arthritis of Carpometacarpal Joint of Thumb, Obesity, Grief Reaction and Anxiety. Unsuccessful Initial Outreach Call Attempt.  The patient was referred to the case management team for assistance with care management and care coordination.  LCSW attempted to make initial contact with patient today to perform the phone assessment, as well as assess and assist with social work needs and services; however, patient was unavailable at the time of LCSW's calls.  Jabber and Spoofing Codes were utilized when making outreach calls to patient.  A HIPAA compliant message was left on voicemail for patient, and LCSW continues to await a return call.  LCSW will request that Scheduling Care Guides try and reschedule a second outreach attempt for LCSW with patient.  Follow Up Plan: The patient has been provided with contact information for the care management team and has been advised to call with any health related questions or concerns.   Nat Christen LCSW Licensed Clinical Social Worker [place]  [phone]

## 2020-06-19 ENCOUNTER — Telehealth: Payer: Self-pay

## 2020-06-19 NOTE — Telephone Encounter (Signed)
Left message to call back for lab results.

## 2020-06-20 ENCOUNTER — Telehealth: Payer: Self-pay | Admitting: Internal Medicine

## 2020-06-20 NOTE — Telephone Encounter (Signed)
Left message for patient to call back and schedule Medicare Annual Wellness Visit (AWV) in office.   If not able to come in office, please offer to do virtually or by telephone.   Last AWV:06/02/2019  Please schedule at anytime with Nurse Health Advisor.

## 2020-06-23 ENCOUNTER — Telehealth: Payer: Self-pay | Admitting: *Deleted

## 2020-06-23 NOTE — Chronic Care Management (AMB) (Signed)
  Care Management   Note  06/23/2020 Name: Jodi [name] MRN: [id number] DOB: 07-Mar-1945  Jodi [name] is a 75 y.o. year old female who is a primary care patient of Derrel Nip, Aris Everts, MD and is actively engaged with the care management team. I reached out to Jodi [name] by phone today to assist with re-scheduling a follow up visit with the Licensed Clinical Social Worker  Follow up plan: Unsuccessful 3rd telephone outreach attempt made. A HIPAA compliant phone message was left for the patient providing contact information and requesting a return call.  If patient returns call to provider office, please advise to call Embedded Care Management Care Guide Lidya Mccalister at [place] [place] [facility]  Direct Dial: [phone]

## 2020-06-30 ENCOUNTER — Ambulatory Visit: Payer: Medicare Other | Admitting: *Deleted

## 2020-06-30 DIAGNOSIS — S82852S Displaced trimalleolar fracture of left lower leg, sequela: Secondary | ICD-10-CM

## 2020-06-30 DIAGNOSIS — I152 Hypertension secondary to endocrine disorders: Secondary | ICD-10-CM | POA: Diagnosis not present

## 2020-06-30 DIAGNOSIS — R296 Repeated falls: Secondary | ICD-10-CM

## 2020-06-30 DIAGNOSIS — E1159 Type 2 diabetes mellitus with other circulatory complications: Secondary | ICD-10-CM | POA: Diagnosis not present

## 2020-06-30 NOTE — Patient Instructions (Signed)
Visit Information  PATIENT GOALS:  Goals Addressed             This Visit's Progress    (RNCM) Prevent Falls and Injury   On track    Timeframe:  Long-Range Goal Priority:  High Start Date:  06/09/20                           Expected End Date: 12/06/20                       Follow Up Date 07/24/20    Always use handrails on the stairs Always wear low-heeled or flat shoes or slippers with nonskid soles Call the doctor if I am feeling too drowsy Make an emergency alert plan in case I fall and learn how to get back up if I fall Pick up clutter from the floors Use a nonslip pad with throw rugs, or remove them completely Use a walker with all ambulation Change positions slowly Consider obtaining life alert   Why is this important?   Most falls happen when it is hard for you to walk safely. Your balance may be off because of an illness. You may have pain in your knees, hip or other joints.  You may be overly tired or taking medicines that make you sleepy. You may not be able to see or hear clearly.  Falls can lead to broken bones, bruises or other injuries.  There are things you can do to help prevent falling.     Notes:       (RNCM) Track and Manage My Blood Pressure-Hypertension   On track    Timeframe:  Long-Range Goal Priority:  Medium Start Date: 06/09/20                            Expected End Date:  12/06/20                     Follow Up Date 07/24/20    Check blood pressure 3 times per week Write blood pressure results in a log and take log to medical appointments for provider review Eat more fresh fruits and vegetables, decreasing canned foods Low salt diet Contact office to reschedule AWV appointment   Why is this important?   You won't feel high blood pressure, but it can still hurt your blood vessels.  High blood pressure can cause heart or kidney problems. It can also cause a stroke.  Making lifestyle changes like losing a little weight or eating less salt  will help.  Checking your blood pressure at home and at different times of the day can help to control blood pressure.  If the doctor prescribes medicine remember to take it the way the doctor ordered.  Call the office if you cannot afford the medicine or if there are questions about it.     Notes:          The patient verbalized understanding of instructions, educational materials, and care plan provided today and declined offer to receive copy of patient instructions, educational materials, and care plan.   The care management team will reach out to the patient again over the next 30 business days.   Hubert Azure RN, MSN RN Care Management Coordinator [place] [phone] [email or web address]

## 2020-06-30 NOTE — Chronic Care Management (AMB) (Signed)
Chronic Care Management   CCM RN Visit Note  06/30/2020 Name: Jodi [name] MRN: [id number] DOB: [birth year]  Subjective: Jodi [name] is a 75 y.o. year old female who is a primary care patient of Derrel Nip, Aris Everts, MD. The care management team was consulted for assistance with disease management and care coordination needs.    Engaged with patient by telephone for follow up visit in response to provider referral for case management and/or care coordination services.   Consent to Services:  The patient was given information about Chronic Care Management services, agreed to services, and gave verbal consent prior to initiation of services.  Please see initial visit note for detailed documentation.   Patient agreed to services and verbal consent obtained.   Assessment: Review of patient past medical history, allergies, medications, health status, including review of consultants reports, laboratory and other test data, was performed as part of comprehensive evaluation and provision of chronic care management services.   SDOH (Social Determinants of Health) assessments and interventions performed:    CCM Care Plan  Allergies  Allergen Reactions   Morphine And Related Nausea And Vomiting    Outpatient Encounter Medications as of 06/30/2020  Medication Sig   acetaminophen (TYLENOL) 650 MG CR tablet Take 650 mg by mouth every 8 (eight) hours as needed for pain.   amitriptyline (ELAVIL) 50 MG tablet TAKE 1 TABLET BY MOUTH EVERYDAY AT BEDTIME   atorvastatin (LIPITOR) 10 MG tablet Take 1 tablet (10 mg total) by mouth daily.   B Complex-C-Folic Acid (EQL SUPER B COMPLEX/VITAMIN C PO) Take 1 tablet by mouth daily.   buPROPion (WELLBUTRIN SR) 150 MG 12 hr tablet TAKE 1 TABLET BY MOUTH TWICE A DAY   clonazePAM (KLONOPIN) 0.5 MG tablet TAKE 1/2 TABLET BY MOUTH TWICE DAILY AS NEEDED   fluticasone (FLONASE) 50 MCG/ACT nasal spray SPRAY 2 SPRAYS INTO EACH NOSTRIL EVERY DAY    losartan-hydrochlorothiazide (HYZAAR) 50-12.5 MG tablet Take 1 tablet by mouth daily.   Multiple Vitamins-Minerals (WOMENS ONE DAILY PO) Take 1 tablet by mouth daily.   omeprazole (PRILOSEC) 20 MG capsule TAKE 1 CAPSULE BY MOUTH TWICE A DAY BEFORE MEALS   sertraline (ZOLOFT) 100 MG tablet TAKE 1 TABLET BY MOUTH EVERY DAY   traMADol (ULTRAM) 50 MG tablet TAKE 2 TABLETS BY MOUTH EVERY 8 HOURS AS NEEDED FOR PAIN   traZODone (DESYREL) 100 MG tablet TAKE 1 TABLET BY MOUTH TWICE A DAY   No facility-administered encounter medications on file as of 06/30/2020.    Patient Active Problem List   Diagnosis Date Noted   Hypertension associated with diabetes ([place]) 02/16/2020   History of total left hip replacement 02/16/2020   Atherosclerotic cerebrovascular disease 02/16/2020   Trimalleolar fracture of ankle, closed, left, sequela 05/31/2019   Frequent falls 05/31/2018   Arrhythmia 05/09/2016   Degenerative arthritis of carpometacarpal joint of thumb 07/28/2015   Encounter for preventive health examination 04/29/2015   Grief reaction 02/12/2015   DJD (degenerative joint disease) of knee 07/03/2014   Obesity 01/22/2014   OSA (obstructive sleep apnea) 01/19/2014   Anxiety    History of gastric bypass 10/30/2011   Personal history of diabetes mellitus 10/30/2011    Conditions to be addressed/monitored:HTN and Falls  Care Plan : Fall Risk (Adult)  Updates made by Leona Singleton, RN since 06/30/2020 12:00 AM     Problem: Fall Risk   Priority: High     Long-Range Goal: Absence of Fall and Fall-Related Injury   Start  Date: 06/09/2020  Expected End Date: 12/06/2020  This Visit's Progress: On track  Priority: High  Note:   Current Barriers:  Knowledge Deficits related to fall precautions in patient with frequent falls at home.  Patient reporting >20 falls in the past year.  States she trips over things frequently.  Last fall was June 2, tripping over a pair of boots.  States she is just a  little sore all over, denies injury but did not seek medical attention.  Denies fall since beginning of June.  Continues to live with her son and granddaughter.  Fall precautions and preventions reviewed and patient encouraged to use walker with all ambulation.  Reports she has been using cane for ambulation.  Discussed with patient consider obtaining life alert system. Decreased adherence to prescribed treatment for fall prevention Knowledge Deficits related to self care management of preventing falls Clinical Goal(s):  patient will demonstrate improved adherence to prescribed treatment plan for decreasing falls as evidenced by patient reporting and review of EMR patient will verbalize using fall risk reduction strategies discussed patient will not experience additional falls patient will verbalize understanding of plan for fall prevention Interventions:  Collaboration with [name], MD regarding development and update of comprehensive plan of care as evidenced by provider attestation and co-signature Inter-disciplinary care team collaboration (see longitudinal plan of care) Provided verbal education re: Potential causes of falls and Fall prevention strategies Reviewed medications and discussed potential side effects of medications such as dizziness and frequent urination; discussed taking medications only as prescribed Assessed for falls since last encounter. Assessed patients knowledge of fall risk prevention secondary to previously provided education. Reviewed scheduled/upcoming provider appointments including: 8/10 with PCP, need to reschedule missed AWV Discussed plans with patient for ongoing care management follow up and provided patient with direct contact information for care management team Activities of daily living skills assessed Assistive or adaptive device use encouraged Barriers to physical activity or exercise identified and barriers to physical activity or exercise  addressed Barriers to safety identified Cognition assessed and cognitive-stimulating activities promoted (puzzles) Fall prevention plan reviewed and updated Fear of falling, loss of independence and pain acknowledged Modification of home and work environment promoted; Leisure centre manager, discussed removing rugs and other trip hazards Vision and/or hearing aid use promoted Instructed and encouraged the patient to utilize walker appropriately with all ambulation Discussed and encouraged to change positions slowly Encouraged to wear secure fitting shoes at all times with ambulation Discussed life alert system and its benefits (patient states she does not need at this time while staying with her son), encouraged to reconsider Patient Goals/Self-Care Deficits:   Always use handrails on the stairs Always wear low-heeled or flat shoes or slippers with nonskid soles Call the doctor if I am feeling too drowsy Make an emergency alert plan in case I fall and learn how to get back up if I fall Pick up clutter from the floors Use a nonslip pad with throw rugs, or remove them completely Use a walker with all ambulation Change positions slowly Consider obtaining life alert Follow Up Plan: The care management team will reach out to the patient again over the next 30 business days.     Care Plan : Hypertension (Adult)  Updates made by Leona Singleton, RN since 06/30/2020 12:00 AM     Problem: Hypertension (Hypertension)   Priority: Medium     Long-Range Goal: Hypertension Monitored   Start Date: 06/09/2020  Expected End Date: 12/06/2020  This Visit's Progress:  On track  Priority: Medium  Note:   Objective:  Last practice recorded BP readings:  BP Readings from Last 3 Encounters:  05/24/20 (!) 146/70  02/15/20 (!) 160/88  05/31/19 120/74  Current Barriers:  Knowledge Deficits related to basic understanding of hypertension pathophysiology and self care management; reports monitoring blood  pressure at home but is not sure of the accuracy of home machine.  Last blood pressure checked at home was 145/78 with recent ranges of 140/70-80's.  Reports checking about 3 times a week.  Denies any episodes of hypotension. Case Manager Clinical Goal(s):  patient will verbalize understanding of plan for hypertension management patient will demonstrate improved adherence to prescribed treatment plan for hypertension as evidenced by taking all medications as prescribed, monitoring and recording blood pressure as directed, adhering to low sodium/DASH diet Interventions:  Collaboration with [name], MD regarding development and update of comprehensive plan of care as evidenced by provider attestation and co-signature Inter-disciplinary care team collaboration (see longitudinal plan of care) Evaluation of current treatment plan related to hypertension self management and patient's adherence to plan as established by provider. Provided education to patient re: stroke prevention, s/s of heart attack and stroke, DASH diet, complications of uncontrolled blood pressure Reviewed medications with patient and discussed importance of compliance Discussed plans with patient for ongoing care management follow up and provided patient with direct contact information for care management team Advised patient, providing education and rationale, to monitor blood pressure at least 3 times a week and record, calling PCP for findings outside established parameters.  Blood pressure trends reviewed Home blood pressure monitoring encouraged to check blood pressure 3 times per week; write blood pressure results in a log for provider review Verified patient has home blood pressure monitor and encouraged to take to next medical appointment for comparison Encouraged to attend all scheduled provider appointments; instructed and encouraged to reschedule missed AWV appointment Encouraged to call provider office for new  concerns, questions, or BP outside discussed parameters Discussed and encouraged to follow a low sodium diet/DASH diet; eating more fresh fruits and vegetables and decreasing canned vegetables/fruits Congratulated on attempt to eating more fresh fruits and vegetables Patient Goals/Self-Care Activities: Check blood pressure 3 times per week Write blood pressure results in a log and take log to medical appointments for provider review Eat more fresh fruits and vegetables, decreasing canned foods Low salt diet Contact office to reschedule AWV appointment Follow Up Plan: The care management team will reach out to the patient again over the next 30 business days.       Plan:The care management team will reach out to the patient again over the next 30 business days.  Hubert Azure RN, MSN RN Care Management Coordinator [facility] [phone] [email or web address]

## 2020-07-07 ENCOUNTER — Telehealth: Payer: Self-pay | Admitting: Internal Medicine

## 2020-07-07 NOTE — Telephone Encounter (Signed)
Left message for patient to call back and schedule Medicare Annual Wellness Visit (AWV) in office.   If not able to come in office, please offer to do virtually or by telephone.   Last AWV:06/02/2019  Please schedule at anytime with Nurse Health Advisor.

## 2020-07-08 ENCOUNTER — Other Ambulatory Visit: Payer: Self-pay | Admitting: Internal Medicine

## 2020-07-11 NOTE — Telephone Encounter (Signed)
Refilled: 05/03/2020 Last OV: 05/24/2020 Next OV: 08/16/2020

## 2020-07-13 ENCOUNTER — Telehealth: Payer: Self-pay

## 2020-07-13 NOTE — Chronic Care Management (AMB) (Signed)
  Care Management   Note  07/13/2020 Name: Jodi [name] MRN: [id number] DOB: [birth year]  Jodi [name] is a 75 y.o. year old female who is a primary care patient of Derrel Nip, Aris Everts, MD and is actively engaged with the care management team. I reached out to Jodi [name] by phone today to assist with re-scheduling an initial visit with the Licensed Clinical Social Worker  Follow up plan: Unsuccessful telephone outreach attempt made. A HIPAA compliant phone message was left for the patient providing contact information and requesting a return call.  The care management team will reach out to the patient again over the next 7 days.  If patient returns call to provider office, please advise to call [facility]  at [place], [place], [place],  [address] Direct Dial: [phone] [email or web address] Website: [email or web address]

## 2020-07-20 NOTE — Chronic Care Management (AMB) (Signed)
  Care Management   Note  07/20/2020 Name: Jodi [name] MRN: [id number] DOB: 11-18-1945  Jodi [name] is a 75 y.o. year old female who is a primary care patient of Derrel Nip, Aris Everts, MD and is actively engaged with the care management team. I reached out to Jodi [name] by phone today to assist with re-scheduling an initial visit with the Licensed Clinical Social Worker  Follow up plan: Unsuccessful telephone outreach attempt made. A HIPAA compliant phone message was left for the patient providing contact information and requesting a return call.  The care management team will reach out to the patient again over the next 7 days.  If patient returns call to provider office, please advise to call [facility]  at [place], [place], [place], [place] [address] Direct Dial: [phone] [email or web address]: [email or web address]

## 2020-07-24 ENCOUNTER — Ambulatory Visit (INDEPENDENT_AMBULATORY_CARE_PROVIDER_SITE_OTHER): Payer: Medicare Other | Admitting: *Deleted

## 2020-07-24 DIAGNOSIS — E1159 Type 2 diabetes mellitus with other circulatory complications: Secondary | ICD-10-CM

## 2020-07-24 DIAGNOSIS — I152 Hypertension secondary to endocrine disorders: Secondary | ICD-10-CM | POA: Diagnosis not present

## 2020-07-24 DIAGNOSIS — R296 Repeated falls: Secondary | ICD-10-CM

## 2020-07-24 NOTE — Patient Instructions (Signed)
Visit Information  PATIENT GOALS:  Goals Addressed             This Visit's Progress    (RNCM) Prevent Falls and Injury   On track    Timeframe:  Long-Range Goal Priority:  High Start Date:  06/09/20                           Expected End Date: 01/05/21                       Follow Up Date 08/30/20    Always use handrails on the stairs Always wear low-heeled or flat shoes or slippers with nonskid soles Call the doctor if I am feeling too drowsy Make an emergency alert plan in case I fall and learn how to get back up if I fall Pick up clutter from the floors Use a nonslip pad with throw rugs, or remove them completely Use a walker with all ambulation Change positions slowly Consider obtaining life alert   Why is this important?   Most falls happen when it is hard for you to walk safely. Your balance may be off because of an illness. You may have pain in your knees, hip or other joints.  You may be overly tired or taking medicines that make you sleepy. You may not be able to see or hear clearly.  Falls can lead to broken bones, bruises or other injuries.  There are things you can do to help prevent falling.     Notes:      (RNCM) Track and Manage My Blood Pressure-Hypertension   On track    Timeframe:  Long-Range Goal Priority:  Medium Start Date: 06/09/20                            Expected End Date:  12/06/20                     Follow Up Date 8/24//22    Check blood pressure 3 times per week Write blood pressure results in a log and take log to medical appointments for provider review Eat more fresh fruits and vegetables, decreasing canned foods Low salt diet Contact office to reschedule AWV appointment   Why is this important?   You won't feel high blood pressure, but it can still hurt your blood vessels.  High blood pressure can cause heart or kidney problems. It can also cause a stroke.  Making lifestyle changes like losing a little weight or eating less salt will  help.  Checking your blood pressure at home and at different times of the day can help to control blood pressure.  If the doctor prescribes medicine remember to take it the way the doctor ordered.  Call the office if you cannot afford the medicine or if there are questions about it.     Notes:         The patient verbalized understanding of instructions, educational materials, and care plan provided today and declined offer to receive copy of patient instructions, educational materials, and care plan.   The care management team will reach out to the patient again over the next 45 business days.   Hubert Azure RN, MSN RN Care Management Coordinator [facility] [phone] [email or web address]

## 2020-07-24 NOTE — Chronic Care Management (AMB) (Signed)
Chronic Care Management   CCM RN Visit Note  07/24/2020 Name: ELLEN GORIS MRN: [id number] DOB: 12-09-1945  Subjective: Jodi [name] is a 75 y.o. year old female who is a primary care patient of Derrel Nip, Aris Everts, MD. The care management team was consulted for assistance with disease management and care coordination needs.    Engaged with patient by telephone for follow up visit in response to provider referral for case management and/or care coordination services.   Consent to Services:  The patient was given information about Chronic Care Management services, agreed to services, and gave verbal consent prior to initiation of services.  Please see initial visit note for detailed documentation.   Patient agreed to services and verbal consent obtained.   Assessment: Review of patient past medical history, allergies, medications, health status, including review of consultants reports, laboratory and other test data, was performed as part of comprehensive evaluation and provision of chronic care management services.   SDOH (Social Determinants of Health) assessments and interventions performed:    CCM Care Plan  Allergies  Allergen Reactions   Morphine And Related Nausea And Vomiting    Outpatient Encounter Medications as of 07/24/2020  Medication Sig   traMADol (ULTRAM) 50 MG tablet TAKE 2 TABLETS BY MOUTH EVERY 8 HOURS AS NEEDED FOR PAIN   acetaminophen (TYLENOL) 650 MG CR tablet Take 650 mg by mouth every 8 (eight) hours as needed for pain.   amitriptyline (ELAVIL) 50 MG tablet TAKE 1 TABLET BY MOUTH EVERYDAY AT BEDTIME   atorvastatin (LIPITOR) 10 MG tablet Take 1 tablet (10 mg total) by mouth daily.   B Complex-C-Folic Acid (EQL SUPER B COMPLEX/VITAMIN C PO) Take 1 tablet by mouth daily.   buPROPion (WELLBUTRIN SR) 150 MG 12 hr tablet TAKE 1 TABLET BY MOUTH TWICE A DAY   clonazePAM (KLONOPIN) 0.5 MG tablet TAKE 1/2 TABLET BY MOUTH TWICE DAILY AS NEEDED   fluticasone (FLONASE)  50 MCG/ACT nasal spray SPRAY 2 SPRAYS INTO EACH NOSTRIL EVERY DAY   losartan-hydrochlorothiazide (HYZAAR) 50-12.5 MG tablet Take 1 tablet by mouth daily.   Multiple Vitamins-Minerals (WOMENS ONE DAILY PO) Take 1 tablet by mouth daily.   omeprazole (PRILOSEC) 20 MG capsule TAKE 1 CAPSULE BY MOUTH TWICE A DAY BEFORE MEALS   sertraline (ZOLOFT) 100 MG tablet TAKE 1 TABLET BY MOUTH EVERY DAY   traZODone (DESYREL) 100 MG tablet TAKE 1 TABLET BY MOUTH TWICE A DAY   No facility-administered encounter medications on file as of 07/24/2020.    Patient Active Problem List   Diagnosis Date Noted   Hypertension associated with diabetes ([place]) 02/16/2020   History of total left hip replacement 02/16/2020   Atherosclerotic cerebrovascular disease 02/16/2020   Trimalleolar fracture of ankle, closed, left, sequela 05/31/2019   Frequent falls 05/31/2018   Arrhythmia 05/09/2016   Degenerative arthritis of carpometacarpal joint of thumb 07/28/2015   Encounter for preventive health examination 04/29/2015   Grief reaction 02/12/2015   DJD (degenerative joint disease) of knee 07/03/2014   Obesity 01/22/2014   OSA (obstructive sleep apnea) 01/19/2014   Anxiety    History of gastric bypass 10/30/2011   Personal history of diabetes mellitus 10/30/2011    Conditions to be addressed/monitored:HTN and Falls  Care Plan : Fall Risk (Adult)  Updates made by Leona Singleton, RN since 07/24/2020 12:00 AM     Problem: Fall Risk   Priority: High     Long-Range Goal: Absence of Fall and Fall-Related Injury   Start  Date: 06/09/2020  Expected End Date: 12/06/2020  Recent Progress: On track  Priority: High  Note:   Current Barriers:  Knowledge Deficits related to fall precautions in patient with frequent falls at home.  Patient reporting >20 falls in the past year.  States she trips over things frequently.  Last fall was June 2, denies fall since beginning of June.  Continues to live with her son and  granddaughter.  Fall precautions and preventions reviewed and patient encouraged to use walker with all ambulation.  Reports she has been using cane for ambulation.  Discussed with patient consider obtaining life alert system. Decreased adherence to prescribed treatment for fall prevention Knowledge Deficits related to self care management of preventing falls Clinical Goal(s):  patient will demonstrate improved adherence to prescribed treatment plan for decreasing falls as evidenced by patient reporting and review of EMR patient will verbalize using fall risk reduction strategies discussed patient will not experience additional falls patient will verbalize understanding of plan for fall prevention Interventions:  Collaboration with [name], MD regarding development and update of comprehensive plan of care as evidenced by provider attestation and co-signature Inter-disciplinary care team collaboration (see longitudinal plan of care) Provided verbal education re: Potential causes of falls and Fall prevention strategies Reviewed medications and discussed potential side effects of medications such as dizziness and frequent urination; discussed taking medications only as prescribed Assessed for falls since last encounter. Assessed patients knowledge of fall risk prevention secondary to previously provided education. Reviewed scheduled/upcoming provider appointments including: 8/10 with PCP, need to reschedule missed AWV Discussed plans with patient for ongoing care management follow up and provided patient with direct contact information for care management team Activities of daily living skills assessed Barriers to physical activity or exercise identified and barriers to physical activity or exercise addressed and Barriers to safety identified Cognition assessed and cognitive-stimulating activities promoted (puzzles) Fall prevention plan reviewed and updated Fear of falling, loss of  independence and pain acknowledged Modification of home and work environment promoted; Leisure centre manager, discussed removing rugs and other trip hazards Instructed and encouraged the patient to utilize walker appropriately with all ambulation Discussed and encouraged to change positions slowly Encouraged to wear secure fitting shoes at all times with ambulation Discussed life alert system and its benefits (patient states she does not need at this time while staying with her son), encouraged to reconsider No significant change in interventions Patient Goals/Self-Care Deficits:   Always use handrails on the stairs Always wear low-heeled or flat shoes or slippers with nonskid soles Call the doctor if I am feeling too drowsy Make an emergency alert plan in case I fall and learn how to get back up if I fall Pick up clutter from the floors Use a nonslip pad with throw rugs, or remove them completely Use a walker with all ambulation Change positions slowly Consider obtaining life alert Follow Up Plan: The care management team will reach out to the patient again over the next 45 business days.     Care Plan : Hypertension (Adult)  Updates made by Leona Singleton, RN since 07/24/2020 12:00 AM     Problem: Hypertension (Hypertension)   Priority: Medium     Long-Range Goal: Hypertension Monitored   Start Date: 06/09/2020  Expected End Date: 01/06/2021  This Visit's Progress: On track  Recent Progress: On track  Priority: Medium  Note:   Objective:  Last practice recorded BP readings:  BP Readings from Last 3 Encounters:  05/24/20 (!) 146/70  02/15/20 (!) 160/88  05/31/19 120/74  Current Barriers:  Knowledge Deficits related to basic understanding of hypertension pathophysiology and self care management; reports monitoring blood pressure at home but is not sure of the accuracy of home machine.  Last blood pressure checked at home was 136/78 with recent ranges of 120-150/70-80's.   Reports checking about 3 times a week.  Denies any episodes of hypotension. Case Manager Clinical Goal(s):  patient will verbalize understanding of plan for hypertension management patient will demonstrate improved adherence to prescribed treatment plan for hypertension as evidenced by taking all medications as prescribed, monitoring and recording blood pressure as directed, adhering to low sodium/DASH diet Interventions:  Collaboration with [name], MD regarding development and update of comprehensive plan of care as evidenced by provider attestation and co-signature Inter-disciplinary care team collaboration (see longitudinal plan of care) Evaluation of current treatment plan related to hypertension self management and patient's adherence to plan as established by provider. Provided education to patient re: stroke prevention, s/s of heart attack and stroke, DASH diet, complications of uncontrolled blood pressure Reviewed medications with patient and discussed importance of compliance Discussed plans with patient for ongoing care management follow up and provided patient with direct contact information for care management team Advised patient, providing education and rationale, to monitor blood pressure at least 3 times a week and record, calling PCP for findings outside established parameters.  Blood pressure trends reviewed Home blood pressure monitoring encouraged to check blood pressure 3 times per week; write blood pressure results in a log for provider review Verified patient has home blood pressure monitor and encouraged to take to next medical appointment for comparison Encouraged to attend all scheduled provider appointments; instructed and encouraged to reschedule missed AWV appointment Encouraged to call provider office for new concerns, questions, or BP outside discussed parameters Discussed and encouraged to follow a low sodium diet/DASH diet; eating more fresh fruits and  vegetables and decreasing canned vegetables/fruits Congratulated on attempt to eating more fresh fruits and vegetables No significant Patient Goals/Self-Care Activities: Check blood pressure 3 times per week Write blood pressure results in a log and take log to medical appointments for provider review Eat more fresh fruits and vegetables, decreasing canned foods Low salt diet Contact office to reschedule AWV appointment Follow Up Plan: The care management team will reach out to the patient again over the next 45 business days.       Plan:The care management team will reach out to the patient again over the next 45 business days.  Hubert Azure RN, MSN RN Care Management Coordinator [facility] [phone] [email or web address]

## 2020-08-01 NOTE — Progress Notes (Signed)
Third unsuccessful outreach to reschedule

## 2020-08-01 NOTE — Chronic Care Management (AMB) (Signed)
  Care Management   Note  08/01/2020 Name: Jodi [name] MRN: [id number] DOB: [birth year]  Jodi [name] is a 75 y.o. year old female who is a primary care patient of Derrel Nip, Aris Everts, MD and is actively engaged with the care management team. I reached out to Jodi [name] by phone today to assist with re-scheduling an initial visit with the Licensed Clinical Social Worker  Follow up plan: Unable to make contact on outreach attempts x 3. PCP [name], MD notified via routed documentation in medical record.   Noreene Larsson, [place], [place], [place] [address] Direct Dial: [phone] [email or web address]

## 2020-08-02 ENCOUNTER — Telehealth: Payer: Self-pay | Admitting: Pharmacist

## 2020-08-02 ENCOUNTER — Telehealth: Payer: Medicare Other

## 2020-08-02 NOTE — Telephone Encounter (Addendum)
  Chronic Care Management   Note  08/02/2020 Name: Jodi [name] MRN: [id number] DOB: 1945/05/30   Attempted to contact patient for scheduled appointment for medication management support. Left HIPAA compliant message for patient to return my call at their convenience.    Care Guide has already attempted outreach patient three times to schedule w/ LCSW. Will defer scheduling PharmD f/u at this time until patient engages with CCM team member.     [name], PharmD, [place], [place] Clinical Pharmacist [employer] [facility] [place]

## 2020-08-10 ENCOUNTER — Other Ambulatory Visit: Payer: Self-pay | Admitting: Internal Medicine

## 2020-08-16 ENCOUNTER — Ambulatory Visit: Payer: Medicare Other | Admitting: Internal Medicine

## 2020-08-17 ENCOUNTER — Ambulatory Visit (INDEPENDENT_AMBULATORY_CARE_PROVIDER_SITE_OTHER): Payer: Medicare Other

## 2020-08-17 VITALS — Ht 64.0 in | Wt 195.0 lb

## 2020-08-17 DIAGNOSIS — Z0000 Encounter for general adult medical examination without abnormal findings: Secondary | ICD-10-CM | POA: Diagnosis not present

## 2020-08-17 NOTE — Progress Notes (Addendum)
Subjective:   Jodi [name] is a 75 y.o. female who presents for Medicare Annual (Subsequent) preventive examination.  Review of Systems    No ROS.  Medicare Wellness Virtual Visit.  Visual/audio telehealth visit, UTA vital signs.   See social history for additional risk factors.   Cardiac Risk Factors include: advanced age (>69mn, >>1women);hypertension     Objective:    Today's Vitals   08/17/20 1235  Weight: 195 lb (88.5 kg)  Height: '5\' 4"'$  (1.626 m)   Body mass index is 33.47 kg/m.  Advanced Directives 08/17/2020 06/09/2020 06/02/2019 03/09/2019 05/28/2018 05/26/2017 07/16/2016  Does Patient Have a Medical Advance Directive? No No No No No No No  Does patient want to make changes to medical advance directive? - - No - Patient declined - - - -  Would patient like information on creating a medical advance directive? No - Patient declined Yes (MAU/Ambulatory/Procedural Areas - Information given) - No - Patient declined Yes (MAU/Ambulatory/Procedural Areas - Information given) Yes (MAU/Ambulatory/Procedural Areas - Information given) No - Patient declined    Current Medications (verified) Outpatient Encounter Medications as of 08/17/2020  Medication Sig   traMADol (ULTRAM) 50 MG tablet TAKE 2 TABLETS BY MOUTH EVERY 8 HOURS AS NEEDED FOR PAIN   acetaminophen (TYLENOL) 650 MG CR tablet Take 650 mg by mouth every 8 (eight) hours as needed for pain.   amitriptyline (ELAVIL) 50 MG tablet TAKE 1 TABLET BY MOUTH EVERYDAY AT BEDTIME   atorvastatin (LIPITOR) 10 MG tablet Take 1 tablet (10 mg total) by mouth daily.   B Complex-C-Folic Acid (EQL SUPER B COMPLEX/VITAMIN C PO) Take 1 tablet by mouth daily.   buPROPion (WELLBUTRIN SR) 150 MG 12 hr tablet TAKE 1 TABLET BY MOUTH TWICE A DAY   clonazePAM (KLONOPIN) 0.5 MG tablet TAKE 1/2 TABLET BY MOUTH TWICE DAILY AS NEEDED   fluticasone (FLONASE) 50 MCG/ACT nasal spray SPRAY 2 SPRAYS INTO EACH NOSTRIL EVERY DAY   losartan-hydrochlorothiazide  (HYZAAR) 50-12.5 MG tablet Take 1 tablet by mouth daily.   Multiple Vitamins-Minerals (WOMENS ONE DAILY PO) Take 1 tablet by mouth daily.   omeprazole (PRILOSEC) 20 MG capsule TAKE 1 CAPSULE BY MOUTH TWICE A DAY BEFORE MEALS   sertraline (ZOLOFT) 100 MG tablet TAKE 1 TABLET BY MOUTH EVERY DAY   traZODone (DESYREL) 100 MG tablet TAKE 1 TABLET BY MOUTH TWICE A DAY   No facility-administered encounter medications on file as of 08/17/2020.    Allergies (verified) Morphine and related   History: Past Medical History:  Diagnosis Date   Anxiety    Arthritis    Diabetes mellitus without complication (HCC)    diet controlled since gastric bypass   Hypertension    Sleep apnea    Past Surgical History:  Procedure Laterality Date   BLADDER SUSPENSION     CHOLECYSTECTOMY     COLONOSCOPY WITH PROPOFOL N/A 07/16/2016   Procedure: COLONOSCOPY WITH PROPOFOL;  Surgeon: AJonathon Bellows MD;  [facility];  Service: Endoscopy;  Laterality: N/A;   JOINT REPLACEMENT     left hip, right knee   ROUX-EN-Y PROCEDURE  2010   tracheotomy     at age [id number] to respiratroy failure   Family History  Problem Relation Age of Onset   COPD Mother    Hypertension Mother    Ovarian cancer Mother [id number]      ovarian CA   Breast cancer Daughter    Breast cancer Paternal Aunt    Breast  cancer Paternal Grandmother    Cancer Sister    Social History   Socioeconomic History   Marital status: Widowed    Spouse name: Not on file   Number of children: Not on file   Years of education: Not on file   Highest education level: Not on file  Occupational History   Not on file  Tobacco Use   Smoking status: Never   Smokeless tobacco: Never  Vaping Use   Vaping Use: Never used  Substance and Sexual Activity   Alcohol use: No   Drug use: No   Sexual activity: Never  Other Topics Concern   Not on file  Social History Narrative   Not on file   Social Determinants of Health   Financial Resource Strain:  Low Risk    Difficulty of Paying Living Expenses: Not hard at all  Food Insecurity: No Food Insecurity   Worried About Charity fundraiser in the Last Year: Never true   [place] in the Last Year: Never true  Transportation Needs: No Transportation Needs   Lack of Transportation (Medical): No   Lack of Transportation (Non-Medical): No  Physical Activity: Not on file  Stress: No Stress Concern Present   Feeling of Stress : Not at all  Social Connections: Unknown   Frequency of Communication with Friends and Family: More than three times a week   Frequency of Social Gatherings with Friends and Family: More than three times a week   Attends Religious Services: Not on Electrical engineer or Organizations: Not on file   Attends Archivist Meetings: Not on file   Marital Status: Not on file    Tobacco Counseling Counseling given: Not Answered   Clinical Intake:  Pre-visit preparation completed: Yes        Diabetes: No  How often do you need to have someone help you when you read instructions, pamphlets, or other written materials from your doctor or pharmacy?: 1 - Never    Interpreter Needed?: No      Activities of Daily Living In your present state of health, do you have any difficulty performing the following activities: 08/17/2020  Hearing? N  Vision? N  Difficulty concentrating or making decisions? N  Walking or climbing stairs? N  Dressing or bathing? N  Doing errands, shopping? N  Preparing Food and eating ? N  Using the Toilet? N  In the past six months, have you accidently leaked urine? N  Do you have problems with loss of bowel control? N  Managing your Medications? N  Managing your Finances? N  Housekeeping or managing your Housekeeping? N  Some recent data might be hidden    Patient Care Team: [name], MD as PCP - General (Internal Medicine) De Hollingshead, RPH-CPP (Pharmacist) Leona Singleton, RN as Case  Manager  Indicate any recent Medical Services you may have received from other than [facility] providers in the past year (date may be approximate).     Assessment:   This is a routine wellness examination for Jodi [name].  I connected with Jodi [name] today by telephone and verified that I am speaking with the correct person using two identifiers. Location patient: home Location provider: work Persons participating in the virtual visit: patient, Marine scientist.    I discussed the limitations, risks, security and privacy concerns of performing an evaluation and management service by telephone and the availability of in person appointments. The patient expressed understanding  and verbally consented to this telephonic visit.    Interactive audio and video telecommunications were attempted between this provider and patient, however failed, due to patient having technical difficulties OR patient did not have access to video capability.  We continued and completed visit with audio only.  Some vital signs may be absent or patient reported.   Hearing/Vision screen Hearing Screening - Comments:: Patient reports difficulty hearing conversational tones. Tinnitus. Noise induced working Advice worker testing deferred in the last year per patient preference. Vision Screening - Comments:: Followed by [facility] [address])  Wears corrective lenses  They have regular follow up with the ophthalmologist  Dietary issues and exercise activities discussed:   Regular diet Good water intake   Goals Addressed             This Visit's Progress    Weight (lb) < 160 lb (72.6 kg)   195 lb (88.5 kg)    Reduce sugar        Depression Screen PHQ 2/9 Scores 08/17/2020 06/09/2020 05/24/2020 02/15/2020 06/02/2019 05/28/2018 05/26/2017  PHQ - 2 Score 0 1 3 0 1 1 0  PHQ- 9 Score - - 12 - - - -    Fall Risk Fall Risk  08/17/2020 07/24/2020 06/09/2020 05/24/2020 02/15/2020  Falls in the past year? - '1 1 1 1  '$ Comment - - - - -   Number falls in past yr: - 1 1 0 1  Comment - last fall 06/2020 - - -  Injury with Fall? - 0 0 0 1  Risk for fall due to : - History of fall(s);Impaired vision;Impaired balance/gait;Medication side effect;Impaired mobility Impaired balance/gait;Impaired mobility;Medication side effect;Impaired vision;Orthopedic patient;History of fall(s) History of fall(s) -  Risk for fall due to: Comment - - - - -  Follow up Falls evaluation completed Falls evaluation completed;Falls prevention discussed;Education provided Falls evaluation completed;Education provided;Falls prevention discussed Falls evaluation completed Falls evaluation completed    FALL RISK PREVENTION PERTAINING TO THE HOME: Adequate lighting in your home to reduce risk of falls? Yes   ASSISTIVE DEVICES UTILIZED TO PREVENT FALLS: Life alert? No  Use of a cane, walker or w/c? Yes   TIMED UP AND GO: Was the test performed? No .   Cognitive Function: MMSE - Mini Mental State Exam 05/26/2017 05/23/2016  Orientation to time 5 5  Orientation to Place 5 5  Registration 3 3  Attention/ Calculation 5 2  Attention/Calculation-comments - Difficulty performing simple calculations  Recall 2 3  Language- name 2 objects 2 2  Language- repeat 1 1  Language- follow 3 step command 3 3  Language- read & follow direction 1 1  Write a sentence 1 1  Copy design 1 1  Total score 29 27     6CIT Screen 06/02/2019 05/28/2018  What Year? 0 points 0 points  What month? 0 points 0 points  What time? 0 points 0 points  Count back from 20 - 0 points  Months in reverse 0 points 0 points  Repeat phrase - 0 points  Total Score - 0    Immunizations Immunization History  Administered Date(s) Administered   Fluad Quad(high Dose 65+) 11/11/2018, 10/01/2019   Influenza, High Dose Seasonal PF 12/08/2012, 02/10/2015, 02/07/2016, 11/11/2016, 11/19/2017   Pneumococcal Conjugate-13 12/08/2012   Pneumococcal Polysaccharide-23 10/22/2008, 02/10/2015   Zoster,  Live 12/08/2009    TDAP status: Due, Education has been provided regarding the importance of this vaccine. Advised may receive this vaccine at local  pharmacy or Health Dept. Aware to provide a copy of the vaccination record if obtained from local pharmacy or Health Dept. Verbalized acceptance and understanding. Deferred.  Shingrix vaccine- : Due, Education has been provided regarding the importance of this vaccine. Advised may receive this vaccine at local pharmacy or Health Dept. Aware to provide a copy of the vaccination record if obtained from local pharmacy or Health Dept. Verbalized acceptance and understanding. Deferred.  Health Maintenance Health Maintenance  Topic Date Due   MAMMOGRAM  05/29/2017   OPHTHALMOLOGY EXAM  08/17/2020 (Originally 12/16/2013)   INFLUENZA VACCINE  10/03/2020 (Originally 08/07/2020)   COLONOSCOPY (Pts 45-39yr Insurance coverage will need to be confirmed)  10/17/2020 (Originally 07/17/2019)   Zoster Vaccines- Shingrix (1 of 2) 11/17/2020 (Originally 10/26/1964)   COVID-19 Vaccine (1) 03/02/2021 (Originally 10/27/1950)   TETANUS/TDAP  08/17/2021 (Originally 10/26/1964)   HEMOGLOBIN A1C  12/14/2020   FOOT EXAM  05/24/2021   DEXA SCAN  Completed   Hepatitis C Screening  Completed   PNA vac Low Risk Adult  Completed   HPV VACCINES  Aged Out   Colonoscopy- deferred per patient.   Mammogram - plans to schedule. Number provided.   Eye exam- plans to schedule.   Lung Cancer Screening: (Low Dose CT Chest recommended if Age 74-80years, 30 pack-year currently smoking OR have quit w/in 15years.) does not qualify.   Vision Screening: Recommended annual ophthalmology exams for early detection of glaucoma and other disorders of the eye.  Dental Screening: Recommended annual dental exams for proper oral hygiene  Community Resource Referral / Chronic Care Management: CRR required this visit?  No   CCM required this visit?  No      Plan:     I have  personally reviewed and noted the following in the patient's chart:   Medical and social history Use of alcohol, tobacco or illicit drugs  Current medications and supplements including opioid prescriptions. Patient is currently taking opioid. Followed by pcp.  Functional ability and status Nutritional status Physical activity Advanced directives List of other physicians Hospitalizations, surgeries, and ER visits in previous 12 months Vitals Screenings to include cognitive, depression, and falls Referrals and appointments  In addition, I have reviewed and discussed with patient certain preventive protocols, quality metrics, and best practice recommendations. A written personalized care plan for preventive services as well as general preventive health recommendations were provided to patient via mail.     OBrien-Blaney, Bennette Hasty L, LPN   [id number]   I have reviewed the above information and agree with above.   TDeborra Medina MD

## 2020-08-17 NOTE — Patient Instructions (Addendum)
Jodi [name] , Thank you for taking time to come for your Medicare Wellness Visit. I appreciate your ongoing commitment to your health goals. Please review the following plan we discussed and let me know if I can assist you in the future.   These are the goals we discussed:  Goals       Patient Stated     Medication Management (pt-stated)      Patient Goals/Self-Care Activities Over the next 90 days, patient will:  - take medications as prescribed collaborate with provider on medication access solutions      Other     (RNCM) Prevent Falls and Injury      Timeframe:  Long-Range Goal Priority:  High Start Date:  06/09/20                           Expected End Date: 01/05/21                       Follow Up Date 08/30/20    Always use handrails on the stairs Always wear low-heeled or flat shoes or slippers with nonskid soles Call the doctor if I am feeling too drowsy Make an emergency alert plan in case I fall and learn how to get back up if I fall Pick up clutter from the floors Use a nonslip pad with throw rugs, or remove them completely Use a walker with all ambulation Change positions slowly Consider obtaining life alert   Why is this important?   Most falls happen when it is hard for you to walk safely. Your balance may be off because of an illness. You may have pain in your knees, hip or other joints.  You may be overly tired or taking medicines that make you sleepy. You may not be able to see or hear clearly.  Falls can lead to broken bones, bruises or other injuries.  There are things you can do to help prevent falling.     Notes:       (RNCM) Track and Manage My Blood Pressure-Hypertension      Timeframe:  Long-Range Goal Priority:  Medium Start Date: 06/09/20                            Expected End Date:  12/06/20                     Follow Up Date 8/24//22    Check blood pressure 3 times per week Write blood pressure results in a log and take log to medical  appointments for provider review Eat more fresh fruits and vegetables, decreasing canned foods Low salt diet Contact office to reschedule AWV appointment   Why is this important?   You won't feel high blood pressure, but it can still hurt your blood vessels.  High blood pressure can cause heart or kidney problems. It can also cause a stroke.  Making lifestyle changes like losing a little weight or eating less salt will help.  Checking your blood pressure at home and at different times of the day can help to control blood pressure.  If the doctor prescribes medicine remember to take it the way the doctor ordered.  Call the office if you cannot afford the medicine or if there are questions about it.     Notes:       Weight (lb) < 160  lb (72.6 kg)      Reduce sugar         This is a list of the screening recommended for you and due dates:  Health Maintenance  Topic Date Due   Mammogram  05/29/2017   Eye exam for diabetics  08/17/2020*   Flu Shot  10/03/2020*   Colon Cancer Screening  10/17/2020*   Zoster (Shingles) Vaccine (1 of 2) 11/17/2020*   COVID-19 Vaccine (1) 03/02/2021*   Tetanus Vaccine  08/17/2021*   Hemoglobin A1C  12/14/2020   Complete foot exam   05/24/2021   DEXA scan (bone density measurement)  Completed   Hepatitis C Screening: USPSTF Recommendation to screen - Ages 21-79 yo.  Completed   Pneumonia vaccines  Completed   HPV Vaccine  Aged Out  *Topic was postponed. The date shown is not the original due date.    Opioid Pain Medicine Management Opioids are powerful medicines that are used to treat moderate to severe pain. When used for short periods of time, they can help you: Sleep better. Do better in physical or occupational therapy. Feel better in the first few days after an injury. Recover from surgery. Opioids should be taken with the supervision of a trained health care provider. They should be taken for the shortest period of time possible. This is  because opioids can be addictive, and the longer you take opioids, the greater your risk of addiction (opioid use disorder). What are the risks? Using opioid pain medicines for longer than 3 days increases your risk of these side effects. Opioids can cause side effects, such as: Constipation. Nausea. Vomiting. Drowsiness. Confusion. Opioid use disorder. Breathing difficulties (respiratory depression). Taking opioid pain medicine for a long period of time can affect your ability to do daily tasks. It also puts you at risk for: Motor vehicle accidents. Depression. Suicide. Heart attack. Overdose, which can sometimes lead to death. What is a pain treatment plan? A pain treatment plan is an agreement between you and your health care provider. Pain is unique to each person, and treatments vary depending on your condition. To manage your pain successfully, you and your health care provider need to understand each other and work together. To help you do this: Discuss the goals of your treatment, including how much pain you might expect to have and how you will manage the pain. Review the risks and benefits of taking opioid medicines for your condition. Remember that a good treatment plan uses more than one approach and minimizes the chance of side effects. Be honest about the amount of medicines you take and about any drug or alcohol use. Get pain medicine prescriptions from only one health care provider. Pain can be managed with many types of alternative treatments. Ask your health care provider to refer you to one or more specialists who can help you manage pain through: Physical or occupational therapy. Counseling (cognitive behavioral therapy). Good nutrition. Biofeedback. Massage. Meditation. Non-opioid medicine. Following a gentle exercise program. Tapering your use of opioids If you have been taking opioid medicine for more than a few weeks, you may need to slowly decrease (taper)  how much you take until you stop completely. Tapering your use of opioids can decrease your chances of experiencing withdrawal symptoms, such as: Pain and cramping in the abdomen. Nausea. Sweating. Sleepiness. Restlessness. Uncontrollable shaking (tremors). Cravings for the medicine. Do not attempt to taper your use of opioids on your own. Talk with your health care provider about how to do  this. Your health care provider may prescribe a step-down schedule based on how much medicine you are taking and how long youhave been taking it. Follow these instructions at home: Safety and storage  While you are taking opioid pain medicine: Do not drive. Do not use machinery or power tools. Do not sign legal documents. Do not drink alcohol. Do not take sleeping pills. Do not supervise children by yourself. Do not participate in activities that require climbing or being in high places. Do not enter a body of water, such as a lake, river, ocean, spa, or swimming pool. Keep pain medicine in a locked cabinet or in a secure area where children cannot reach it. Do not share your pain medicine with anyone.  Getting rid of leftover pills Do not save any leftover pills. Get rid of leftover pills safely by: Taking the medicine to a prescription take-back program. This is usually offered by the county or law enforcement. Bringing them to a pharmacy that has a drug disposal container. Flushing them down the toilet. Check the label or package insert of your medicine to see whether this is safe to do. Throwing them out in the trash. Check the label or package insert of your medicine to see whether this is safe to do. If it is safe to throw it out, remove the medicine from the original container, put it into a sealable bag or container, and mix it with used coffee grounds, food scraps, dirt, or cat litter before putting it in the trash. Activity Return to your normal activities as told by your health care  provider. Ask your health care provider what activities are safe for you. Avoid activities that make your pain worse. Do exercises as told by your health care provider. General instructions You may need to take these actions to prevent or treat constipation: Drink enough fluid to keep your urine pale yellow. Take over-the-counter or prescription medicines. Eat foods that are high in fiber, such as beans, whole grains, and fresh fruits and vegetables. Limit foods that are high in fat and processed sugars, such as fried or sweet foods. Keep all follow-up visits as told by your health care provider. This is important. Where to find support If you have been taking opioids for a long time, you may benefit from receiving support for quitting from a local support group or counselor. Ask your healthcare provider for a referral to these resources in your area. Where to find more information Centers for Disease Control and Prevention (CDC): http://www.wolf.info/ [place] Food and Drug Administration (FDA): [place] Get help right away if: Seek medical care right away if you are taking opioids and you, or people close to you, notice any of the following: Difficulty breathing. Breathing that is slower or more shallow than normal. A very slow heartbeat (pulse). Severe confusion. Unconsciousness. Sleepiness. Slurred speech. Nausea and vomiting. Cold, clammy skin. Blue lips or fingernails. Limpness. Abnormally small pupils. If you think that you or someone else may have taken too much of an opioid medicine, get medical help right away. Call your local emergency services (911 in the [place] Do not drive yourself to the hospital. If you ever feel like you may hurt yourself or others, or have thoughts about taking your own life, get help right away. You can go to your nearest emergency department or call: Your local emergency services (911 in the [place] The hotline of the [facility]  806 752 9322 in the [place].). A suicide crisis helpline, such as  the National Suicide Prevention Lifeline at 539-298-6488. This is open 24 hours a day. Summary Opioid medicines can help you manage moderate to severe pain for a short period of time. A pain treatment plan is an agreement between you and your health care provider. Discuss the goals of your treatment, including how much pain you might expect to have and how you will manage the pain. Pain can be managed with many types of alternative treatments. If you think that you or someone else may have taken too much of an opioid, get medical help right away. This information is not intended to replace advice given to you by your health care provider. Make sure you discuss any questions you have with your healthcare provider. Document Revised: 10/30/2018 Document Reviewed: 01/23/2018 Elsevier Patient Education  [place].

## 2020-08-18 ENCOUNTER — Encounter: Payer: Self-pay | Admitting: Internal Medicine

## 2020-08-30 ENCOUNTER — Telehealth: Payer: Medicare Other

## 2020-08-30 ENCOUNTER — Telehealth: Payer: Self-pay | Admitting: *Deleted

## 2020-08-30 NOTE — Telephone Encounter (Signed)
  Care Management   Follow Up Note   08/30/2020 Name: Jodi [name] MRN: [id number] DOB: 1945/02/21   Referred by: [name], MD Reason for referral : No chief complaint on file.   An unsuccessful telephone outreach was attempted today. The patient was referred to the case management team for assistance with care management and care coordination.   Follow Up Plan: RNCM will seek assistance from [facility] in rescheduling appointment within the next 30 days.  Jodi Azure RN, MSN RN Care Management Coordinator [place] [phone] [name]

## 2020-09-01 ENCOUNTER — Telehealth: Payer: Self-pay

## 2020-09-01 NOTE — Chronic Care Management (AMB) (Signed)
  Care Management   Note  09/01/2020 Name: Jodi [name] MRN: [id number] DOB: December 07, 1945  Jodi [name] is a 75 y.o. year old female who is a primary care patient of Derrel Nip, Aris Everts, MD and is actively engaged with the care management team. I reached out to Jodi [name] by phone today to assist with re-scheduling a follow up visit with the RN Case Manager  Follow up plan: Unsuccessful telephone outreach attempt made. A HIPAA compliant phone message was left for the patient providing contact information and requesting a return call.  The care management team will reach out to the patient again over the next 7 days.  If patient returns call to provider office, please advise to call Jodi [name]  at [facility], [place], [place], [place] [address] Direct Dial: [phone] [name] Website: [email or web address]

## 2020-09-04 ENCOUNTER — Other Ambulatory Visit: Payer: Self-pay | Admitting: Internal Medicine

## 2020-09-14 ENCOUNTER — Other Ambulatory Visit: Payer: Self-pay

## 2020-09-14 ENCOUNTER — Ambulatory Visit (INDEPENDENT_AMBULATORY_CARE_PROVIDER_SITE_OTHER): Payer: Medicare Other | Admitting: Internal Medicine

## 2020-09-14 ENCOUNTER — Encounter: Payer: Self-pay | Admitting: Internal Medicine

## 2020-09-14 VITALS — BP 124/78 | HR 85 | Temp 96.8°F | Ht 64.0 in | Wt 203.4 lb

## 2020-09-14 DIAGNOSIS — F32A Depression, unspecified: Secondary | ICD-10-CM | POA: Insufficient documentation

## 2020-09-14 DIAGNOSIS — I152 Hypertension secondary to endocrine disorders: Secondary | ICD-10-CM

## 2020-09-14 DIAGNOSIS — M25512 Pain in left shoulder: Secondary | ICD-10-CM

## 2020-09-14 DIAGNOSIS — M170 Bilateral primary osteoarthritis of knee: Secondary | ICD-10-CM

## 2020-09-14 DIAGNOSIS — F329 Major depressive disorder, single episode, unspecified: Secondary | ICD-10-CM

## 2020-09-14 DIAGNOSIS — S82852S Displaced trimalleolar fracture of left lower leg, sequela: Secondary | ICD-10-CM | POA: Diagnosis not present

## 2020-09-14 DIAGNOSIS — E1159 Type 2 diabetes mellitus with other circulatory complications: Secondary | ICD-10-CM

## 2020-09-14 DIAGNOSIS — N1831 Chronic kidney disease, stage 3a: Secondary | ICD-10-CM | POA: Diagnosis not present

## 2020-09-14 DIAGNOSIS — I672 Cerebral atherosclerosis: Secondary | ICD-10-CM

## 2020-09-14 DIAGNOSIS — Z8639 Personal history of other endocrine, nutritional and metabolic disease: Secondary | ICD-10-CM

## 2020-09-14 DIAGNOSIS — G8929 Other chronic pain: Secondary | ICD-10-CM | POA: Insufficient documentation

## 2020-09-14 DIAGNOSIS — F419 Anxiety disorder, unspecified: Secondary | ICD-10-CM | POA: Diagnosis not present

## 2020-09-14 LAB — COMPREHENSIVE METABOLIC PANEL
ALT: 13 U/L (ref 0–35)
AST: 16 U/L (ref 0–37)
Albumin: 3.9 g/dL (ref 3.5–5.2)
Alkaline Phosphatase: 79 U/L (ref 39–117)
BUN: 20 mg/dL (ref 6–23)
CO2: 32 mEq/L (ref 19–32)
Calcium: 9.1 mg/dL (ref 8.4–10.5)
Chloride: 101 mEq/L (ref 96–112)
Creatinine, Ser: 1.07 mg/dL (ref 0.40–1.20)
GFR: 51.4 mL/min — ABNORMAL LOW (ref >60.00)
Glucose, Bld: 113 mg/dL — ABNORMAL HIGH (ref 70–99)
Potassium: 4 mEq/L (ref 3.5–5.1)
Sodium: 139 mEq/L (ref 135–145)
Total Bilirubin: 0.7 mg/dL (ref 0.2–1.2)
Total Protein: 5.9 g/dL — ABNORMAL LOW (ref 6.0–8.3)

## 2020-09-14 MED ORDER — CLONAZEPAM 0.5 MG PO TABS: 0.5000 mg | ORAL_TABLET | Freq: Every day | ORAL | 5 refills | Status: DC

## 2020-09-14 MED ORDER — SERTRALINE HCL 100 MG PO TABS: 150.0000 mg | ORAL_TABLET | Freq: Every day | ORAL | 1 refills | Status: DC

## 2020-09-14 NOTE — Assessment & Plan Note (Signed)
She is too apprehensive to examine and has llimited passive ROM.  Referring to [facility]

## 2020-09-14 NOTE — Assessment & Plan Note (Signed)
Vicodin has been discontinued due to lack of motivation to exercise.  Continue tramadol, refills given

## 2020-09-14 NOTE — Assessment & Plan Note (Signed)
Continue sertraline  Increased dose today to 150 mg   Refilling clonazepam she is using 0.5 mg daily

## 2020-09-14 NOTE — Assessment & Plan Note (Signed)
Continue once daily wellbutrin but change use to morning; (she has been taking at night)

## 2020-09-14 NOTE — Assessment & Plan Note (Signed)
a1c is steadily climbing due to weight gain and lack of exercise   Lab Results  Component Value Date   HGBA1C 6.0 06/14/2020

## 2020-09-14 NOTE — Patient Instructions (Addendum)
Increase your sertraline  to 150 mg (1.5 tablets) for your  depression  Take your buproprion in the morning  in the morning  ,  never at night!   I am making an orthopedics referral for your left shoulder pain.   Ask your granddaughter or son to help you down the steps so you can go for a walk on the sidewalk daily   Clonazepam and tramadol have been refilled

## 2020-09-14 NOTE — Progress Notes (Signed)
Subjective:  Patient ID: Jodi [name], female    DOB: 1945-07-16  Age: 75 y.o. MRN: [id number]  CC: The primary encounter diagnosis was Acute pain of left shoulder. Diagnoses of Hypertension associated with diabetes ([place]), Trimalleolar fracture of ankle, closed, left, sequela, Anxiety, Personal history of diabetes mellitus, Chronic depressive disorder, Atherosclerotic cerebrovascular disease, and Osteoarthritis of both knees, unspecified osteoarthritis type were also pertinent to this visit.  HPI Jodi [name] presents for  Chief Complaint  Patient presents with   Follow-up    Diabetes   LEFT SHOULDER PAIN  for the past several months  for the last week too painful to raise arm .  No history of fall, but had a near fall  2 weeks ago that was prevented by her grabbing onto a heavy stand .  No bruising or swelling  no pain radiating down the arm unless she tries to move the arm .  Trouble sleeping due to left side hurting.  Positive depression screen:  staying at home.  Since her left ankle fracture in 2020 she  has been afraid to walk outside .  Has a paved  sidewalks,  but the steps from front door have no handrails.   .  Back porch has 7 steps to the yard and she is afraid to go down them.  Has no exercise equipment set up at home.  Son renigged on offer to give her treadmill  and has not built the handrail for the front steps . She is gaining weight.  Taking sertraline 100 mg and wellbutrin  150 mg just once daily in the  morning      Outpatient Medications Prior to Visit  Medication Sig Dispense Refill   acetaminophen (TYLENOL) 650 MG CR tablet Take 650 mg by mouth every 8 (eight) hours as needed for pain.     amitriptyline (ELAVIL) 50 MG tablet TAKE 1 TABLET BY MOUTH EVERYDAY AT BEDTIME 90 tablet 1   atorvastatin (LIPITOR) 10 MG tablet Take 1 tablet (10 mg total) by mouth daily. 90 tablet 3   B Complex-C-Folic Acid (EQL SUPER B COMPLEX/VITAMIN C PO) Take 1 tablet by mouth daily.      buPROPion (WELLBUTRIN SR) 150 MG 12 hr tablet TAKE 1 TABLET BY MOUTH TWICE A DAY 180 tablet 1   fluticasone (FLONASE) 50 MCG/ACT nasal spray SPRAY 2 SPRAYS INTO EACH NOSTRIL EVERY DAY 48 mL 0   losartan-hydrochlorothiazide (HYZAAR) 50-12.5 MG tablet Take 1 tablet by mouth daily. 90 tablet 3   Multiple Vitamins-Minerals (WOMENS ONE DAILY PO) Take 1 tablet by mouth daily.     omeprazole (PRILOSEC) 20 MG capsule TAKE 1 CAPSULE BY MOUTH TWICE A DAY BEFORE MEALS 180 capsule 1   traMADol (ULTRAM) 50 MG tablet TAKE 2 TABLETS BY MOUTH EVERY 8 HOURS AS NEEDED FOR PAIN 180 tablet 5   traZODone (DESYREL) 100 MG tablet TAKE 1 TABLET BY MOUTH TWICE A DAY 180 tablet 1   clonazePAM (KLONOPIN) 0.5 MG tablet TAKE 1/2 TABLET BY MOUTH TWICE DAILY AS NEEDED 30 tablet 5   sertraline (ZOLOFT) 100 MG tablet TAKE 1 TABLET BY MOUTH EVERY DAY 90 tablet 1   No facility-administered medications prior to visit.    Review of Systems;  Patient denies headache, fevers, malaise, unintentional weight loss, skin rash, eye pain, sinus congestion and sinus pain, sore throat, dysphagia,  hemoptysis , cough, dyspnea, wheezing, chest pain, palpitations, orthopnea, edema, abdominal pain, nausea, melena, diarrhea, constipation, flank pain, dysuria, hematuria, urinary  Frequency, nocturia, numbness, tingling, seizures,  Focal weakness, Loss of consciousness,  Tremor, insomnia, depression, anxiety, and suicidal ideation.      Objective:  BP 124/78 (BP Location: Left Arm, Patient Position: Sitting, Cuff Size: Large)   Pulse 85   Temp (!) 96.8 F (36 C) (Temporal)   Ht '5\' 4"'$  (1.626 m)   Wt 203 lb 6.4 oz (92.3 kg)   SpO2 96%   BMI 34.91 kg/m   BP Readings from Last 3 Encounters:  09/14/20 124/78  05/24/20 (!) 146/70  02/15/20 (!) 160/88    Wt Readings from Last 3 Encounters:  09/14/20 203 lb 6.4 oz (92.3 kg)  08/17/20 195 lb (88.5 kg)  05/24/20 195 lb 3.2 oz (88.5 kg)    General appearance: alert, cooperative and appears  stated age Ears: normal TM's and external ear canals both ears Throat: lips, mucosa, and tongue normal; teeth and gums normal Neck: no adenopathy, no carotid bruit, supple, symmetrical, trachea midline and thyroid not enlarged, symmetric, no tenderness/mass/nodules Back: symmetric, no curvature. ROM normal. No CVA tenderness. Lungs: clear to auscultation bilaterally Heart: regular rate and rhythm, S1, S2 normal, no murmur, click, rub or gallop Abdomen: soft, non-tender; bowel sounds normal; no masses,  no organomegaly MSK:  left shoulder non tender to palpation. Pain and apprehension with passive abductio nand flexionto even  10 degrees  Pulses: 2+ and symmetric Skin: Skin color, texture, turgor normal. No rashes or lesions Lymph nodes: Cervical, supraclavicular, and axillary nodes normal.  Lab Results  Component Value Date   HGBA1C 6.0 06/14/2020   HGBA1C 5.9 02/15/2020   HGBA1C 5.6 11/11/2018    Lab Results  Component Value Date   CREATININE 1.04 06/14/2020   CREATININE 1.15 02/15/2020   CREATININE 1.10 11/11/2018    Lab Results  Component Value Date   WBC 5.1 02/15/2020   HGB 13.0 02/15/2020   HCT 38.7 02/15/2020   PLT 265.0 02/15/2020   GLUCOSE 97 06/14/2020   CHOL 184 06/14/2020   TRIG 159.0 (H) 06/14/2020   HDL 80.10 06/14/2020   LDLDIRECT 105.0 05/09/2016   LDLCALC 72 06/14/2020   ALT 17 06/14/2020   AST 20 06/14/2020   NA 141 06/14/2020   K 4.1 06/14/2020   CL 103 06/14/2020   CREATININE 1.04 06/14/2020   BUN 17 06/14/2020   CO2 33 (H) 06/14/2020   TSH 1.88 11/11/2018   HGBA1C 6.0 06/14/2020   MICROALBUR <0.7 02/15/2020    CT Head Wo Contrast  Result Date: 03/09/2019 CLINICAL DATA:  Fall EXAM: CT HEAD WITHOUT CONTRAST TECHNIQUE: Contiguous axial images were obtained from the base of the skull through the vertex without intravenous contrast. COMPARISON:  2008 FINDINGS: Brain: There is no acute intracranial hemorrhage, mass-effect, or edema. Gray-white  differentiation is preserved. There is no extra-axial fluid collection. Ventricles and sulci are within normal limits in size and configuration. Vascular: There is mild atherosclerotic calcification at the skull base. Skull: Calvarium is unremarkable. Sinuses/Orbits: No acute finding. Other: None. IMPRESSION: No evidence of acute intracranial injury. Electronically Signed   By: Macy Mis M.D.   On: 03/09/2019 13:29   CT Maxillofacial Wo Contrast  Result Date: 03/09/2019 CLINICAL DATA:  Fall EXAM: CT MAXILLOFACIAL WITHOUT CONTRAST TECHNIQUE: Multidetector CT imaging of the maxillofacial structures was performed. Multiplanar CT image reconstructions were also generated. COMPARISON:  None. FINDINGS: Osseous: No acute facial fracture. Degenerative changes at the right greater than left temporomandibular joints. Orbits: No intraorbital hematoma Sinuses: Aerated. Soft tissues: Right infraorbital soft tissue  swelling. Limited intracranial: Dictated separately. IMPRESSION: No acute facial fracture. Electronically Signed   By: Macy Mis M.D.   On: 03/09/2019 13:32    Assessment & Plan:   Problem List Items Addressed This Visit       Unprioritized   Personal history of diabetes mellitus    a1c is steadily climbing due to weight gain and lack of exercise   Lab Results  Component Value Date   HGBA1C 6.0 06/14/2020         Anxiety    Continue sertraline  Increased dose today to 150 mg   Refilling clonazepam she is using 0.5 mg daily       Relevant Medications   sertraline (ZOLOFT) 100 MG tablet   DJD (degenerative joint disease) of knee    Vicodin has been discontinued due to lack of motivation to exercise.  Continue tramadol, refills given       Trimalleolar fracture of ankle, closed, left, sequela    She remains apprehensive about walking outside of the home due to fear of falling       Hypertension associated with diabetes [facility])   Relevant Orders   Comprehensive metabolic panel    Atherosclerotic cerebrovascular disease    Continue statin therapy with goal LDL 70  Lab Results  Component Value Date   CHOL 184 06/14/2020   HDL 80.10 06/14/2020   LDLCALC 72 06/14/2020   LDLDIRECT 105.0 05/09/2016   TRIG 159.0 (H) 06/14/2020   CHOLHDL 2 06/14/2020         Acute pain of left shoulder - Primary    She is too apprehensive to examine and has llimited passive ROM.  Referring to [facility]       Relevant Orders   Ambulatory referral to Orthopedic Surgery   Chronic depressive disorder    Continue once daily wellbutrin but change use to morning; (she has been taking at night)      Relevant Medications   sertraline (ZOLOFT) 100 MG tablet   I spent 30 mintutes dedicated to the care of this patient on the date of this encounter to include pre-visit review of his medical history,  Face-to-face time with the patient , and post visit ordering of testing and therapeutics.  Meds ordered this encounter  Medications   sertraline (ZOLOFT) 100 MG tablet    Sig: Take 1.5 tablets (150 mg total) by mouth daily.    Dispense:  135 tablet    Refill:  1   clonazePAM (KLONOPIN) 0.5 MG tablet    Sig: Take 1 tablet (0.5 mg total) by mouth at bedtime.    Dispense:  30 tablet    Refill:  5    Not to exceed 5 additional fills before 07/31/2020.    Medications Discontinued During This Encounter  Medication Reason   clonazePAM (KLONOPIN) 0.5 MG tablet Reorder   sertraline (ZOLOFT) 100 MG tablet     Follow-up: Return in about 6 months (around 03/14/2021).   [name], MD

## 2020-09-14 NOTE — Assessment & Plan Note (Signed)
She remains apprehensive about walking outside of the home due to fear of falling

## 2020-09-14 NOTE — Assessment & Plan Note (Signed)
Continue statin therapy with goal LDL 70  Lab Results  Component Value Date   CHOL 184 06/14/2020   HDL 80.10 06/14/2020   LDLCALC 72 06/14/2020   LDLDIRECT 105.0 05/09/2016   TRIG 159.0 (H) 06/14/2020   CHOLHDL 2 06/14/2020

## 2020-09-18 DIAGNOSIS — N1832 Chronic kidney disease, stage 3b: Secondary | ICD-10-CM | POA: Insufficient documentation

## 2020-09-18 DIAGNOSIS — N183 Chronic kidney disease, stage 3 unspecified: Secondary | ICD-10-CM | POA: Insufficient documentation

## 2020-09-18 NOTE — Addendum Note (Signed)
Addended by: [name] on: 09/18/2020 01:18 PM   Modules accepted: Orders

## 2020-09-18 NOTE — Assessment & Plan Note (Signed)
Stable but present for over one year.  Nephrology referral advised,  Not using  NSAIDs due to gastric bypass histroy

## 2020-09-20 DIAGNOSIS — M19012 Primary osteoarthritis, left shoulder: Secondary | ICD-10-CM | POA: Diagnosis not present

## 2020-09-20 NOTE — Chronic Care Management (AMB) (Signed)
  Care Management   Note  09/20/2020 Name: THAILYN SHEILS MRN: [id number] DOB: Dec 31, 1945  Hoyle Sauer is a 75 y.o. year old female who is a primary care patient of Derrel Nip, Aris Everts, MD and is actively engaged with the care management team. I reached out to Hoyle Sauer by phone today to assist with re-scheduling a follow up visit with the RN Case Manager  Follow up plan: Telephone appointment with care management team member scheduled for:09/27/2020  Noreene Larsson, [place], [place] [facility]  [place], [place] [address] Direct Dial: [phone] [email or web address] Website: [email or web address]

## 2020-09-20 NOTE — Telephone Encounter (Signed)
Patient has been rescheduled.

## 2020-09-21 ENCOUNTER — Other Ambulatory Visit: Payer: Self-pay | Admitting: Internal Medicine

## 2020-09-22 ENCOUNTER — Telehealth: Payer: Self-pay | Admitting: Internal Medicine

## 2020-09-22 NOTE — Telephone Encounter (Signed)
Rejection Reason - Patient did not respond" Jodi [name] said on Sep 22, 2020 11:02 AM  Msg from [facility] [place] [facility]

## 2020-09-27 ENCOUNTER — Telehealth: Payer: Medicare Other

## 2020-09-27 ENCOUNTER — Telehealth: Payer: Self-pay | Admitting: *Deleted

## 2020-09-27 NOTE — Telephone Encounter (Signed)
  Care Management   Follow Up Note   09/27/2020 Name: Jodi [name] MRN: [id number] DOB: January 06, 1946   Referred by: [name], MD Reason for referral : Chronic Care Management ([place], [place])   An unsuccessful telephone outreach was attempted today. The patient was referred to the case management team for assistance with care management and care coordination.   Follow Up Plan: RNCM will seek assistance from Care Guides in rescheduling appointment within the next 30 days.  Hubert Azure RN, MSN RN Care Management Coordinator [place] [phone] [email or web address]

## 2020-10-02 ENCOUNTER — Telehealth: Payer: Self-pay

## 2020-10-02 NOTE — Chronic Care Management (AMB) (Signed)
  Care Management   Note  10/02/2020 Name: Jodi [name] MRN: [id number] DOB: Aug 01, 1945  Jodi [name] is a 75 y.o. year old female who is a primary care patient of Derrel Nip, Aris Everts, MD and is actively engaged with the care management team. I reached out to Jodi [name] by phone today to assist with re-scheduling a follow up visit with the RN Case Manager  Follow up plan: Unsuccessful telephone outreach attempt made.  The care management team will reach out to the patient again over the next 7 days.  If patient returns call to provider office, please advise to call [place]  at [facility], [place], [place], [place] [address] Direct Dial: [phone] [email or web address]: [email or web address]

## 2020-10-05 NOTE — Chronic Care Management (AMB) (Signed)
  Care Management   Note  10/05/2020 Name: [name] MRN: [id number] DOB: 12/22/1945  Jodi [name] is a 75 y.o. year old female who is a primary care patient of Derrel Nip, Aris Everts, MD and is actively engaged with the care management team. I reached out to Jodi [name] by phone today to assist with re-scheduling a follow up visit with the RN Case Manager  Follow up plan: Unsuccessful telephone outreach attempt made.The care management team will reach out to the patient again over the next 7 days.  If patient returns call to provider office, please advise to call [place]  at [place], [place], [place], [place] [address] Direct Dial: [phone] [email or web address]: [email or web address]

## 2020-10-16 DIAGNOSIS — M19012 Primary osteoarthritis, left shoulder: Secondary | ICD-10-CM | POA: Diagnosis not present

## 2020-10-27 NOTE — Telephone Encounter (Signed)
3rd unsuccessful outreach  

## 2020-10-27 NOTE — Chronic Care Management (AMB) (Signed)
  Care Management   Note  10/27/2020 Name: [name] MRN: [id number] DOB: [birth year]  Jodi [name] is a 75 y.o. year old female who is a primary care patient of Derrel Nip, Aris Everts, MD and is actively engaged with the care management team. I reached out to Jodi [name] by phone today to assist with re-scheduling a follow up visit with the RN Case Manager  Follow up plan: Unable to make contact on outreach attempts x 3. PCP [name], MD notified via routed documentation in medical record.   Noreene Larsson, [place], [place], [place] [address] Direct Dial: [phone] [email or web address] Website: [email or web address]

## 2021-01-05 ENCOUNTER — Other Ambulatory Visit: Payer: Self-pay | Admitting: Internal Medicine

## 2021-01-16 ENCOUNTER — Other Ambulatory Visit: Payer: Self-pay | Admitting: Internal Medicine

## 2021-01-28 ENCOUNTER — Telehealth: Payer: Self-pay | Admitting: Internal Medicine

## 2021-02-01 DIAGNOSIS — S92151A Displaced avulsion fracture (chip fracture) of right talus, initial encounter for closed fracture: Secondary | ICD-10-CM | POA: Diagnosis not present

## 2021-02-01 DIAGNOSIS — M779 Enthesopathy, unspecified: Secondary | ICD-10-CM | POA: Diagnosis not present

## 2021-02-01 DIAGNOSIS — E7800 Pure hypercholesterolemia, unspecified: Secondary | ICD-10-CM | POA: Diagnosis not present

## 2021-02-01 DIAGNOSIS — Z96651 Presence of right artificial knee joint: Secondary | ICD-10-CM | POA: Diagnosis not present

## 2021-02-01 DIAGNOSIS — S82891A Other fracture of right lower leg, initial encounter for closed fracture: Secondary | ICD-10-CM | POA: Diagnosis not present

## 2021-02-01 DIAGNOSIS — Z79899 Other long term (current) drug therapy: Secondary | ICD-10-CM | POA: Diagnosis not present

## 2021-02-01 DIAGNOSIS — Z043 Encounter for examination and observation following other accident: Secondary | ICD-10-CM | POA: Diagnosis not present

## 2021-02-01 DIAGNOSIS — E1122 Type 2 diabetes mellitus with diabetic chronic kidney disease: Secondary | ICD-10-CM | POA: Diagnosis not present

## 2021-02-01 DIAGNOSIS — I129 Hypertensive chronic kidney disease with stage 1 through stage 4 chronic kidney disease, or unspecified chronic kidney disease: Secondary | ICD-10-CM | POA: Diagnosis not present

## 2021-02-01 DIAGNOSIS — F32A Depression, unspecified: Secondary | ICD-10-CM | POA: Diagnosis not present

## 2021-02-01 DIAGNOSIS — Z791 Long term (current) use of non-steroidal anti-inflammatories (NSAID): Secondary | ICD-10-CM | POA: Diagnosis not present

## 2021-02-01 DIAGNOSIS — N189 Chronic kidney disease, unspecified: Secondary | ICD-10-CM | POA: Diagnosis not present

## 2021-02-01 DIAGNOSIS — M25471 Effusion, right ankle: Secondary | ICD-10-CM | POA: Diagnosis not present

## 2021-02-01 DIAGNOSIS — Z7982 Long term (current) use of aspirin: Secondary | ICD-10-CM | POA: Diagnosis not present

## 2021-02-01 DIAGNOSIS — Z885 Allergy status to narcotic agent status: Secondary | ICD-10-CM | POA: Diagnosis not present

## 2021-02-01 DIAGNOSIS — M19071 Primary osteoarthritis, right ankle and foot: Secondary | ICD-10-CM | POA: Diagnosis not present

## 2021-02-01 DIAGNOSIS — M25461 Effusion, right knee: Secondary | ICD-10-CM | POA: Diagnosis not present

## 2021-02-05 ENCOUNTER — Other Ambulatory Visit: Payer: Self-pay | Admitting: Internal Medicine

## 2021-02-07 ENCOUNTER — Other Ambulatory Visit: Payer: Self-pay | Admitting: Internal Medicine

## 2021-02-07 DIAGNOSIS — S82891A Other fracture of right lower leg, initial encounter for closed fracture: Secondary | ICD-10-CM | POA: Diagnosis not present

## 2021-02-07 NOTE — Telephone Encounter (Signed)
Attempted to call pt. Mail box full. Need to let pt know that the Trazodone was refilled on 01/30/2021 for a 3 month supply with one refill.

## 2021-02-07 NOTE — Telephone Encounter (Signed)
Patient called in stated need a refill for trazodone  100 MG sent to pharmacy to [facility] in [place]

## 2021-02-08 ENCOUNTER — Telehealth: Payer: Self-pay

## 2021-02-08 NOTE — Telephone Encounter (Signed)
PA for Tramadol has been submitted on covermymeds.  

## 2021-02-13 DIAGNOSIS — E7800 Pure hypercholesterolemia, unspecified: Secondary | ICD-10-CM | POA: Diagnosis not present

## 2021-02-13 DIAGNOSIS — S82891D Other fracture of right lower leg, subsequent encounter for closed fracture with routine healing: Secondary | ICD-10-CM | POA: Diagnosis not present

## 2021-02-13 DIAGNOSIS — S82891A Other fracture of right lower leg, initial encounter for closed fracture: Secondary | ICD-10-CM | POA: Diagnosis not present

## 2021-02-13 DIAGNOSIS — I129 Hypertensive chronic kidney disease with stage 1 through stage 4 chronic kidney disease, or unspecified chronic kidney disease: Secondary | ICD-10-CM | POA: Diagnosis not present

## 2021-02-13 DIAGNOSIS — Z7982 Long term (current) use of aspirin: Secondary | ICD-10-CM | POA: Diagnosis not present

## 2021-02-13 DIAGNOSIS — M1990 Unspecified osteoarthritis, unspecified site: Secondary | ICD-10-CM | POA: Diagnosis not present

## 2021-02-13 DIAGNOSIS — S92021D Displaced fracture of anterior process of right calcaneus, subsequent encounter for fracture with routine healing: Secondary | ICD-10-CM | POA: Diagnosis not present

## 2021-02-13 DIAGNOSIS — F32A Depression, unspecified: Secondary | ICD-10-CM | POA: Diagnosis not present

## 2021-02-13 DIAGNOSIS — Z96651 Presence of right artificial knee joint: Secondary | ICD-10-CM | POA: Diagnosis not present

## 2021-02-13 DIAGNOSIS — L89891 Pressure ulcer of other site, stage 1: Secondary | ICD-10-CM | POA: Diagnosis not present

## 2021-02-13 DIAGNOSIS — N189 Chronic kidney disease, unspecified: Secondary | ICD-10-CM | POA: Diagnosis not present

## 2021-02-13 DIAGNOSIS — E1122 Type 2 diabetes mellitus with diabetic chronic kidney disease: Secondary | ICD-10-CM | POA: Diagnosis not present

## 2021-02-13 DIAGNOSIS — Z9884 Bariatric surgery status: Secondary | ICD-10-CM | POA: Diagnosis not present

## 2021-02-16 DIAGNOSIS — S92021D Displaced fracture of anterior process of right calcaneus, subsequent encounter for fracture with routine healing: Secondary | ICD-10-CM | POA: Diagnosis not present

## 2021-02-16 DIAGNOSIS — S82891D Other fracture of right lower leg, subsequent encounter for closed fracture with routine healing: Secondary | ICD-10-CM | POA: Diagnosis not present

## 2021-02-16 DIAGNOSIS — I129 Hypertensive chronic kidney disease with stage 1 through stage 4 chronic kidney disease, or unspecified chronic kidney disease: Secondary | ICD-10-CM | POA: Diagnosis not present

## 2021-02-16 DIAGNOSIS — Z96651 Presence of right artificial knee joint: Secondary | ICD-10-CM | POA: Diagnosis not present

## 2021-02-16 DIAGNOSIS — N189 Chronic kidney disease, unspecified: Secondary | ICD-10-CM | POA: Diagnosis not present

## 2021-02-16 DIAGNOSIS — Z7982 Long term (current) use of aspirin: Secondary | ICD-10-CM | POA: Diagnosis not present

## 2021-02-16 DIAGNOSIS — F32A Depression, unspecified: Secondary | ICD-10-CM | POA: Diagnosis not present

## 2021-02-16 DIAGNOSIS — Z9884 Bariatric surgery status: Secondary | ICD-10-CM | POA: Diagnosis not present

## 2021-02-16 DIAGNOSIS — M1990 Unspecified osteoarthritis, unspecified site: Secondary | ICD-10-CM | POA: Diagnosis not present

## 2021-02-16 DIAGNOSIS — E1122 Type 2 diabetes mellitus with diabetic chronic kidney disease: Secondary | ICD-10-CM | POA: Diagnosis not present

## 2021-02-16 DIAGNOSIS — L89891 Pressure ulcer of other site, stage 1: Secondary | ICD-10-CM | POA: Diagnosis not present

## 2021-02-16 DIAGNOSIS — E7800 Pure hypercholesterolemia, unspecified: Secondary | ICD-10-CM | POA: Diagnosis not present

## 2021-02-19 DIAGNOSIS — E7800 Pure hypercholesterolemia, unspecified: Secondary | ICD-10-CM | POA: Diagnosis not present

## 2021-02-19 DIAGNOSIS — Z9884 Bariatric surgery status: Secondary | ICD-10-CM | POA: Diagnosis not present

## 2021-02-19 DIAGNOSIS — S82891D Other fracture of right lower leg, subsequent encounter for closed fracture with routine healing: Secondary | ICD-10-CM | POA: Diagnosis not present

## 2021-02-19 DIAGNOSIS — Z96651 Presence of right artificial knee joint: Secondary | ICD-10-CM | POA: Diagnosis not present

## 2021-02-19 DIAGNOSIS — I129 Hypertensive chronic kidney disease with stage 1 through stage 4 chronic kidney disease, or unspecified chronic kidney disease: Secondary | ICD-10-CM | POA: Diagnosis not present

## 2021-02-19 DIAGNOSIS — L89891 Pressure ulcer of other site, stage 1: Secondary | ICD-10-CM | POA: Diagnosis not present

## 2021-02-19 DIAGNOSIS — E1122 Type 2 diabetes mellitus with diabetic chronic kidney disease: Secondary | ICD-10-CM | POA: Diagnosis not present

## 2021-02-19 DIAGNOSIS — F32A Depression, unspecified: Secondary | ICD-10-CM | POA: Diagnosis not present

## 2021-02-19 DIAGNOSIS — M1990 Unspecified osteoarthritis, unspecified site: Secondary | ICD-10-CM | POA: Diagnosis not present

## 2021-02-19 DIAGNOSIS — N189 Chronic kidney disease, unspecified: Secondary | ICD-10-CM | POA: Diagnosis not present

## 2021-02-19 DIAGNOSIS — S92021D Displaced fracture of anterior process of right calcaneus, subsequent encounter for fracture with routine healing: Secondary | ICD-10-CM | POA: Diagnosis not present

## 2021-02-19 DIAGNOSIS — Z7982 Long term (current) use of aspirin: Secondary | ICD-10-CM | POA: Diagnosis not present

## 2021-02-21 DIAGNOSIS — M1990 Unspecified osteoarthritis, unspecified site: Secondary | ICD-10-CM | POA: Diagnosis not present

## 2021-02-21 DIAGNOSIS — I129 Hypertensive chronic kidney disease with stage 1 through stage 4 chronic kidney disease, or unspecified chronic kidney disease: Secondary | ICD-10-CM | POA: Diagnosis not present

## 2021-02-21 DIAGNOSIS — S82891D Other fracture of right lower leg, subsequent encounter for closed fracture with routine healing: Secondary | ICD-10-CM | POA: Diagnosis not present

## 2021-02-21 DIAGNOSIS — Z96651 Presence of right artificial knee joint: Secondary | ICD-10-CM | POA: Diagnosis not present

## 2021-02-21 DIAGNOSIS — E1122 Type 2 diabetes mellitus with diabetic chronic kidney disease: Secondary | ICD-10-CM | POA: Diagnosis not present

## 2021-02-21 DIAGNOSIS — Z7982 Long term (current) use of aspirin: Secondary | ICD-10-CM | POA: Diagnosis not present

## 2021-02-21 DIAGNOSIS — S92021D Displaced fracture of anterior process of right calcaneus, subsequent encounter for fracture with routine healing: Secondary | ICD-10-CM | POA: Diagnosis not present

## 2021-02-21 DIAGNOSIS — N189 Chronic kidney disease, unspecified: Secondary | ICD-10-CM | POA: Diagnosis not present

## 2021-02-21 DIAGNOSIS — E7800 Pure hypercholesterolemia, unspecified: Secondary | ICD-10-CM | POA: Diagnosis not present

## 2021-02-21 DIAGNOSIS — L89891 Pressure ulcer of other site, stage 1: Secondary | ICD-10-CM | POA: Diagnosis not present

## 2021-02-21 DIAGNOSIS — F32A Depression, unspecified: Secondary | ICD-10-CM | POA: Diagnosis not present

## 2021-02-21 DIAGNOSIS — Z9884 Bariatric surgery status: Secondary | ICD-10-CM | POA: Diagnosis not present

## 2021-02-23 DIAGNOSIS — S82891A Other fracture of right lower leg, initial encounter for closed fracture: Secondary | ICD-10-CM | POA: Diagnosis not present

## 2021-02-23 DIAGNOSIS — M25471 Effusion, right ankle: Secondary | ICD-10-CM | POA: Diagnosis not present

## 2021-02-26 DIAGNOSIS — F32A Depression, unspecified: Secondary | ICD-10-CM | POA: Diagnosis not present

## 2021-02-26 DIAGNOSIS — Z96651 Presence of right artificial knee joint: Secondary | ICD-10-CM | POA: Diagnosis not present

## 2021-02-26 DIAGNOSIS — E1122 Type 2 diabetes mellitus with diabetic chronic kidney disease: Secondary | ICD-10-CM | POA: Diagnosis not present

## 2021-02-26 DIAGNOSIS — M1990 Unspecified osteoarthritis, unspecified site: Secondary | ICD-10-CM | POA: Diagnosis not present

## 2021-02-26 DIAGNOSIS — N189 Chronic kidney disease, unspecified: Secondary | ICD-10-CM | POA: Diagnosis not present

## 2021-02-26 DIAGNOSIS — S92021D Displaced fracture of anterior process of right calcaneus, subsequent encounter for fracture with routine healing: Secondary | ICD-10-CM | POA: Diagnosis not present

## 2021-02-26 DIAGNOSIS — L89891 Pressure ulcer of other site, stage 1: Secondary | ICD-10-CM | POA: Diagnosis not present

## 2021-02-26 DIAGNOSIS — E7800 Pure hypercholesterolemia, unspecified: Secondary | ICD-10-CM | POA: Diagnosis not present

## 2021-02-26 DIAGNOSIS — I129 Hypertensive chronic kidney disease with stage 1 through stage 4 chronic kidney disease, or unspecified chronic kidney disease: Secondary | ICD-10-CM | POA: Diagnosis not present

## 2021-02-26 DIAGNOSIS — Z7982 Long term (current) use of aspirin: Secondary | ICD-10-CM | POA: Diagnosis not present

## 2021-02-26 DIAGNOSIS — S82891D Other fracture of right lower leg, subsequent encounter for closed fracture with routine healing: Secondary | ICD-10-CM | POA: Diagnosis not present

## 2021-02-26 DIAGNOSIS — Z9884 Bariatric surgery status: Secondary | ICD-10-CM | POA: Diagnosis not present

## 2021-02-28 DIAGNOSIS — L89891 Pressure ulcer of other site, stage 1: Secondary | ICD-10-CM | POA: Diagnosis not present

## 2021-02-28 DIAGNOSIS — E1122 Type 2 diabetes mellitus with diabetic chronic kidney disease: Secondary | ICD-10-CM | POA: Diagnosis not present

## 2021-02-28 DIAGNOSIS — N189 Chronic kidney disease, unspecified: Secondary | ICD-10-CM | POA: Diagnosis not present

## 2021-02-28 DIAGNOSIS — S92021D Displaced fracture of anterior process of right calcaneus, subsequent encounter for fracture with routine healing: Secondary | ICD-10-CM | POA: Diagnosis not present

## 2021-02-28 DIAGNOSIS — E7800 Pure hypercholesterolemia, unspecified: Secondary | ICD-10-CM | POA: Diagnosis not present

## 2021-02-28 DIAGNOSIS — Z96651 Presence of right artificial knee joint: Secondary | ICD-10-CM | POA: Diagnosis not present

## 2021-02-28 DIAGNOSIS — Z9884 Bariatric surgery status: Secondary | ICD-10-CM | POA: Diagnosis not present

## 2021-02-28 DIAGNOSIS — F32A Depression, unspecified: Secondary | ICD-10-CM | POA: Diagnosis not present

## 2021-02-28 DIAGNOSIS — M1990 Unspecified osteoarthritis, unspecified site: Secondary | ICD-10-CM | POA: Diagnosis not present

## 2021-02-28 DIAGNOSIS — Z7982 Long term (current) use of aspirin: Secondary | ICD-10-CM | POA: Diagnosis not present

## 2021-02-28 DIAGNOSIS — S82891D Other fracture of right lower leg, subsequent encounter for closed fracture with routine healing: Secondary | ICD-10-CM | POA: Diagnosis not present

## 2021-02-28 DIAGNOSIS — I129 Hypertensive chronic kidney disease with stage 1 through stage 4 chronic kidney disease, or unspecified chronic kidney disease: Secondary | ICD-10-CM | POA: Diagnosis not present

## 2021-03-06 DIAGNOSIS — Z96651 Presence of right artificial knee joint: Secondary | ICD-10-CM | POA: Diagnosis not present

## 2021-03-06 DIAGNOSIS — S82891D Other fracture of right lower leg, subsequent encounter for closed fracture with routine healing: Secondary | ICD-10-CM | POA: Diagnosis not present

## 2021-03-06 DIAGNOSIS — E1122 Type 2 diabetes mellitus with diabetic chronic kidney disease: Secondary | ICD-10-CM | POA: Diagnosis not present

## 2021-03-06 DIAGNOSIS — E7800 Pure hypercholesterolemia, unspecified: Secondary | ICD-10-CM | POA: Diagnosis not present

## 2021-03-06 DIAGNOSIS — I129 Hypertensive chronic kidney disease with stage 1 through stage 4 chronic kidney disease, or unspecified chronic kidney disease: Secondary | ICD-10-CM | POA: Diagnosis not present

## 2021-03-06 DIAGNOSIS — M1990 Unspecified osteoarthritis, unspecified site: Secondary | ICD-10-CM | POA: Diagnosis not present

## 2021-03-06 DIAGNOSIS — F32A Depression, unspecified: Secondary | ICD-10-CM | POA: Diagnosis not present

## 2021-03-06 DIAGNOSIS — L89891 Pressure ulcer of other site, stage 1: Secondary | ICD-10-CM | POA: Diagnosis not present

## 2021-03-06 DIAGNOSIS — Z7982 Long term (current) use of aspirin: Secondary | ICD-10-CM | POA: Diagnosis not present

## 2021-03-06 DIAGNOSIS — N189 Chronic kidney disease, unspecified: Secondary | ICD-10-CM | POA: Diagnosis not present

## 2021-03-06 DIAGNOSIS — S92021D Displaced fracture of anterior process of right calcaneus, subsequent encounter for fracture with routine healing: Secondary | ICD-10-CM | POA: Diagnosis not present

## 2021-03-06 DIAGNOSIS — Z9884 Bariatric surgery status: Secondary | ICD-10-CM | POA: Diagnosis not present

## 2021-03-15 ENCOUNTER — Ambulatory Visit (INDEPENDENT_AMBULATORY_CARE_PROVIDER_SITE_OTHER): Payer: Medicare Other | Admitting: Internal Medicine

## 2021-03-15 ENCOUNTER — Other Ambulatory Visit: Payer: Self-pay

## 2021-03-15 ENCOUNTER — Other Ambulatory Visit: Payer: Self-pay | Admitting: Internal Medicine

## 2021-03-15 ENCOUNTER — Encounter: Payer: Self-pay | Admitting: Internal Medicine

## 2021-03-15 ENCOUNTER — Ambulatory Visit: Payer: Self-pay | Admitting: Pharmacist

## 2021-03-15 ENCOUNTER — Telehealth: Payer: Self-pay

## 2021-03-15 VITALS — BP 122/70 | HR 85 | Temp 97.5°F | Ht 64.0 in | Wt 215.4 lb

## 2021-03-15 DIAGNOSIS — N1831 Chronic kidney disease, stage 3a: Secondary | ICD-10-CM

## 2021-03-15 DIAGNOSIS — S82891D Other fracture of right lower leg, subsequent encounter for closed fracture with routine healing: Secondary | ICD-10-CM

## 2021-03-15 DIAGNOSIS — E1159 Type 2 diabetes mellitus with other circulatory complications: Secondary | ICD-10-CM | POA: Diagnosis not present

## 2021-03-15 DIAGNOSIS — Z1231 Encounter for screening mammogram for malignant neoplasm of breast: Secondary | ICD-10-CM

## 2021-03-15 DIAGNOSIS — Z79899 Other long term (current) drug therapy: Secondary | ICD-10-CM

## 2021-03-15 DIAGNOSIS — N3941 Urge incontinence: Secondary | ICD-10-CM

## 2021-03-15 DIAGNOSIS — E559 Vitamin D deficiency, unspecified: Secondary | ICD-10-CM

## 2021-03-15 DIAGNOSIS — F32A Depression, unspecified: Secondary | ICD-10-CM | POA: Diagnosis not present

## 2021-03-15 DIAGNOSIS — E785 Hyperlipidemia, unspecified: Secondary | ICD-10-CM | POA: Diagnosis not present

## 2021-03-15 DIAGNOSIS — S82891A Other fracture of right lower leg, initial encounter for closed fracture: Secondary | ICD-10-CM | POA: Insufficient documentation

## 2021-03-15 DIAGNOSIS — D126 Benign neoplasm of colon, unspecified: Secondary | ICD-10-CM

## 2021-03-15 DIAGNOSIS — I152 Hypertension secondary to endocrine disorders: Secondary | ICD-10-CM | POA: Diagnosis not present

## 2021-03-15 DIAGNOSIS — E6609 Other obesity due to excess calories: Secondary | ICD-10-CM

## 2021-03-15 DIAGNOSIS — Z6833 Body mass index (BMI) 33.0-33.9, adult: Secondary | ICD-10-CM

## 2021-03-15 DIAGNOSIS — Z8781 Personal history of (healed) traumatic fracture: Secondary | ICD-10-CM | POA: Insufficient documentation

## 2021-03-15 DIAGNOSIS — F419 Anxiety disorder, unspecified: Secondary | ICD-10-CM

## 2021-03-15 LAB — URINALYSIS, ROUTINE W REFLEX MICROSCOPIC
Hgb urine dipstick: NEGATIVE
Ketones, ur: NEGATIVE
Nitrite: POSITIVE — AB
RBC / HPF: NONE SEEN (ref 0–?)
Specific Gravity, Urine: 1.025 (ref 1.000–1.030)
Total Protein, Urine: NEGATIVE
Urine Glucose: NEGATIVE
Urobilinogen, UA: 1 (ref 0.0–1.0)
pH: 6 (ref 5.0–8.0)

## 2021-03-15 LAB — MICROALBUMIN / CREATININE URINE RATIO
Creatinine,U: 166.1 mg/dL
Microalb Creat Ratio: 0.9 mg/g (ref 0.0–30.0)
Microalb, Ur: 1.5 mg/dL (ref 0.0–1.9)

## 2021-03-15 LAB — COMPREHENSIVE METABOLIC PANEL
ALT: 12 U/L (ref 0–35)
AST: 18 U/L (ref 0–37)
Albumin: 4.1 g/dL (ref 3.5–5.2)
Alkaline Phosphatase: 93 U/L (ref 39–117)
BUN: 18 mg/dL (ref 6–23)
CO2: 31 mEq/L (ref 19–32)
Calcium: 9.1 mg/dL (ref 8.4–10.5)
Chloride: 102 mEq/L (ref 96–112)
Creatinine, Ser: 1.07 mg/dL (ref 0.40–1.20)
GFR: 51.22 mL/min — ABNORMAL LOW (ref >60.00)
Glucose, Bld: 110 mg/dL — ABNORMAL HIGH (ref 70–99)
Potassium: 3.8 mEq/L (ref 3.5–5.1)
Sodium: 141 mEq/L (ref 135–145)
Total Bilirubin: 0.5 mg/dL (ref 0.2–1.2)
Total Protein: 5.9 g/dL — ABNORMAL LOW (ref 6.0–8.3)

## 2021-03-15 LAB — CBC WITH DIFFERENTIAL/PLATELET
Basophils Absolute: 0 10*3/uL (ref 0.0–0.1)
Basophils Relative: 0.5 % (ref 0.0–3.0)
Eosinophils Absolute: 0.1 10*3/uL (ref 0.0–0.7)
Eosinophils Relative: 0.8 % (ref 0.0–5.0)
HCT: 39.3 % (ref 36.0–46.0)
Hemoglobin: 13 g/dL (ref 12.0–15.0)
Lymphocytes Relative: 20.3 % (ref 12.0–46.0)
Lymphs Abs: 1.6 10*3/uL (ref 0.7–4.0)
MCHC: 33 g/dL (ref 30.0–36.0)
MCV: 89.2 fl (ref 78.0–100.0)
Monocytes Absolute: 0.5 10*3/uL (ref 0.1–1.0)
Monocytes Relative: 6.5 % (ref 3.0–12.0)
Neutro Abs: 5.7 10*3/uL (ref 1.4–7.7)
Neutrophils Relative %: 71.9 % (ref 43.0–77.0)
Platelets: 260 10*3/uL (ref 150.0–400.0)
RBC: 4.41 Mil/uL (ref 3.87–5.11)
RDW: 14.6 % (ref 11.5–15.5)
WBC: 8 10*3/uL (ref 4.0–10.5)

## 2021-03-15 LAB — LIPID PANEL
Cholesterol: 159 mg/dL (ref 0–200)
HDL: 81 mg/dL (ref >39.00)
LDL Cholesterol: 57 mg/dL (ref 0–99)
NonHDL: 78.2
Total CHOL/HDL Ratio: 2
Triglycerides: 107 mg/dL (ref 0.0–149.0)
VLDL: 21.4 mg/dL (ref 0.0–40.0)

## 2021-03-15 LAB — TSH: TSH: 3.87 u[IU]/mL (ref 0.35–5.50)

## 2021-03-15 LAB — HEMOGLOBIN A1C: Hgb A1c MFr Bld: 6.1 % (ref 4.6–6.5)

## 2021-03-15 LAB — VITAMIN D 25 HYDROXY (VIT D DEFICIENCY, FRACTURES): VITD: 34.99 ng/mL (ref 30.00–100.00)

## 2021-03-15 MED ORDER — OXYBUTYNIN CHLORIDE ER 5 MG PO TB24: 5.0000 mg | ORAL_TABLET | Freq: Every day | ORAL | 1 refills | Status: DC

## 2021-03-15 MED ORDER — GABAPENTIN 100 MG PO CAPS: 100.0000 mg | ORAL_CAPSULE | Freq: Three times a day (TID) | ORAL | 1 refills | Status: DC

## 2021-03-15 MED ORDER — CLONAZEPAM 0.5 MG PO TABS: 0.5000 mg | ORAL_TABLET | Freq: Every day | ORAL | 5 refills | Status: DC

## 2021-03-15 NOTE — Chronic Care Management (AMB) (Signed)
?  Chronic Care Management  ? ?Note ? ?03/15/2021 ?Name: Jodi [name] MRN: [id number] DOB: May 25, 1945 ? ? ? ?Closing pharmacy CCM case at this time. Patient has clinic contact information for future questions or concerns.  ? ?[name], PharmD, [place], CPP ?Clinical Pharmacist ?Therapist, music at [employer] ?[phone] ? ?
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
## 2021-03-15 NOTE — Telephone Encounter (Signed)
Called patient no answer and mailbox was full ?
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
## 2021-03-15 NOTE — Patient Instructions (Addendum)
I am prescribing oxybutynin for the overactive bladder . Take it once daily in the evening ? ? The most common side effects are dry mouth and constipation  ? ?I am prescribing gabapentin to use for the burning pain in your leg.   ?  You can start with 100 mg at bedtime  and either increase the dose at bedtime up to 300 mg gradually OR:  you can add a morning and afternoon dose if needed ?The most common side effects are fluid retention and dizziness which usually only happens at doses higher than 100 mg   ? ? ?  ?

## 2021-03-15 NOTE — Assessment & Plan Note (Signed)
Continue once daily wellbutrin  ?

## 2021-03-15 NOTE — Assessment & Plan Note (Signed)
I have encouraged her to reduce her  BMI  with goal of 10% of body weigh over the next 6 months using a low glycemic index diet and regular exercise a minimum of 5 days per week.  ? ?

## 2021-03-15 NOTE — Assessment & Plan Note (Signed)
Continue sertraline  150 mg   Refilling clonazepam she is using 0.5 mg daily  ?

## 2021-03-15 NOTE — Assessment & Plan Note (Addendum)
Stable but present for over one year.  Nephrology referral advised, and deferred.  She is avoiding using NSAIDs due to gastric bypass history ? ?Lab Results  ?Component Value Date  ? CREATININE 1.07 03/15/2021  ? ?Lab Results  ?Component Value Date  ? NA 141 03/15/2021  ? K 3.8 03/15/2021  ? CL 102 03/15/2021  ? CO2 31 03/15/2021  ? ? ?

## 2021-03-16 ENCOUNTER — Telehealth: Payer: Self-pay

## 2021-03-16 DIAGNOSIS — S82891D Other fracture of right lower leg, subsequent encounter for closed fracture with routine healing: Secondary | ICD-10-CM | POA: Diagnosis not present

## 2021-03-16 NOTE — Telephone Encounter (Signed)
CALLED PATIENT NO ANSWER LEFT VOICEMAIL FOR A CALL BACK °Letter sent °

## 2021-03-18 LAB — URINE CULTURE
MICRO NUMBER:: 13109970
SPECIMEN QUALITY:: ADEQUATE

## 2021-03-19 ENCOUNTER — Telehealth: Payer: Self-pay

## 2021-03-19 NOTE — Telephone Encounter (Signed)
Pt returning call. Pt requesting callback.  °

## 2021-03-19 NOTE — Telephone Encounter (Signed)
Attempted to call pt. No answer. Mailbox full.  

## 2021-03-20 ENCOUNTER — Other Ambulatory Visit: Payer: Self-pay | Admitting: Internal Medicine

## 2021-03-20 ENCOUNTER — Encounter: Payer: Self-pay | Admitting: Internal Medicine

## 2021-03-20 DIAGNOSIS — B9620 Unspecified Escherichia coli [E. coli] as the cause of diseases classified elsewhere: Secondary | ICD-10-CM | POA: Insufficient documentation

## 2021-03-20 MED ORDER — CIPROFLOXACIN HCL 250 MG PO TABS: 250.0000 mg | ORAL_TABLET | Freq: Two times a day (BID) | ORAL | 0 refills | Status: AC

## 2021-03-23 NOTE — Addendum Note (Signed)
Addended by: [name] on: 03/23/2021 12:45 PM ? ? Modules accepted: Orders ? ?

## 2021-03-23 NOTE — Telephone Encounter (Signed)
See result note message 

## 2021-03-26 ENCOUNTER — Telehealth: Payer: Self-pay | Admitting: Internal Medicine

## 2021-03-26 DIAGNOSIS — F419 Anxiety disorder, unspecified: Secondary | ICD-10-CM

## 2021-03-26 NOTE — Telephone Encounter (Signed)
Patient called and said her granddaughter cleaned her room over the weekend and she threw out her bag that had her medication in, she thought it was trash. She has not had medication all weekend. She needs the following medications refilled; amitriptyline (ELAVIL) 50 MG tablet, atorvastatin (LIPITOR) 10 MG tablet, buPROPion (WELLBUTRIN SR) 150 MG 12 hr tablet, clonazePAM (KLONOPIN) 0.5 MG tablet, gabapentin (NEURONTIN) 100 MG capsule, losartan-hydrochlorothiazide (HYZAAR) 50-12.5 MG tablet, sertraline (ZOLOFT) 100 MG tablet, oxybutynin (DITROPAN-XL) 5 MG 24 hr tablet, and the antibiotic that was called in on 03/15/2020.  ?

## 2021-03-27 MED ORDER — ATORVASTATIN CALCIUM 10 MG PO TABS: 10.0000 mg | ORAL_TABLET | Freq: Every day | ORAL | 0 refills | Status: DC

## 2021-03-27 MED ORDER — AMITRIPTYLINE HCL 50 MG PO TABS: ORAL_TABLET | ORAL | 0 refills | Status: DC

## 2021-03-27 MED ORDER — OXYBUTYNIN CHLORIDE ER 5 MG PO TB24: 5.0000 mg | ORAL_TABLET | Freq: Every day | ORAL | 0 refills | Status: DC

## 2021-03-27 MED ORDER — BUPROPION HCL ER (SR) 150 MG PO TB12
150.0000 mg | ORAL_TABLET | Freq: Two times a day (BID) | ORAL | 0 refills | Status: DC
Start: 2021-03-27 — End: 2021-05-17

## 2021-03-27 MED ORDER — GABAPENTIN 100 MG PO CAPS: 100.0000 mg | ORAL_CAPSULE | Freq: Three times a day (TID) | ORAL | 0 refills | Status: DC

## 2021-03-27 MED ORDER — SERTRALINE HCL 100 MG PO TABS: ORAL_TABLET | ORAL | 0 refills | Status: DC

## 2021-03-27 MED ORDER — LOSARTAN POTASSIUM-HCTZ 50-12.5 MG PO TABS: 1.0000 | ORAL_TABLET | Freq: Every day | ORAL | 0 refills | Status: DC

## 2021-03-27 NOTE — Addendum Note (Signed)
Addended by: Adair Laundry on: 03/27/2021 09:50 AM ? ? Modules accepted: Orders ? ?

## 2021-03-27 NOTE — Telephone Encounter (Signed)
Pt's granddaughter threw her medications when cleaning out pt's room. Pt is requesting refills on all medications. I refilled everything but the Clonazepam and the Cipro.  ?

## 2021-03-28 ENCOUNTER — Other Ambulatory Visit: Payer: Self-pay | Admitting: Internal Medicine

## 2021-03-28 MED ORDER — CIPROFLOXACIN HCL 250 MG PO TABS: 250.0000 mg | ORAL_TABLET | Freq: Two times a day (BID) | ORAL | 0 refills | Status: AC

## 2021-03-28 NOTE — Telephone Encounter (Signed)
Pt called in two days ago and stated that her granddaughter threw her medications away when cleaning pt's room. Pt stated that she is unable to sleep without her clonazepam and did not get to finish the Cipro for her UTI. Pt is wanting to know if both of these medications can be called in. All other mediations have been refilled. ?

## 2021-03-28 NOTE — Telephone Encounter (Signed)
Patient called back requesting clonazepam stating she is unable to sleep without it. She stated is there anything else she can do for her bladder infection? She is drinking cranberry juice.  ?

## 2021-03-28 NOTE — Addendum Note (Signed)
Addended by: [name] on: 03/28/2021 04:48 PM ? ? Modules accepted: Orders ? ?

## 2021-03-29 NOTE — Assessment & Plan Note (Signed)
Clonazepam prescription was "lost".  Events suggestive of medication diversion by family member.  All refills have been cancelled.  ?

## 2021-03-29 NOTE — Telephone Encounter (Signed)
Noted  
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
## 2021-03-29 NOTE — Addendum Note (Signed)
Addended by: [name] on: 03/29/2021 01:53 PM ? ? Modules accepted: Orders ? ?

## 2021-04-09 ENCOUNTER — Other Ambulatory Visit: Payer: Self-pay

## 2021-04-09 MED ORDER — LOSARTAN POTASSIUM-HCTZ 50-12.5 MG PO TABS: 1.0000 | ORAL_TABLET | Freq: Every day | ORAL | 1 refills | Status: DC

## 2021-04-09 MED ORDER — OXYBUTYNIN CHLORIDE ER 5 MG PO TB24: 5.0000 mg | ORAL_TABLET | Freq: Every day | ORAL | 0 refills | Status: DC

## 2021-05-06 ENCOUNTER — Other Ambulatory Visit: Payer: Self-pay | Admitting: Internal Medicine

## 2021-05-17 ENCOUNTER — Other Ambulatory Visit: Payer: Self-pay | Admitting: Internal Medicine

## 2021-06-14 ENCOUNTER — Other Ambulatory Visit: Payer: Self-pay | Admitting: Internal Medicine

## 2021-06-27 ENCOUNTER — Ambulatory Visit: Payer: Self-pay | Admitting: *Deleted

## 2021-06-27 DIAGNOSIS — E1159 Type 2 diabetes mellitus with other circulatory complications: Secondary | ICD-10-CM

## 2021-06-27 DIAGNOSIS — E785 Hyperlipidemia, unspecified: Secondary | ICD-10-CM

## 2021-06-27 NOTE — Chronic Care Management (AMB) (Signed)
  Care Management   Follow Up Note   06/27/2021 Name: Jodi [name] MRN: [id number] DOB: [birth year]   Referred by: [name], MD Reason for referral : Case Closure   Third unsuccessful telephone outreach was attempted today. The patient was referred to the case management team for assistance with care management and care coordination. The patient's primary care provider has been notified of our unsuccessful attempts to make or maintain contact with the patient. The care management team is pleased to engage with this patient at any time in the future should he/she be interested in assistance from the care management team.    Follow Up Plan: No further follow up required: case being closed due to inability to maintain contact  [place], MSN RN Care Management Coordinator [place] [phone] [email or web address]

## 2021-07-18 ENCOUNTER — Other Ambulatory Visit: Payer: Self-pay | Admitting: Internal Medicine

## 2021-08-06 ENCOUNTER — Other Ambulatory Visit: Payer: Self-pay | Admitting: Internal Medicine

## 2021-08-13 ENCOUNTER — Other Ambulatory Visit: Payer: Self-pay | Admitting: Family

## 2021-08-13 ENCOUNTER — Other Ambulatory Visit: Payer: Self-pay

## 2021-08-13 MED ORDER — TRAMADOL HCL 50 MG PO TABS: 100.0000 mg | ORAL_TABLET | Freq: Three times a day (TID) | ORAL | 1 refills | Status: DC | PRN

## 2021-08-13 NOTE — Telephone Encounter (Signed)
Refilled: 03/28/2021 Last OV: 03/15/2021 Next OV: 09/19/2021

## 2021-08-13 NOTE — Telephone Encounter (Signed)
Patient states she has three days of traMADol (ULTRAM) 50 MG tablet left and would like to request a refill.  *Patient states her preferred pharmacy is [facility] in [place].

## 2021-08-17 ENCOUNTER — Telehealth: Payer: Self-pay | Admitting: Internal Medicine

## 2021-08-17 NOTE — Telephone Encounter (Signed)
Copied from [place] [phone]. Topic: Medicare AWV >> Aug 17, 2021 10:05 AM Devoria Glassing wrote: Reason for CRM: Left message for patient to schedule Annual Wellness Visit.  Please schedule with Nurse Health Advisor Denisa O'Brien-Blaney, LPN at [facility]. This appt can be telephone or office visit.  Please call [phone] ask for [facility]

## 2021-08-29 ENCOUNTER — Telehealth: Payer: Self-pay | Admitting: Internal Medicine

## 2021-08-29 NOTE — Telephone Encounter (Signed)
Copied from [place] [id number]. Topic: Medicare AWV >> Aug 29, 2021 11:30 AM Devoria Glassing wrote: Reason for CRM: Left message for patient to schedule Annual Wellness Visit.  Please schedule with Nurse Health Advisor Denisa O'Brien-Blaney, LPN at [facility]. This appt can be telephone or office visit.  Please call [phone] ask for [facility]

## 2021-09-06 ENCOUNTER — Telehealth: Payer: Self-pay | Admitting: Internal Medicine

## 2021-09-06 DIAGNOSIS — N1831 Chronic kidney disease, stage 3a: Secondary | ICD-10-CM

## 2021-09-06 DIAGNOSIS — Z8639 Personal history of other endocrine, nutritional and metabolic disease: Secondary | ICD-10-CM

## 2021-09-06 NOTE — Addendum Note (Signed)
Addended by: [name] on: 09/06/2021 02:57 PM   Modules accepted: Orders

## 2021-09-06 NOTE — Telephone Encounter (Signed)
Patient has a lab appt 09/14/2021, there are no orders in.

## 2021-09-07 NOTE — Telephone Encounter (Signed)
Labs have been ordered

## 2021-09-13 ENCOUNTER — Telehealth: Payer: Self-pay | Admitting: Internal Medicine

## 2021-09-13 NOTE — Telephone Encounter (Signed)
Copied from [place] [id number]. Topic: Medicare AWV >> Sep 13, 2021 11:28 AM Devoria Glassing wrote: Reason for CRM: Left message for patient to schedule Annual Wellness Visit.  Please schedule with Nurse Health Advisor Denisa O'Brien-Blaney, LPN at [facility]. This appt can be telephone or office visit.  Please call [phone] ask for [facility]

## 2021-09-14 ENCOUNTER — Other Ambulatory Visit: Payer: Medicare Other

## 2021-09-19 ENCOUNTER — Ambulatory Visit: Payer: Medicare Other | Admitting: Internal Medicine

## 2021-09-25 ENCOUNTER — Other Ambulatory Visit: Payer: Self-pay | Admitting: Internal Medicine

## 2021-09-25 ENCOUNTER — Ambulatory Visit (INDEPENDENT_AMBULATORY_CARE_PROVIDER_SITE_OTHER): Payer: Medicare HMO | Admitting: Internal Medicine

## 2021-09-25 ENCOUNTER — Encounter: Payer: Self-pay | Admitting: Internal Medicine

## 2021-09-25 VITALS — BP 136/64 | HR 70 | Temp 97.6°F | Ht 64.0 in | Wt 206.8 lb

## 2021-09-25 DIAGNOSIS — G629 Polyneuropathy, unspecified: Secondary | ICD-10-CM

## 2021-09-25 DIAGNOSIS — N1831 Chronic kidney disease, stage 3a: Secondary | ICD-10-CM

## 2021-09-25 DIAGNOSIS — I152 Hypertension secondary to endocrine disorders: Secondary | ICD-10-CM

## 2021-09-25 DIAGNOSIS — R7303 Prediabetes: Secondary | ICD-10-CM | POA: Diagnosis not present

## 2021-09-25 DIAGNOSIS — Z8639 Personal history of other endocrine, nutritional and metabolic disease: Secondary | ICD-10-CM

## 2021-09-25 DIAGNOSIS — D126 Benign neoplasm of colon, unspecified: Secondary | ICD-10-CM

## 2021-09-25 DIAGNOSIS — R69 Illness, unspecified: Secondary | ICD-10-CM | POA: Diagnosis not present

## 2021-09-25 DIAGNOSIS — R5383 Other fatigue: Secondary | ICD-10-CM | POA: Diagnosis not present

## 2021-09-25 DIAGNOSIS — H9193 Unspecified hearing loss, bilateral: Secondary | ICD-10-CM

## 2021-09-25 DIAGNOSIS — F32A Depression, unspecified: Secondary | ICD-10-CM

## 2021-09-25 DIAGNOSIS — F419 Anxiety disorder, unspecified: Secondary | ICD-10-CM

## 2021-09-25 DIAGNOSIS — R296 Repeated falls: Secondary | ICD-10-CM | POA: Diagnosis not present

## 2021-09-25 DIAGNOSIS — E1159 Type 2 diabetes mellitus with other circulatory complications: Secondary | ICD-10-CM

## 2021-09-25 LAB — COMPREHENSIVE METABOLIC PANEL
ALT: 10 U/L (ref 0–35)
AST: 14 U/L (ref 0–37)
Albumin: 3.8 g/dL (ref 3.5–5.2)
Alkaline Phosphatase: 104 U/L (ref 39–117)
BUN: 16 mg/dL (ref 6–23)
CO2: 30 mEq/L (ref 19–32)
Calcium: 8.8 mg/dL (ref 8.4–10.5)
Chloride: 102 mEq/L (ref 96–112)
Creatinine, Ser: 1.19 mg/dL (ref 0.40–1.20)
GFR: 44.91 mL/min — ABNORMAL LOW (ref >60.00)
Glucose, Bld: 82 mg/dL (ref 70–99)
Potassium: 3.9 mEq/L (ref 3.5–5.1)
Sodium: 139 mEq/L (ref 135–145)
Total Bilirubin: 0.6 mg/dL (ref 0.2–1.2)
Total Protein: 5.8 g/dL — ABNORMAL LOW (ref 6.0–8.3)

## 2021-09-25 LAB — CBC WITH DIFFERENTIAL/PLATELET
Basophils Absolute: 0 10*3/uL (ref 0.0–0.1)
Basophils Relative: 0.8 % (ref 0.0–3.0)
Eosinophils Absolute: 0.1 10*3/uL (ref 0.0–0.7)
Eosinophils Relative: 1.3 % (ref 0.0–5.0)
HCT: 38.3 % (ref 36.0–46.0)
Hemoglobin: 12.7 g/dL (ref 12.0–15.0)
Lymphocytes Relative: 33.3 % (ref 12.0–46.0)
Lymphs Abs: 1.7 10*3/uL (ref 0.7–4.0)
MCHC: 33.1 g/dL (ref 30.0–36.0)
MCV: 90.3 fl (ref 78.0–100.0)
Monocytes Absolute: 0.5 10*3/uL (ref 0.1–1.0)
Monocytes Relative: 9.9 % (ref 3.0–12.0)
Neutro Abs: 2.8 10*3/uL (ref 1.4–7.7)
Neutrophils Relative %: 54.7 % (ref 43.0–77.0)
Platelets: 223 10*3/uL (ref 150.0–400.0)
RBC: 4.24 Mil/uL (ref 3.87–5.11)
RDW: 13.6 % (ref 11.5–15.5)
WBC: 5.1 10*3/uL (ref 4.0–10.5)

## 2021-09-25 LAB — B12 AND FOLATE PANEL
Folate: 9.9 ng/mL (ref >5.9)
Vitamin B-12: 165 pg/mL — ABNORMAL LOW (ref 211–911)

## 2021-09-25 LAB — HEMOGLOBIN A1C: Hgb A1c MFr Bld: 6 % (ref 4.6–6.5)

## 2021-09-25 NOTE — Assessment & Plan Note (Signed)
She was referred to [facility] in March for colonoscopy and has not returned their calls.

## 2021-09-25 NOTE — Assessment & Plan Note (Signed)
Checking  B12 and folate.  Home Health referral made for PT eval and treat
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
## 2021-09-25 NOTE — Assessment & Plan Note (Addendum)
She was referred to Nephrology in March for declining GFR but has not been contacted.  She is avoiding NSAIDS.

## 2021-09-25 NOTE — Patient Instructions (Signed)
Sugar free jello  Dannon n  Fit yogurt ([employer]  version)   Apples,  cherries, strawberries,   blueberries: ok to eat daily  Exercise is crucial   PT referral through home health

## 2021-09-25 NOTE — Assessment & Plan Note (Signed)
Now prediabetic based on A1c of 6.1.  Diet is high in concentrated sugars and starches  .  counselling given

## 2021-09-25 NOTE — Assessment & Plan Note (Signed)
Her positive depression screen was addressed today .  She declines psychiatry referral.  She is taking 150 mg twice daily  wellbutrin and q 150 mg daily  sertraline

## 2021-09-25 NOTE — Assessment & Plan Note (Signed)
Clonazepam use reviewed,  She is taking 1/2 tablet twice daily .

## 2021-09-27 DIAGNOSIS — R69 Illness, unspecified: Secondary | ICD-10-CM | POA: Diagnosis not present

## 2021-09-27 DIAGNOSIS — H9193 Unspecified hearing loss, bilateral: Secondary | ICD-10-CM | POA: Diagnosis not present

## 2021-09-27 DIAGNOSIS — Z79899 Other long term (current) drug therapy: Secondary | ICD-10-CM | POA: Diagnosis not present

## 2021-09-27 DIAGNOSIS — E1159 Type 2 diabetes mellitus with other circulatory complications: Secondary | ICD-10-CM | POA: Diagnosis not present

## 2021-09-27 DIAGNOSIS — E1122 Type 2 diabetes mellitus with diabetic chronic kidney disease: Secondary | ICD-10-CM | POA: Diagnosis not present

## 2021-09-27 DIAGNOSIS — F329 Major depressive disorder, single episode, unspecified: Secondary | ICD-10-CM | POA: Diagnosis not present

## 2021-09-27 DIAGNOSIS — E669 Obesity, unspecified: Secondary | ICD-10-CM | POA: Diagnosis not present

## 2021-09-27 DIAGNOSIS — I152 Hypertension secondary to endocrine disorders: Secondary | ICD-10-CM | POA: Diagnosis not present

## 2021-09-27 DIAGNOSIS — E1140 Type 2 diabetes mellitus with diabetic neuropathy, unspecified: Secondary | ICD-10-CM | POA: Diagnosis not present

## 2021-09-27 DIAGNOSIS — Z96651 Presence of right artificial knee joint: Secondary | ICD-10-CM | POA: Diagnosis not present

## 2021-09-27 DIAGNOSIS — Z6830 Body mass index (BMI) 30.0-30.9, adult: Secondary | ICD-10-CM | POA: Diagnosis not present

## 2021-09-27 DIAGNOSIS — Z96642 Presence of left artificial hip joint: Secondary | ICD-10-CM | POA: Diagnosis not present

## 2021-09-27 DIAGNOSIS — G8929 Other chronic pain: Secondary | ICD-10-CM | POA: Diagnosis not present

## 2021-09-27 DIAGNOSIS — M545 Low back pain, unspecified: Secondary | ICD-10-CM | POA: Diagnosis not present

## 2021-09-27 DIAGNOSIS — I10 Essential (primary) hypertension: Secondary | ICD-10-CM | POA: Diagnosis not present

## 2021-09-27 DIAGNOSIS — N1831 Chronic kidney disease, stage 3a: Secondary | ICD-10-CM | POA: Diagnosis not present

## 2021-09-27 DIAGNOSIS — Z9181 History of falling: Secondary | ICD-10-CM | POA: Diagnosis not present

## 2021-09-27 DIAGNOSIS — D126 Benign neoplasm of colon, unspecified: Secondary | ICD-10-CM | POA: Diagnosis not present

## 2021-09-28 ENCOUNTER — Other Ambulatory Visit: Payer: Self-pay

## 2021-09-28 NOTE — Addendum Note (Signed)
Addended by: [name] on: 09/28/2021 12:57 PM   Modules accepted: Orders

## 2021-10-01 DIAGNOSIS — H9193 Unspecified hearing loss, bilateral: Secondary | ICD-10-CM | POA: Diagnosis not present

## 2021-10-01 DIAGNOSIS — D126 Benign neoplasm of colon, unspecified: Secondary | ICD-10-CM | POA: Diagnosis not present

## 2021-10-01 DIAGNOSIS — M545 Low back pain, unspecified: Secondary | ICD-10-CM | POA: Diagnosis not present

## 2021-10-01 DIAGNOSIS — F329 Major depressive disorder, single episode, unspecified: Secondary | ICD-10-CM | POA: Diagnosis not present

## 2021-10-01 DIAGNOSIS — Z9181 History of falling: Secondary | ICD-10-CM | POA: Diagnosis not present

## 2021-10-01 DIAGNOSIS — E1122 Type 2 diabetes mellitus with diabetic chronic kidney disease: Secondary | ICD-10-CM | POA: Diagnosis not present

## 2021-10-01 DIAGNOSIS — N1831 Chronic kidney disease, stage 3a: Secondary | ICD-10-CM | POA: Diagnosis not present

## 2021-10-01 DIAGNOSIS — G8929 Other chronic pain: Secondary | ICD-10-CM | POA: Diagnosis not present

## 2021-10-01 DIAGNOSIS — R69 Illness, unspecified: Secondary | ICD-10-CM | POA: Diagnosis not present

## 2021-10-01 DIAGNOSIS — E1159 Type 2 diabetes mellitus with other circulatory complications: Secondary | ICD-10-CM | POA: Diagnosis not present

## 2021-10-01 DIAGNOSIS — Z96651 Presence of right artificial knee joint: Secondary | ICD-10-CM | POA: Diagnosis not present

## 2021-10-01 DIAGNOSIS — E1140 Type 2 diabetes mellitus with diabetic neuropathy, unspecified: Secondary | ICD-10-CM | POA: Diagnosis not present

## 2021-10-01 DIAGNOSIS — Z96642 Presence of left artificial hip joint: Secondary | ICD-10-CM | POA: Diagnosis not present

## 2021-10-01 DIAGNOSIS — E669 Obesity, unspecified: Secondary | ICD-10-CM | POA: Diagnosis not present

## 2021-10-01 DIAGNOSIS — Z6830 Body mass index (BMI) 30.0-30.9, adult: Secondary | ICD-10-CM | POA: Diagnosis not present

## 2021-10-01 DIAGNOSIS — Z79899 Other long term (current) drug therapy: Secondary | ICD-10-CM | POA: Diagnosis not present

## 2021-10-01 DIAGNOSIS — I152 Hypertension secondary to endocrine disorders: Secondary | ICD-10-CM | POA: Diagnosis not present

## 2021-10-03 DIAGNOSIS — H9193 Unspecified hearing loss, bilateral: Secondary | ICD-10-CM | POA: Diagnosis not present

## 2021-10-03 DIAGNOSIS — G8929 Other chronic pain: Secondary | ICD-10-CM | POA: Diagnosis not present

## 2021-10-03 DIAGNOSIS — Z9181 History of falling: Secondary | ICD-10-CM | POA: Diagnosis not present

## 2021-10-03 DIAGNOSIS — F329 Major depressive disorder, single episode, unspecified: Secondary | ICD-10-CM | POA: Diagnosis not present

## 2021-10-03 DIAGNOSIS — Z6830 Body mass index (BMI) 30.0-30.9, adult: Secondary | ICD-10-CM | POA: Diagnosis not present

## 2021-10-03 DIAGNOSIS — Z79899 Other long term (current) drug therapy: Secondary | ICD-10-CM | POA: Diagnosis not present

## 2021-10-03 DIAGNOSIS — E669 Obesity, unspecified: Secondary | ICD-10-CM | POA: Diagnosis not present

## 2021-10-03 DIAGNOSIS — D126 Benign neoplasm of colon, unspecified: Secondary | ICD-10-CM | POA: Diagnosis not present

## 2021-10-03 DIAGNOSIS — M545 Low back pain, unspecified: Secondary | ICD-10-CM | POA: Diagnosis not present

## 2021-10-03 DIAGNOSIS — E1159 Type 2 diabetes mellitus with other circulatory complications: Secondary | ICD-10-CM | POA: Diagnosis not present

## 2021-10-03 DIAGNOSIS — Z96651 Presence of right artificial knee joint: Secondary | ICD-10-CM | POA: Diagnosis not present

## 2021-10-03 DIAGNOSIS — R69 Illness, unspecified: Secondary | ICD-10-CM | POA: Diagnosis not present

## 2021-10-03 DIAGNOSIS — N1831 Chronic kidney disease, stage 3a: Secondary | ICD-10-CM | POA: Diagnosis not present

## 2021-10-03 DIAGNOSIS — E1122 Type 2 diabetes mellitus with diabetic chronic kidney disease: Secondary | ICD-10-CM | POA: Diagnosis not present

## 2021-10-03 DIAGNOSIS — E1140 Type 2 diabetes mellitus with diabetic neuropathy, unspecified: Secondary | ICD-10-CM | POA: Diagnosis not present

## 2021-10-03 DIAGNOSIS — Z96642 Presence of left artificial hip joint: Secondary | ICD-10-CM | POA: Diagnosis not present

## 2021-10-03 DIAGNOSIS — I152 Hypertension secondary to endocrine disorders: Secondary | ICD-10-CM | POA: Diagnosis not present

## 2021-10-04 ENCOUNTER — Other Ambulatory Visit: Payer: Self-pay | Admitting: Internal Medicine

## 2021-10-05 ENCOUNTER — Other Ambulatory Visit (INDEPENDENT_AMBULATORY_CARE_PROVIDER_SITE_OTHER): Payer: Medicare HMO

## 2021-10-05 DIAGNOSIS — N1831 Chronic kidney disease, stage 3a: Secondary | ICD-10-CM

## 2021-10-05 DIAGNOSIS — Z23 Encounter for immunization: Secondary | ICD-10-CM | POA: Diagnosis not present

## 2021-10-05 LAB — BASIC METABOLIC PANEL
BUN: 21 mg/dL (ref 6–23)
CO2: 31 mEq/L (ref 19–32)
Calcium: 8.8 mg/dL (ref 8.4–10.5)
Chloride: 103 mEq/L (ref 96–112)
Creatinine, Ser: 1.18 mg/dL (ref 0.40–1.20)
GFR: 45.36 mL/min — ABNORMAL LOW (ref >60.00)
Glucose, Bld: 90 mg/dL (ref 70–99)
Potassium: 3.7 mEq/L (ref 3.5–5.1)
Sodium: 142 mEq/L (ref 135–145)

## 2021-10-07 DIAGNOSIS — E1159 Type 2 diabetes mellitus with other circulatory complications: Secondary | ICD-10-CM | POA: Diagnosis not present

## 2021-10-07 DIAGNOSIS — F411 Generalized anxiety disorder: Secondary | ICD-10-CM

## 2021-10-07 DIAGNOSIS — D126 Benign neoplasm of colon, unspecified: Secondary | ICD-10-CM | POA: Diagnosis not present

## 2021-10-07 DIAGNOSIS — R69 Illness, unspecified: Secondary | ICD-10-CM | POA: Diagnosis not present

## 2021-10-07 DIAGNOSIS — Z79899 Other long term (current) drug therapy: Secondary | ICD-10-CM

## 2021-10-07 DIAGNOSIS — E1122 Type 2 diabetes mellitus with diabetic chronic kidney disease: Secondary | ICD-10-CM | POA: Diagnosis not present

## 2021-10-07 DIAGNOSIS — E669 Obesity, unspecified: Secondary | ICD-10-CM | POA: Diagnosis not present

## 2021-10-07 DIAGNOSIS — N1831 Chronic kidney disease, stage 3a: Secondary | ICD-10-CM | POA: Diagnosis not present

## 2021-10-07 DIAGNOSIS — Z96651 Presence of right artificial knee joint: Secondary | ICD-10-CM

## 2021-10-07 DIAGNOSIS — G8929 Other chronic pain: Secondary | ICD-10-CM | POA: Diagnosis not present

## 2021-10-07 DIAGNOSIS — Z96642 Presence of left artificial hip joint: Secondary | ICD-10-CM

## 2021-10-07 DIAGNOSIS — H9193 Unspecified hearing loss, bilateral: Secondary | ICD-10-CM | POA: Diagnosis not present

## 2021-10-07 DIAGNOSIS — I152 Hypertension secondary to endocrine disorders: Secondary | ICD-10-CM | POA: Diagnosis not present

## 2021-10-07 DIAGNOSIS — E1140 Type 2 diabetes mellitus with diabetic neuropathy, unspecified: Secondary | ICD-10-CM | POA: Diagnosis not present

## 2021-10-07 DIAGNOSIS — Z9181 History of falling: Secondary | ICD-10-CM

## 2021-10-07 DIAGNOSIS — M545 Low back pain, unspecified: Secondary | ICD-10-CM | POA: Diagnosis not present

## 2021-10-07 DIAGNOSIS — F329 Major depressive disorder, single episode, unspecified: Secondary | ICD-10-CM | POA: Diagnosis not present

## 2021-10-07 DIAGNOSIS — Z6830 Body mass index (BMI) 30.0-30.9, adult: Secondary | ICD-10-CM

## 2021-10-08 DIAGNOSIS — M545 Low back pain, unspecified: Secondary | ICD-10-CM | POA: Diagnosis not present

## 2021-10-08 DIAGNOSIS — Z79899 Other long term (current) drug therapy: Secondary | ICD-10-CM | POA: Diagnosis not present

## 2021-10-08 DIAGNOSIS — E1140 Type 2 diabetes mellitus with diabetic neuropathy, unspecified: Secondary | ICD-10-CM | POA: Diagnosis not present

## 2021-10-08 DIAGNOSIS — D126 Benign neoplasm of colon, unspecified: Secondary | ICD-10-CM | POA: Diagnosis not present

## 2021-10-08 DIAGNOSIS — Z96651 Presence of right artificial knee joint: Secondary | ICD-10-CM | POA: Diagnosis not present

## 2021-10-08 DIAGNOSIS — N1831 Chronic kidney disease, stage 3a: Secondary | ICD-10-CM | POA: Diagnosis not present

## 2021-10-08 DIAGNOSIS — Z6830 Body mass index (BMI) 30.0-30.9, adult: Secondary | ICD-10-CM | POA: Diagnosis not present

## 2021-10-08 DIAGNOSIS — E1122 Type 2 diabetes mellitus with diabetic chronic kidney disease: Secondary | ICD-10-CM | POA: Diagnosis not present

## 2021-10-08 DIAGNOSIS — H9193 Unspecified hearing loss, bilateral: Secondary | ICD-10-CM | POA: Diagnosis not present

## 2021-10-08 DIAGNOSIS — R69 Illness, unspecified: Secondary | ICD-10-CM | POA: Diagnosis not present

## 2021-10-08 DIAGNOSIS — G8929 Other chronic pain: Secondary | ICD-10-CM | POA: Diagnosis not present

## 2021-10-08 DIAGNOSIS — E1159 Type 2 diabetes mellitus with other circulatory complications: Secondary | ICD-10-CM | POA: Diagnosis not present

## 2021-10-08 DIAGNOSIS — F329 Major depressive disorder, single episode, unspecified: Secondary | ICD-10-CM | POA: Diagnosis not present

## 2021-10-08 DIAGNOSIS — Z96642 Presence of left artificial hip joint: Secondary | ICD-10-CM | POA: Diagnosis not present

## 2021-10-08 DIAGNOSIS — E669 Obesity, unspecified: Secondary | ICD-10-CM | POA: Diagnosis not present

## 2021-10-08 DIAGNOSIS — Z9181 History of falling: Secondary | ICD-10-CM | POA: Diagnosis not present

## 2021-10-08 DIAGNOSIS — I152 Hypertension secondary to endocrine disorders: Secondary | ICD-10-CM | POA: Diagnosis not present

## 2021-10-09 NOTE — Addendum Note (Signed)
Addended by: [name] on: 10/09/2021 06:42 AM   Modules accepted: Orders

## 2021-10-11 DIAGNOSIS — Z6830 Body mass index (BMI) 30.0-30.9, adult: Secondary | ICD-10-CM | POA: Diagnosis not present

## 2021-10-11 DIAGNOSIS — Z9181 History of falling: Secondary | ICD-10-CM | POA: Diagnosis not present

## 2021-10-11 DIAGNOSIS — D126 Benign neoplasm of colon, unspecified: Secondary | ICD-10-CM | POA: Diagnosis not present

## 2021-10-11 DIAGNOSIS — I152 Hypertension secondary to endocrine disorders: Secondary | ICD-10-CM | POA: Diagnosis not present

## 2021-10-11 DIAGNOSIS — E1140 Type 2 diabetes mellitus with diabetic neuropathy, unspecified: Secondary | ICD-10-CM | POA: Diagnosis not present

## 2021-10-11 DIAGNOSIS — M545 Low back pain, unspecified: Secondary | ICD-10-CM | POA: Diagnosis not present

## 2021-10-11 DIAGNOSIS — E669 Obesity, unspecified: Secondary | ICD-10-CM | POA: Diagnosis not present

## 2021-10-11 DIAGNOSIS — G8929 Other chronic pain: Secondary | ICD-10-CM | POA: Diagnosis not present

## 2021-10-11 DIAGNOSIS — H9193 Unspecified hearing loss, bilateral: Secondary | ICD-10-CM | POA: Diagnosis not present

## 2021-10-11 DIAGNOSIS — N1831 Chronic kidney disease, stage 3a: Secondary | ICD-10-CM | POA: Diagnosis not present

## 2021-10-11 DIAGNOSIS — F329 Major depressive disorder, single episode, unspecified: Secondary | ICD-10-CM | POA: Diagnosis not present

## 2021-10-11 DIAGNOSIS — R69 Illness, unspecified: Secondary | ICD-10-CM | POA: Diagnosis not present

## 2021-10-11 DIAGNOSIS — Z96642 Presence of left artificial hip joint: Secondary | ICD-10-CM | POA: Diagnosis not present

## 2021-10-11 DIAGNOSIS — Z79899 Other long term (current) drug therapy: Secondary | ICD-10-CM | POA: Diagnosis not present

## 2021-10-11 DIAGNOSIS — E1159 Type 2 diabetes mellitus with other circulatory complications: Secondary | ICD-10-CM | POA: Diagnosis not present

## 2021-10-11 DIAGNOSIS — Z96651 Presence of right artificial knee joint: Secondary | ICD-10-CM | POA: Diagnosis not present

## 2021-10-11 DIAGNOSIS — E1122 Type 2 diabetes mellitus with diabetic chronic kidney disease: Secondary | ICD-10-CM | POA: Diagnosis not present

## 2021-10-13 ENCOUNTER — Other Ambulatory Visit: Payer: Self-pay | Admitting: Internal Medicine

## 2021-10-15 DIAGNOSIS — Z96642 Presence of left artificial hip joint: Secondary | ICD-10-CM | POA: Diagnosis not present

## 2021-10-15 DIAGNOSIS — R69 Illness, unspecified: Secondary | ICD-10-CM | POA: Diagnosis not present

## 2021-10-15 DIAGNOSIS — F329 Major depressive disorder, single episode, unspecified: Secondary | ICD-10-CM | POA: Diagnosis not present

## 2021-10-15 DIAGNOSIS — H9193 Unspecified hearing loss, bilateral: Secondary | ICD-10-CM | POA: Diagnosis not present

## 2021-10-15 DIAGNOSIS — Z6830 Body mass index (BMI) 30.0-30.9, adult: Secondary | ICD-10-CM | POA: Diagnosis not present

## 2021-10-15 DIAGNOSIS — M545 Low back pain, unspecified: Secondary | ICD-10-CM | POA: Diagnosis not present

## 2021-10-15 DIAGNOSIS — Z79899 Other long term (current) drug therapy: Secondary | ICD-10-CM | POA: Diagnosis not present

## 2021-10-15 DIAGNOSIS — N1831 Chronic kidney disease, stage 3a: Secondary | ICD-10-CM | POA: Diagnosis not present

## 2021-10-15 DIAGNOSIS — E1140 Type 2 diabetes mellitus with diabetic neuropathy, unspecified: Secondary | ICD-10-CM | POA: Diagnosis not present

## 2021-10-15 DIAGNOSIS — G8929 Other chronic pain: Secondary | ICD-10-CM | POA: Diagnosis not present

## 2021-10-15 DIAGNOSIS — Z96651 Presence of right artificial knee joint: Secondary | ICD-10-CM | POA: Diagnosis not present

## 2021-10-15 DIAGNOSIS — D126 Benign neoplasm of colon, unspecified: Secondary | ICD-10-CM | POA: Diagnosis not present

## 2021-10-15 DIAGNOSIS — Z9181 History of falling: Secondary | ICD-10-CM | POA: Diagnosis not present

## 2021-10-15 DIAGNOSIS — E669 Obesity, unspecified: Secondary | ICD-10-CM | POA: Diagnosis not present

## 2021-10-15 DIAGNOSIS — E1159 Type 2 diabetes mellitus with other circulatory complications: Secondary | ICD-10-CM | POA: Diagnosis not present

## 2021-10-15 DIAGNOSIS — E1122 Type 2 diabetes mellitus with diabetic chronic kidney disease: Secondary | ICD-10-CM | POA: Diagnosis not present

## 2021-10-15 DIAGNOSIS — I152 Hypertension secondary to endocrine disorders: Secondary | ICD-10-CM | POA: Diagnosis not present

## 2021-10-19 DIAGNOSIS — R69 Illness, unspecified: Secondary | ICD-10-CM | POA: Diagnosis not present

## 2021-10-19 DIAGNOSIS — I152 Hypertension secondary to endocrine disorders: Secondary | ICD-10-CM | POA: Diagnosis not present

## 2021-10-19 DIAGNOSIS — G8929 Other chronic pain: Secondary | ICD-10-CM | POA: Diagnosis not present

## 2021-10-19 DIAGNOSIS — Z6830 Body mass index (BMI) 30.0-30.9, adult: Secondary | ICD-10-CM | POA: Diagnosis not present

## 2021-10-19 DIAGNOSIS — D126 Benign neoplasm of colon, unspecified: Secondary | ICD-10-CM | POA: Diagnosis not present

## 2021-10-19 DIAGNOSIS — E1122 Type 2 diabetes mellitus with diabetic chronic kidney disease: Secondary | ICD-10-CM | POA: Diagnosis not present

## 2021-10-19 DIAGNOSIS — Z9181 History of falling: Secondary | ICD-10-CM | POA: Diagnosis not present

## 2021-10-19 DIAGNOSIS — H9193 Unspecified hearing loss, bilateral: Secondary | ICD-10-CM | POA: Diagnosis not present

## 2021-10-19 DIAGNOSIS — N1831 Chronic kidney disease, stage 3a: Secondary | ICD-10-CM | POA: Diagnosis not present

## 2021-10-19 DIAGNOSIS — Z96642 Presence of left artificial hip joint: Secondary | ICD-10-CM | POA: Diagnosis not present

## 2021-10-19 DIAGNOSIS — F329 Major depressive disorder, single episode, unspecified: Secondary | ICD-10-CM | POA: Diagnosis not present

## 2021-10-19 DIAGNOSIS — Z79899 Other long term (current) drug therapy: Secondary | ICD-10-CM | POA: Diagnosis not present

## 2021-10-19 DIAGNOSIS — M545 Low back pain, unspecified: Secondary | ICD-10-CM | POA: Diagnosis not present

## 2021-10-19 DIAGNOSIS — Z96651 Presence of right artificial knee joint: Secondary | ICD-10-CM | POA: Diagnosis not present

## 2021-10-19 DIAGNOSIS — E669 Obesity, unspecified: Secondary | ICD-10-CM | POA: Diagnosis not present

## 2021-10-19 DIAGNOSIS — E1140 Type 2 diabetes mellitus with diabetic neuropathy, unspecified: Secondary | ICD-10-CM | POA: Diagnosis not present

## 2021-10-19 DIAGNOSIS — E1159 Type 2 diabetes mellitus with other circulatory complications: Secondary | ICD-10-CM | POA: Diagnosis not present

## 2021-10-23 DIAGNOSIS — Z96642 Presence of left artificial hip joint: Secondary | ICD-10-CM | POA: Diagnosis not present

## 2021-10-23 DIAGNOSIS — Z6830 Body mass index (BMI) 30.0-30.9, adult: Secondary | ICD-10-CM | POA: Diagnosis not present

## 2021-10-23 DIAGNOSIS — G8929 Other chronic pain: Secondary | ICD-10-CM | POA: Diagnosis not present

## 2021-10-23 DIAGNOSIS — Z79899 Other long term (current) drug therapy: Secondary | ICD-10-CM | POA: Diagnosis not present

## 2021-10-23 DIAGNOSIS — D126 Benign neoplasm of colon, unspecified: Secondary | ICD-10-CM | POA: Diagnosis not present

## 2021-10-23 DIAGNOSIS — E669 Obesity, unspecified: Secondary | ICD-10-CM | POA: Diagnosis not present

## 2021-10-23 DIAGNOSIS — Z9181 History of falling: Secondary | ICD-10-CM | POA: Diagnosis not present

## 2021-10-23 DIAGNOSIS — E1140 Type 2 diabetes mellitus with diabetic neuropathy, unspecified: Secondary | ICD-10-CM | POA: Diagnosis not present

## 2021-10-23 DIAGNOSIS — E1159 Type 2 diabetes mellitus with other circulatory complications: Secondary | ICD-10-CM | POA: Diagnosis not present

## 2021-10-23 DIAGNOSIS — M545 Low back pain, unspecified: Secondary | ICD-10-CM | POA: Diagnosis not present

## 2021-10-23 DIAGNOSIS — R69 Illness, unspecified: Secondary | ICD-10-CM | POA: Diagnosis not present

## 2021-10-23 DIAGNOSIS — Z96651 Presence of right artificial knee joint: Secondary | ICD-10-CM | POA: Diagnosis not present

## 2021-10-23 DIAGNOSIS — F329 Major depressive disorder, single episode, unspecified: Secondary | ICD-10-CM | POA: Diagnosis not present

## 2021-10-23 DIAGNOSIS — E1122 Type 2 diabetes mellitus with diabetic chronic kidney disease: Secondary | ICD-10-CM | POA: Diagnosis not present

## 2021-10-23 DIAGNOSIS — H9193 Unspecified hearing loss, bilateral: Secondary | ICD-10-CM | POA: Diagnosis not present

## 2021-10-23 DIAGNOSIS — N1831 Chronic kidney disease, stage 3a: Secondary | ICD-10-CM | POA: Diagnosis not present

## 2021-10-23 DIAGNOSIS — I152 Hypertension secondary to endocrine disorders: Secondary | ICD-10-CM | POA: Diagnosis not present

## 2021-10-24 DIAGNOSIS — H9193 Unspecified hearing loss, bilateral: Secondary | ICD-10-CM | POA: Diagnosis not present

## 2021-10-24 DIAGNOSIS — F329 Major depressive disorder, single episode, unspecified: Secondary | ICD-10-CM | POA: Diagnosis not present

## 2021-10-24 DIAGNOSIS — E1159 Type 2 diabetes mellitus with other circulatory complications: Secondary | ICD-10-CM | POA: Diagnosis not present

## 2021-10-24 DIAGNOSIS — D126 Benign neoplasm of colon, unspecified: Secondary | ICD-10-CM | POA: Diagnosis not present

## 2021-10-24 DIAGNOSIS — E1140 Type 2 diabetes mellitus with diabetic neuropathy, unspecified: Secondary | ICD-10-CM | POA: Diagnosis not present

## 2021-10-24 DIAGNOSIS — Z79899 Other long term (current) drug therapy: Secondary | ICD-10-CM | POA: Diagnosis not present

## 2021-10-24 DIAGNOSIS — Z6830 Body mass index (BMI) 30.0-30.9, adult: Secondary | ICD-10-CM | POA: Diagnosis not present

## 2021-10-24 DIAGNOSIS — E669 Obesity, unspecified: Secondary | ICD-10-CM | POA: Diagnosis not present

## 2021-10-24 DIAGNOSIS — M545 Low back pain, unspecified: Secondary | ICD-10-CM | POA: Diagnosis not present

## 2021-10-24 DIAGNOSIS — G8929 Other chronic pain: Secondary | ICD-10-CM | POA: Diagnosis not present

## 2021-10-24 DIAGNOSIS — I152 Hypertension secondary to endocrine disorders: Secondary | ICD-10-CM | POA: Diagnosis not present

## 2021-10-24 DIAGNOSIS — Z9181 History of falling: Secondary | ICD-10-CM | POA: Diagnosis not present

## 2021-10-24 DIAGNOSIS — Z96651 Presence of right artificial knee joint: Secondary | ICD-10-CM | POA: Diagnosis not present

## 2021-10-24 DIAGNOSIS — Z96642 Presence of left artificial hip joint: Secondary | ICD-10-CM | POA: Diagnosis not present

## 2021-10-24 DIAGNOSIS — R69 Illness, unspecified: Secondary | ICD-10-CM | POA: Diagnosis not present

## 2021-10-24 DIAGNOSIS — E1122 Type 2 diabetes mellitus with diabetic chronic kidney disease: Secondary | ICD-10-CM | POA: Diagnosis not present

## 2021-10-24 DIAGNOSIS — N1831 Chronic kidney disease, stage 3a: Secondary | ICD-10-CM | POA: Diagnosis not present

## 2021-10-29 DIAGNOSIS — E1140 Type 2 diabetes mellitus with diabetic neuropathy, unspecified: Secondary | ICD-10-CM | POA: Diagnosis not present

## 2021-10-29 DIAGNOSIS — Z9181 History of falling: Secondary | ICD-10-CM | POA: Diagnosis not present

## 2021-10-29 DIAGNOSIS — Z79899 Other long term (current) drug therapy: Secondary | ICD-10-CM | POA: Diagnosis not present

## 2021-10-29 DIAGNOSIS — N1831 Chronic kidney disease, stage 3a: Secondary | ICD-10-CM | POA: Diagnosis not present

## 2021-10-29 DIAGNOSIS — E1122 Type 2 diabetes mellitus with diabetic chronic kidney disease: Secondary | ICD-10-CM | POA: Diagnosis not present

## 2021-10-29 DIAGNOSIS — Z6830 Body mass index (BMI) 30.0-30.9, adult: Secondary | ICD-10-CM | POA: Diagnosis not present

## 2021-10-29 DIAGNOSIS — I152 Hypertension secondary to endocrine disorders: Secondary | ICD-10-CM | POA: Diagnosis not present

## 2021-10-29 DIAGNOSIS — M545 Low back pain, unspecified: Secondary | ICD-10-CM | POA: Diagnosis not present

## 2021-10-29 DIAGNOSIS — R69 Illness, unspecified: Secondary | ICD-10-CM | POA: Diagnosis not present

## 2021-10-29 DIAGNOSIS — H9193 Unspecified hearing loss, bilateral: Secondary | ICD-10-CM | POA: Diagnosis not present

## 2021-10-29 DIAGNOSIS — E1159 Type 2 diabetes mellitus with other circulatory complications: Secondary | ICD-10-CM | POA: Diagnosis not present

## 2021-10-29 DIAGNOSIS — F329 Major depressive disorder, single episode, unspecified: Secondary | ICD-10-CM | POA: Diagnosis not present

## 2021-10-29 DIAGNOSIS — Z96642 Presence of left artificial hip joint: Secondary | ICD-10-CM | POA: Diagnosis not present

## 2021-10-29 DIAGNOSIS — G8929 Other chronic pain: Secondary | ICD-10-CM | POA: Diagnosis not present

## 2021-10-29 DIAGNOSIS — E669 Obesity, unspecified: Secondary | ICD-10-CM | POA: Diagnosis not present

## 2021-10-29 DIAGNOSIS — Z96651 Presence of right artificial knee joint: Secondary | ICD-10-CM | POA: Diagnosis not present

## 2021-10-29 DIAGNOSIS — D126 Benign neoplasm of colon, unspecified: Secondary | ICD-10-CM | POA: Diagnosis not present

## 2021-10-30 ENCOUNTER — Ambulatory Visit (INDEPENDENT_AMBULATORY_CARE_PROVIDER_SITE_OTHER): Payer: Medicare HMO

## 2021-10-30 VITALS — Ht 64.0 in | Wt 206.0 lb

## 2021-10-30 DIAGNOSIS — Z0000 Encounter for general adult medical examination without abnormal findings: Secondary | ICD-10-CM

## 2021-10-30 NOTE — Progress Notes (Cosign Needed Addendum)
Subjective:   Jodi [name] is a 76 y.o. female who presents for Medicare Annual (Subsequent) preventive examination.  Review of Systems    No ROS.  Medicare Wellness Virtual Visit.  Visual/audio telehealth visit, UTA vital signs.   See social history for additional risk factors.   Cardiac Risk Factors include: advanced age (>65mn, >>62women);hypertension     Objective:    Today's Vitals   10/30/21 1547  Weight: 206 lb (93.4 kg)  Height: '5\' 4"'$  (1.626 m)   Body mass index is 35.36 kg/m.     10/30/2021    3:59 PM 08/17/2020   12:40 PM 06/09/2020    3:20 PM 06/02/2019   11:45 AM 03/09/2019   12:36 PM 05/28/2018   12:37 PM 05/26/2017   11:16 AM  Advanced Directives  Does Patient Have a Medical Advance Directive? No No No No No No No  Does patient want to make changes to medical advance directive?    No - Patient declined     Would patient like information on creating a medical advance directive? No - Patient declined No - Patient declined Yes (MAU/Ambulatory/Procedural Areas - Information given)  No - Patient declined Yes (MAU/Ambulatory/Procedural Areas - Information given) Yes (MAU/Ambulatory/Procedural Areas - Information given)    Current Medications (verified) Outpatient Encounter Medications as of 10/30/2021  Medication Sig   acetaminophen (TYLENOL) 650 MG CR tablet Take 650 mg by mouth every 8 (eight) hours as needed for pain.   amitriptyline (ELAVIL) 50 MG tablet TAKE 1 TABLET BY MOUTH EVERYDAY AT BEDTIME   atorvastatin (LIPITOR) 10 MG tablet TAKE 1 TABLET BY MOUTH EVERY DAY   B Complex-C-Folic Acid (EQL SUPER B COMPLEX/VITAMIN C PO) Take 1 tablet by mouth daily.   buPROPion (WELLBUTRIN SR) 150 MG 12 hr tablet TAKE 1 TABLET BY MOUTH TWICE A DAY   clonazePAM (KLONOPIN) 0.5 MG tablet TAKE 1 TABLET (0.5 MG TOTAL) BY MOUTH AT BEDTIME. AS NEEDED FOR ANXIETY/INSOMNIA   fluticasone (FLONASE) 50 MCG/ACT nasal spray SPRAY 2 SPRAYS INTO EACH NOSTRIL EVERY DAY   gabapentin  (NEURONTIN) 100 MG capsule TAKE 1 CAPSULE BY MOUTH THREE TIMES A DAY   losartan-hydrochlorothiazide (HYZAAR) 50-12.5 MG tablet TAKE 1 TABLET BY MOUTH DAILY   Multiple Vitamins-Minerals (WOMENS ONE DAILY PO) Take 1 tablet by mouth daily.   omeprazole (PRILOSEC) 20 MG capsule TAKE 1 CAPSULE BY MOUTH TWICE A DAY BEFORE MEALS   oxybutynin (DITROPAN-XL) 5 MG 24 hr tablet TAKE 1 TABLET BY MOUTH EVERYDAY AT BEDTIME   sertraline (ZOLOFT) 100 MG tablet TAKE 1.5 TABLETS (150 MG TOTAL) BY MOUTH DAILY   traMADol (ULTRAM) 50 MG tablet Take 2 tablets (100 mg total) by mouth every 8 (eight) hours as needed. for pain   traZODone (DESYREL) 100 MG tablet TAKE 1 TABLET BY MOUTH TWICE A DAY   No facility-administered encounter medications on file as of 10/30/2021.    Allergies (verified) Morphine and related   History: Past Medical History:  Diagnosis Date   Anxiety    Arthritis    Diabetes mellitus without complication (HCC)    diet controlled since gastric bypass   Hypertension    Sleep apnea    Past Surgical History:  Procedure Laterality Date   BLADDER SUSPENSION     CHOLECYSTECTOMY     COLONOSCOPY WITH PROPOFOL N/A 07/16/2016   Procedure: COLONOSCOPY WITH PROPOFOL;  Surgeon: AJonathon Bellows MD;  [facility]  Service: Endoscopy;  Laterality: N/A;   JOINT REPLACEMENT  left hip, right knee   ROUX-EN-Y PROCEDURE  2010   tracheotomy     at age 34 due to respiratroy failure   Family History  Problem Relation Age of Onset   COPD Mother    Hypertension Mother    Ovarian cancer Mother 50       ovarian CA   Breast cancer Daughter    Breast cancer Paternal Aunt    Breast cancer Paternal Grandmother    Cancer Sister    Social History   Socioeconomic History   Marital status: Widowed    Spouse name: Not on file   Number of children: Not on file   Years of education: Not on file   Highest education level: Not on file  Occupational History   Not on file  Tobacco Use   Smoking  status: Never   Smokeless tobacco: Never  Vaping Use   Vaping Use: Never used  Substance and Sexual Activity   Alcohol use: No   Drug use: No   Sexual activity: Never  Other Topics Concern   Not on file  Social History Narrative   Not on file   Social Determinants of Health   Financial Resource Strain: Low Risk  (10/30/2021)   Overall Financial Resource Strain (CARDIA)    Difficulty of Paying Living Expenses: Not hard at all  Food Insecurity: No Food Insecurity (10/30/2021)   Hunger Vital Sign    Worried About Running Out of Food in the Last Year: Never true    [place] in the Last Year: Never true  Transportation Needs: No Transportation Needs (10/30/2021)   PRAPARE - Hydrologist (Medical): No    Lack of Transportation (Non-Medical): No  Physical Activity: Not on file  Stress: No Stress Concern Present (10/30/2021)   [place]    Feeling of Stress : Not at all  Social Connections: Unknown (10/30/2021)   Social Connection and Isolation Panel [NHANES]    Frequency of Communication with Friends and Family: More than three times a week    Frequency of Social Gatherings with Friends and Family: More than three times a week    Attends Religious Services: Not on Advertising copywriter or Organizations: Not on file    Attends Archivist Meetings: Not on file    Marital Status: Not on file    Tobacco Counseling Counseling given: Not Answered   Clinical Intake:  Pre-visit preparation completed: Yes        Diabetes:  (Followed by PCP)  How often do you need to have someone help you when you read instructions, pamphlets, or other written materials from your doctor or pharmacy?: 2 - Rarely    Interpreter Needed?: No      Activities of Daily Living    10/30/2021    4:56 PM  In your present state of health, do you have any difficulty performing  the following activities:  Hearing? 0  Vision? 0  Difficulty concentrating or making decisions? 0  Walking or climbing stairs? 1  Comment Cane in use when walking  Dressing or bathing? 0  Doing errands, shopping? 1  Comment Family assist when driving  Preparing Food and eating ? N  Using the Toilet? N  In the past six months, have you accidently leaked urine? N  Do you have problems with loss of bowel control? N  Managing your Medications? N  Managing your Finances? N  Housekeeping or managing your Housekeeping? N    Patient Care Team: [name], MD as PCP - General (Internal Medicine)  Indicate any recent Medical Services you may have received from other than [facility] providers in the past year (date may be approximate).     Assessment:   This is a routine wellness examination for Jodi [name].  I connected with  Hoyle Sauer on 10/30/21 by a audio enabled telemedicine application and verified that I am speaking with the correct person using two identifiers.  Patient Location: Home  Provider Location: Office/Clinic  I discussed the limitations of evaluation and management by telemedicine. The patient expressed understanding and agreed to proceed.   Hearing/Vision screen Hearing Screening - Comments:: Patient is able to hear conversational tones without difficulty.  No issues reported.   Vision Screening - Comments:: Followed by Suzie Port[facility] [address]). Wears corrective lenses. They have regular follow up with the ophthalmologist  Dietary issues and exercise activities discussed: Current Exercise Habits: Home exercise routine, Intensity: Mild Regular diet  Good water intake   Goals Addressed             This Visit's Progress    Weight (lb) < 160 lb (72.6 kg)   206 lb (93.4 kg)    Reduce sugar  Portion control       Depression Screen    10/30/2021    4:53 PM 09/25/2021   12:20 PM 09/14/2020   11:14 AM 08/17/2020   12:38 PM 06/09/2020    3:27 PM 05/24/2020    10:27 AM 02/15/2020    2:36 PM  PHQ 2/9 Scores  PHQ - 2 Score '2 4 2 '$ 0 1 3 0  PHQ- 9 Score '8 11 7   12     '$ Fall Risk    10/30/2021    4:00 PM 09/25/2021   11:43 AM 03/15/2021   10:18 AM 09/14/2020   10:40 AM 08/17/2020    3:30 PM  [place] in the past year?  '1 1 1   '$ Comment No fall since last reported than 1 month ago      Number falls in past yr:  1 1 0   Injury with Fall?  0 1 0   Risk for fall due to : Impaired balance/gait;History of fall(s) History of fall(s) History of fall(s) History of fall(s)   Risk for fall due to: Comment Cane in use when ambulating      Follow up  Falls evaluation completed Falls evaluation completed Falls evaluation completed Falls evaluation completed    [place]: Home free of loose throw rugs in walkways, pet beds, electrical cords, etc? Yes  Adequate lighting in your home to reduce risk of falls? Yes   ASSISTIVE DEVICES UTILIZED TO PREVENT FALLS: Life alert? No  Use of a cane, walker or w/c? Yes , cane in use  Grab bars in the bathroom? Yes  Shower chair or bench in shower? Yes  Elevated toilet seat or a handicapped toilet? No   TIMED UP AND GO: Was the test performed? No .    Cognitive Function:    05/26/2017   11:42 AM 05/23/2016   12:03 PM  MMSE - Mini Mental State Exam  Orientation to time 5 5  Orientation to Place 5 5  Registration 3 3  Attention/ Calculation 5 2  Attention/Calculation-comments  Difficulty performing simple calculations  Recall 2 3  Language- name  2 objects 2 2  Language- repeat 1 1  Language- follow 3 step command 3 3  Language- read & follow direction 1 1  Write a sentence 1 1  Copy design 1 1  Total score 29 27        10/30/2021    4:01 PM 06/02/2019   11:51 AM 05/28/2018   12:41 PM  6CIT Screen  What Year? 0 points 0 points 0 points  What month? 0 points 0 points 0 points  What time? 0 points 0 points 0 points  Count back from 20 0 points  0 points  Months  in reverse 0 points 0 points 0 points  Repeat phrase 2 points  0 points  Total Score 2 points  0 points    Immunizations Immunization History  Administered Date(s) Administered   Fluad Quad(high Dose 65+) 11/11/2018, 10/01/2019, 10/05/2021   Influenza, High Dose Seasonal PF 12/08/2012, 02/10/2015, 02/07/2016, 11/11/2016, 11/19/2017   Pneumococcal Conjugate-13 12/08/2012   Pneumococcal Polysaccharide-23 10/22/2008, 02/10/2015   Zoster, Live 12/08/2009   TDAP status: Due, Education has been provided regarding the importance of this vaccine. Advised may receive this vaccine at local pharmacy or Health Dept. Aware to provide a copy of the vaccination record if obtained from local pharmacy or Health Dept. Verbalized acceptance and understanding.  Covid-19 vaccine status: Declined, Education has been provided regarding the importance of this vaccine but patient still declined. Advised may receive this vaccine at local pharmacy or Health Dept.or vaccine clinic. Aware to provide a copy of the vaccination record if obtained from local pharmacy or Health Dept. Verbalized acceptance and understanding.  Shingrix Completed?: No.    Education has been provided regarding the importance of this vaccine. Patient has been advised to call insurance company to determine out of pocket expense if they have not yet received this vaccine. Advised may also receive vaccine at local pharmacy or Health Dept. Verbalized acceptance and understanding.  Screening Tests Health Maintenance  Topic Date Due   MAMMOGRAM  05/29/2017   COLONOSCOPY (Pts 45-57yr Insurance coverage will need to be confirmed)  07/17/2019   COVID-19 Vaccine (1) 11/15/2021 (Originally 10/27/1950)   TETANUS/TDAP  12/07/2021 (Originally 10/26/1964)   Zoster Vaccines- Shingrix (1 of 2) 01/30/2022 (Originally 10/26/1964)   OPHTHALMOLOGY EXAM  03/16/2022 (Originally 12/16/2013)   Diabetic kidney evaluation - Urine ACR  03/16/2022   HEMOGLOBIN A1C   03/26/2022   FOOT EXAM  09/26/2022   Diabetic kidney evaluation - GFR measurement  10/06/2022   Medicare Annual Wellness (AWV)  11/30/2022   Pneumonia Vaccine 75 Years old  Completed   INFLUENZA VACCINE  Completed   DEXA SCAN  Completed   Hepatitis C Screening  Completed   HPV VACCINES  Aged Out   Health Maintenance Health Maintenance Due  Topic Date Due   MAMMOGRAM  05/29/2017   COLONOSCOPY (Pts 45-470yrInsurance coverage will need to be confirmed)  07/17/2019   Lung Cancer Screening: (Low Dose CT Chest recommended if Age 75-80ears, 30 pack-year currently smoking OR have quit w/in 15years.) does not qualify.   Mammogram- previously scheduled. [facility].   Colonoscopy- deferred per patient for further follow up with PCP.   Hepatitis C Screening: Completed 2017.  Vision Screening: Recommended annual ophthalmology exams for early detection of glaucoma and other disorders of the eye.  Dental Screening: Recommended annual dental exams for proper oral hygiene.  Community Resource Referral / Chronic Care Management: CRR required this  visit?  No   CCM required this visit?  No      Plan:     I have personally reviewed and noted the following in the patient's chart:   Medical and social history Use of alcohol, tobacco or illicit drugs  Current medications and supplements including opioid prescriptions. Patient is currently taking opioid prescriptions. Information provided to patient regarding non-opioid alternatives. Patient advised to discuss non-opioid treatment plan with their provider. Taking Tramadol, followed by PCP. Functional ability and status Nutritional status Physical activity Advanced directives List of other physicians Hospitalizations, surgeries, and ER visits in previous 12 months Vitals Screenings to include cognitive, depression, and falls Referrals and appointments  In addition, I have  reviewed and discussed with patient certain preventive protocols, quality metrics, and best practice recommendations. A written personalized care plan for preventive services as well as general preventive health recommendations were provided to patient.     [place], LPN   [id number]     I have reviewed the above information and agree with above.   Deborra Medina, MD

## 2021-10-30 NOTE — Patient Instructions (Addendum)
Jodi [name] , Thank you for taking time to come for your Medicare Wellness Visit. I appreciate your ongoing commitment to your health goals. Please review the following plan we discussed and let me know if I can assist you in the future.   These are the goals we discussed:  Goals      Weight (lb) < 160 lb (72.6 kg)     Reduce sugar  Portion control        This is a list of the screening recommended for you and due dates:  Health Maintenance  Topic Date Due   Mammogram  05/29/2017   Colon Cancer Screening  07/17/2019   COVID-19 Vaccine (1) 11/15/2021*   Tetanus Vaccine  12/07/2021*   Zoster (Shingles) Vaccine (1 of 2) 01/30/2022*   Eye exam for diabetics  03/16/2022*   Yearly kidney health urinalysis for diabetes  03/16/2022   Hemoglobin A1C  03/26/2022   Complete foot exam   09/26/2022   Yearly kidney function blood test for diabetes  10/06/2022   Medicare Annual Wellness Visit  11/30/2022   Pneumonia Vaccine  Completed   Flu Shot  Completed   DEXA scan (bone density measurement)  Completed   Hepatitis C Screening: USPSTF Recommendation to screen - Ages 9-79 yo.  Completed   HPV Vaccine  Aged Out  *Topic was postponed. The date shown is not the original due date.    Advanced directives: End of life planning; Advanced aging; Advanced directives discussed.  No HCPOA/Living Will.  Additional information declined at this time.  Conditions/risks identified: none new  Next appointment: Follow up in one year for your annual wellness visit    Preventive Care 65 Years and Older, Female Preventive care refers to lifestyle choices and visits with your health care provider that can promote health and wellness. What does preventive care include? A yearly physical exam. This is also called an annual well check. Dental exams once or twice a year. Routine eye exams. Ask your health care provider how often you should have your eyes checked. Personal lifestyle choices,  including: Daily care of your teeth and gums. Regular physical activity. Eating a healthy diet. Avoiding tobacco and drug use. Limiting alcohol use. Practicing safe sex. Taking low-dose aspirin every day. Taking vitamin and mineral supplements as recommended by your health care provider. What happens during an annual well check? The services and screenings done by your health care provider during your annual well check will depend on your age, overall health, lifestyle risk factors, and family history of disease. Counseling  Your health care provider may ask you questions about your: Alcohol use. Tobacco use. Drug use. Emotional well-being. Home and relationship well-being. Sexual activity. Eating habits. History of falls. Memory and ability to understand (cognition). Work and work Statistician. Reproductive health. Screening  You may have the following tests or measurements: Height, weight, and BMI. Blood pressure. Lipid and cholesterol levels. These may be checked every 5 years, or more frequently if you are over 33 years old. Skin check. Lung cancer screening. You may have this screening every year starting at age 54 if you have a 30-pack-year history of smoking and currently smoke or have quit within the past 15 years. Fecal occult blood test (FOBT) of the stool. You may have this test every year starting at age 17. Flexible sigmoidoscopy or colonoscopy. You may have a sigmoidoscopy every 5 years or a colonoscopy every 10 years starting at age 53. Hepatitis C blood test. Hepatitis B blood  test. Sexually transmitted disease (STD) testing. Diabetes screening. This is done by checking your blood sugar (glucose) after you have not eaten for a while (fasting). You may have this done every 1-3 years. Bone density scan. This is done to screen for osteoporosis. You may have this done starting at age 64. Mammogram. This may be done every 1-2 years. Talk to your health care provider  about how often you should have regular mammograms. Talk with your health care provider about your test results, treatment options, and if necessary, the need for more tests. Vaccines  Your health care provider may recommend certain vaccines, such as: Influenza vaccine. This is recommended every year. Tetanus, diphtheria, and acellular pertussis (Tdap, Td) vaccine. You may need a Td booster every 10 years. Zoster vaccine. You may need this after age 33. Pneumococcal 13-valent conjugate (PCV13) vaccine. One dose is recommended after age 56. Pneumococcal polysaccharide (PPSV23) vaccine. One dose is recommended after age 31. Talk to your health care provider about which screenings and vaccines you need and how often you need them. This information is not intended to replace advice given to you by your health care provider. Make sure you discuss any questions you have with your health care provider. Document Released: 01/20/2015 Document Revised: 09/13/2015 Document Reviewed: 10/25/2014 Elsevier Interactive Patient Education  2017 [place] Prevention in the Home Falls can cause injuries. They can happen to people of all ages. There are many things you can do to make your home safe and to help prevent falls. What can I do on the outside of my home? Regularly fix the edges of walkways and driveways and fix any cracks. Remove anything that might make you trip as you walk through a door, such as a raised step or threshold. Trim any bushes or trees on the path to your home. Use bright outdoor lighting. Clear any walking paths of anything that might make someone trip, such as rocks or tools. Regularly check to see if handrails are loose or broken. Make sure that both sides of any steps have handrails. Any raised decks and porches should have guardrails on the edges. Have any leaves, snow, or ice cleared regularly. Use sand or salt on walking paths during winter. Clean up any spills in  your garage right away. This includes oil or grease spills. What can I do in the bathroom? Use night lights. Install grab bars by the toilet and in the tub and shower. Do not use towel bars as grab bars. Use non-skid mats or decals in the tub or shower. If you need to sit down in the shower, use a plastic, non-slip stool. Keep the floor dry. Clean up any water that spills on the floor as soon as it happens. Remove soap buildup in the tub or shower regularly. Attach bath mats securely with double-sided non-slip rug tape. Do not have throw rugs and other things on the floor that can make you trip. What can I do in the bedroom? Use night lights. Make sure that you have a light by your bed that is easy to reach. Do not use any sheets or blankets that are too big for your bed. They should not hang down onto the floor. Have a firm chair that has side arms. You can use this for support while you get dressed. Do not have throw rugs and other things on the floor that can make you trip. What can I do in the kitchen? Clean up any spills right  away. Avoid walking on wet floors. Keep items that you use a lot in easy-to-reach places. If you need to reach something above you, use a strong step stool that has a grab bar. Keep electrical cords out of the way. Do not use floor polish or wax that makes floors slippery. If you must use wax, use non-skid floor wax. Do not have throw rugs and other things on the floor that can make you trip. What can I do with my stairs? Do not leave any items on the stairs. Make sure that there are handrails on both sides of the stairs and use them. Fix handrails that are broken or loose. Make sure that handrails are as long as the stairways. Check any carpeting to make sure that it is firmly attached to the stairs. Fix any carpet that is loose or worn. Avoid having throw rugs at the top or bottom of the stairs. If you do have throw rugs, attach them to the floor with carpet  tape. Make sure that you have a light switch at the top of the stairs and the bottom of the stairs. If you do not have them, ask someone to add them for you. What else can I do to help prevent falls? Wear shoes that: Do not have high heels. Have rubber bottoms. Are comfortable and fit you well. Are closed at the toe. Do not wear sandals. If you use a stepladder: Make sure that it is fully opened. Do not climb a closed stepladder. Make sure that both sides of the stepladder are locked into place. Ask someone to hold it for you, if possible. Clearly mark and make sure that you can see: Any grab bars or handrails. First and last steps. Where the edge of each step is. Use tools that help you move around (mobility aids) if they are needed. These include: Canes. Walkers. Scooters. Crutches. Turn on the lights when you go into a dark area. Replace any light bulbs as soon as they burn out. Set up your furniture so you have a clear path. Avoid moving your furniture around. If any of your floors are uneven, fix them. If there are any pets around you, be aware of where they are. Review your medicines with your doctor. Some medicines can make you feel dizzy. This can increase your chance of falling. Ask your doctor what other things that you can do to help prevent falls. This information is not intended to replace advice given to you by your health care provider. Make sure you discuss any questions you have with your health care provider. Document Released: 10/20/2008 Document Revised: 06/01/2015 Document Reviewed: 01/28/2014 Elsevier Interactive Patient Education  2017 [place].  Opioid Pain Medicine Management Opioids are powerful medicines that are used to treat moderate to severe pain. When used for short periods of time, they can help you to: Sleep better. Do better in physical or occupational therapy. Feel better in the first few days after an injury. Recover from surgery. Opioids  should be taken with the supervision of a trained health care provider. They should be taken for the shortest period of time possible. This is because opioids can be addictive, and the longer you take opioids, the greater your risk of addiction. This addiction can also be called opioid use disorder. What are the risks? Using opioid pain medicines for longer than 3 days increases your risk of side effects. Side effects include: Constipation. Nausea and vomiting. Breathing difficulties (respiratory depression). Drowsiness. Confusion. Opioid  use disorder. Itching. Taking opioid pain medicine for a long period of time can affect your ability to do daily tasks. It also puts you at risk for: Motor vehicle crashes. Depression. Suicide. Heart attack. Overdose, which can be life-threatening. What is a pain treatment plan? A pain treatment plan is an agreement between you and your health care provider. Pain is unique to each person, and treatments vary depending on your condition. To manage your pain, you and your health care provider need to work together. To help you do this: Discuss the goals of your treatment, including how much pain you might expect to have and how you will manage the pain. Review the risks and benefits of taking opioid medicines. Remember that a good treatment plan uses more than one approach and minimizes the chance of side effects. Be honest about the amount of medicines you take and about any drug or alcohol use. Get pain medicine prescriptions from only one health care provider. Pain can be managed with many types of alternative treatments. Ask your health care provider to refer you to one or more specialists who can help you manage pain through: Physical or occupational therapy. Counseling (cognitive behavioral therapy). Good nutrition. Biofeedback. Massage. Meditation. Non-opioid medicine. Following a gentle exercise program. How to use opioid pain medicine Taking  medicine Take your pain medicine exactly as told by your health care provider. Take it only when you need it. If your pain gets less severe, you may take less than your prescribed dose if your health care provider approves. If you are not having pain, do nottake pain medicine unless your health care provider tells you to take it. If your pain is severe, do nottry to treat it yourself by taking more pills than instructed on your prescription. Contact your health care provider for help. Write down the times when you take your pain medicine. It is easy to become confused while on pain medicine. Writing the time can help you avoid overdose. Take other over-the-counter or prescription medicines only as told by your health care provider. Keeping yourself and others safe  While you are taking opioid pain medicine: Do not drive, use machinery, or power tools. Do not sign legal documents. Do not drink alcohol. Do not take sleeping pills. Do not supervise children by yourself. Do not do activities that require climbing or being in high places. Do not go to a lake, river, ocean, spa, or swimming pool. Do not share your pain medicine with anyone. Keep pain medicine in a locked cabinet or in a secure area where pets and children cannot reach it. Stopping your use of opioids If you have been taking opioid medicine for more than a few weeks, you may need to slowly decrease (taper) how much you take until you stop completely. Tapering your use of opioids can decrease your risk of symptoms of withdrawal, such as: Pain and cramping in the abdomen. Nausea. Sweating. Sleepiness. Restlessness. Uncontrollable shaking (tremors). Cravings for the medicine. Do not attempt to taper your use of opioids on your own. Talk with your health care provider about how to do this. Your health care provider may prescribe a step-down schedule based on how much medicine you are taking and how long you have been taking  it. Getting rid of leftover pills Do not save any leftover pills. Get rid of leftover pills safely by: Taking the medicine to a prescription take-back program. This is usually offered by the county or law enforcement. Bringing them to a  pharmacy that has a drug disposal container. Flushing them down the toilet. Check the label or package insert of your medicine to see whether this is safe to do. Throwing them out in the trash. Check the label or package insert of your medicine to see whether this is safe to do. If it is safe to throw it out, remove the medicine from the original container, put it into a sealable bag or container, and mix it with used coffee grounds, food scraps, dirt, or cat litter before putting it in the trash. Follow these instructions at home: Activity Do exercises as told by your health care provider. Avoid activities that make your pain worse. Return to your normal activities as told by your health care provider. Ask your health care provider what activities are safe for you. General instructions You may need to take these actions to prevent or treat constipation: Drink enough fluid to keep your urine pale yellow. Take over-the-counter or prescription medicines. Eat foods that are high in fiber, such as beans, whole grains, and fresh fruits and vegetables. Limit foods that are high in fat and processed sugars, such as fried or sweet foods. Keep all follow-up visits. This is important. Where to find support If you have been taking opioids for a long time, you may benefit from receiving support for quitting from a local support group or counselor. Ask your health care provider for a referral to these resources in your area. Where to find more information Centers for Disease Control and Prevention (CDC): http://www.wolf.info/ [place] Food and Drug Administration (FDA): [place] Get help right away if: You may have taken too much of an opioid (overdosed). Common symptoms of an  overdose: Your breathing is slower or more shallow than normal. You have a very slow heartbeat (pulse). You have slurred speech. You have nausea and vomiting. Your pupils become very small. You have other potential symptoms: You are very confused. You faint or feel like you will faint. You have cold, clammy skin. You have blue lips or fingernails. You have thoughts of harming yourself or harming others. These symptoms may represent a serious problem that is an emergency. Do not wait to see if the symptoms will go away. Get medical help right away. Call your local emergency services (911 in the [place] Do not drive yourself to the hospital.  If you ever feel like you may hurt yourself or others, or have thoughts about taking your own life, get help right away. Go to your nearest emergency department or: Call your local emergency services (911 in the [place] Call the [facility] (445)684-7887 in the [place] Call a suicide crisis helpline, such as the [place] at 619-328-0551 or 988 in the [place]. This is open 24 hours a day in the [place] Text the Crisis Text Line at 559-449-8048 (in the [place].). Summary Opioid medicines can help you manage moderate to severe pain for a short period of time. A pain treatment plan is an agreement between you and your health care provider. Discuss the goals of your treatment, including how much pain you might expect to have and how you will manage the pain. If you think that you or someone else may have taken too much of an opioid, get medical help right away. This information is not intended to replace advice given to you by your health care provider. Make sure you discuss any questions you have with your health care provider. Document Revised: 07/19/2020 Document Reviewed: 04/05/2020 Elsevier  Patient Education  2023 Elsevier Inc.  

## 2021-11-05 DIAGNOSIS — Z9181 History of falling: Secondary | ICD-10-CM | POA: Diagnosis not present

## 2021-11-05 DIAGNOSIS — M545 Low back pain, unspecified: Secondary | ICD-10-CM | POA: Diagnosis not present

## 2021-11-05 DIAGNOSIS — Z6830 Body mass index (BMI) 30.0-30.9, adult: Secondary | ICD-10-CM | POA: Diagnosis not present

## 2021-11-05 DIAGNOSIS — E1140 Type 2 diabetes mellitus with diabetic neuropathy, unspecified: Secondary | ICD-10-CM | POA: Diagnosis not present

## 2021-11-05 DIAGNOSIS — N1831 Chronic kidney disease, stage 3a: Secondary | ICD-10-CM | POA: Diagnosis not present

## 2021-11-05 DIAGNOSIS — E1122 Type 2 diabetes mellitus with diabetic chronic kidney disease: Secondary | ICD-10-CM | POA: Diagnosis not present

## 2021-11-05 DIAGNOSIS — F329 Major depressive disorder, single episode, unspecified: Secondary | ICD-10-CM | POA: Diagnosis not present

## 2021-11-05 DIAGNOSIS — D126 Benign neoplasm of colon, unspecified: Secondary | ICD-10-CM | POA: Diagnosis not present

## 2021-11-05 DIAGNOSIS — Z96642 Presence of left artificial hip joint: Secondary | ICD-10-CM | POA: Diagnosis not present

## 2021-11-05 DIAGNOSIS — G8929 Other chronic pain: Secondary | ICD-10-CM | POA: Diagnosis not present

## 2021-11-05 DIAGNOSIS — I152 Hypertension secondary to endocrine disorders: Secondary | ICD-10-CM | POA: Diagnosis not present

## 2021-11-05 DIAGNOSIS — R69 Illness, unspecified: Secondary | ICD-10-CM | POA: Diagnosis not present

## 2021-11-05 DIAGNOSIS — E1159 Type 2 diabetes mellitus with other circulatory complications: Secondary | ICD-10-CM | POA: Diagnosis not present

## 2021-11-05 DIAGNOSIS — H9193 Unspecified hearing loss, bilateral: Secondary | ICD-10-CM | POA: Diagnosis not present

## 2021-11-05 DIAGNOSIS — Z79899 Other long term (current) drug therapy: Secondary | ICD-10-CM | POA: Diagnosis not present

## 2021-11-05 DIAGNOSIS — E669 Obesity, unspecified: Secondary | ICD-10-CM | POA: Diagnosis not present

## 2021-11-05 DIAGNOSIS — Z96651 Presence of right artificial knee joint: Secondary | ICD-10-CM | POA: Diagnosis not present

## 2021-11-12 DIAGNOSIS — E1159 Type 2 diabetes mellitus with other circulatory complications: Secondary | ICD-10-CM | POA: Diagnosis not present

## 2021-11-12 DIAGNOSIS — I152 Hypertension secondary to endocrine disorders: Secondary | ICD-10-CM | POA: Diagnosis not present

## 2021-11-12 DIAGNOSIS — N1831 Chronic kidney disease, stage 3a: Secondary | ICD-10-CM | POA: Diagnosis not present

## 2021-11-12 DIAGNOSIS — Z6830 Body mass index (BMI) 30.0-30.9, adult: Secondary | ICD-10-CM | POA: Diagnosis not present

## 2021-11-12 DIAGNOSIS — H9193 Unspecified hearing loss, bilateral: Secondary | ICD-10-CM | POA: Diagnosis not present

## 2021-11-12 DIAGNOSIS — Z96642 Presence of left artificial hip joint: Secondary | ICD-10-CM | POA: Diagnosis not present

## 2021-11-12 DIAGNOSIS — E669 Obesity, unspecified: Secondary | ICD-10-CM | POA: Diagnosis not present

## 2021-11-12 DIAGNOSIS — M545 Low back pain, unspecified: Secondary | ICD-10-CM | POA: Diagnosis not present

## 2021-11-12 DIAGNOSIS — E1140 Type 2 diabetes mellitus with diabetic neuropathy, unspecified: Secondary | ICD-10-CM | POA: Diagnosis not present

## 2021-11-12 DIAGNOSIS — D126 Benign neoplasm of colon, unspecified: Secondary | ICD-10-CM | POA: Diagnosis not present

## 2021-11-12 DIAGNOSIS — E1122 Type 2 diabetes mellitus with diabetic chronic kidney disease: Secondary | ICD-10-CM | POA: Diagnosis not present

## 2021-11-12 DIAGNOSIS — F329 Major depressive disorder, single episode, unspecified: Secondary | ICD-10-CM | POA: Diagnosis not present

## 2021-11-12 DIAGNOSIS — Z96651 Presence of right artificial knee joint: Secondary | ICD-10-CM | POA: Diagnosis not present

## 2021-11-12 DIAGNOSIS — Z79899 Other long term (current) drug therapy: Secondary | ICD-10-CM | POA: Diagnosis not present

## 2021-11-12 DIAGNOSIS — R69 Illness, unspecified: Secondary | ICD-10-CM | POA: Diagnosis not present

## 2021-11-12 DIAGNOSIS — Z9181 History of falling: Secondary | ICD-10-CM | POA: Diagnosis not present

## 2021-11-12 DIAGNOSIS — G8929 Other chronic pain: Secondary | ICD-10-CM | POA: Diagnosis not present

## 2021-11-21 DIAGNOSIS — Z96642 Presence of left artificial hip joint: Secondary | ICD-10-CM | POA: Diagnosis not present

## 2021-11-21 DIAGNOSIS — Z6830 Body mass index (BMI) 30.0-30.9, adult: Secondary | ICD-10-CM | POA: Diagnosis not present

## 2021-11-21 DIAGNOSIS — E1159 Type 2 diabetes mellitus with other circulatory complications: Secondary | ICD-10-CM | POA: Diagnosis not present

## 2021-11-21 DIAGNOSIS — I152 Hypertension secondary to endocrine disorders: Secondary | ICD-10-CM | POA: Diagnosis not present

## 2021-11-21 DIAGNOSIS — E1140 Type 2 diabetes mellitus with diabetic neuropathy, unspecified: Secondary | ICD-10-CM | POA: Diagnosis not present

## 2021-11-21 DIAGNOSIS — E1122 Type 2 diabetes mellitus with diabetic chronic kidney disease: Secondary | ICD-10-CM | POA: Diagnosis not present

## 2021-11-21 DIAGNOSIS — F329 Major depressive disorder, single episode, unspecified: Secondary | ICD-10-CM | POA: Diagnosis not present

## 2021-11-21 DIAGNOSIS — D126 Benign neoplasm of colon, unspecified: Secondary | ICD-10-CM | POA: Diagnosis not present

## 2021-11-21 DIAGNOSIS — H9193 Unspecified hearing loss, bilateral: Secondary | ICD-10-CM | POA: Diagnosis not present

## 2021-11-21 DIAGNOSIS — G8929 Other chronic pain: Secondary | ICD-10-CM | POA: Diagnosis not present

## 2021-11-21 DIAGNOSIS — R69 Illness, unspecified: Secondary | ICD-10-CM | POA: Diagnosis not present

## 2021-11-21 DIAGNOSIS — E669 Obesity, unspecified: Secondary | ICD-10-CM | POA: Diagnosis not present

## 2021-11-21 DIAGNOSIS — M545 Low back pain, unspecified: Secondary | ICD-10-CM | POA: Diagnosis not present

## 2021-11-21 DIAGNOSIS — Z79899 Other long term (current) drug therapy: Secondary | ICD-10-CM | POA: Diagnosis not present

## 2021-11-21 DIAGNOSIS — N1831 Chronic kidney disease, stage 3a: Secondary | ICD-10-CM | POA: Diagnosis not present

## 2021-11-21 DIAGNOSIS — Z9181 History of falling: Secondary | ICD-10-CM | POA: Diagnosis not present

## 2021-11-21 DIAGNOSIS — Z96651 Presence of right artificial knee joint: Secondary | ICD-10-CM | POA: Diagnosis not present

## 2021-11-22 ENCOUNTER — Other Ambulatory Visit: Payer: Self-pay | Admitting: Nephrology

## 2021-11-22 DIAGNOSIS — I10 Essential (primary) hypertension: Secondary | ICD-10-CM | POA: Diagnosis not present

## 2021-11-22 DIAGNOSIS — N1831 Chronic kidney disease, stage 3a: Secondary | ICD-10-CM | POA: Diagnosis not present

## 2021-11-22 DIAGNOSIS — R7303 Prediabetes: Secondary | ICD-10-CM | POA: Diagnosis not present

## 2021-11-28 ENCOUNTER — Other Ambulatory Visit: Payer: Self-pay | Admitting: Internal Medicine

## 2021-11-28 NOTE — Telephone Encounter (Signed)
Refilled: 08/13/2021 Last OV: 09/25/2021 Next OV: 03/27/2022

## 2021-12-06 ENCOUNTER — Other Ambulatory Visit: Payer: Self-pay | Admitting: Internal Medicine

## 2021-12-08 ENCOUNTER — Other Ambulatory Visit: Payer: Self-pay | Admitting: Internal Medicine

## 2021-12-20 DIAGNOSIS — N1831 Chronic kidney disease, stage 3a: Secondary | ICD-10-CM | POA: Diagnosis not present

## 2021-12-20 DIAGNOSIS — I10 Essential (primary) hypertension: Secondary | ICD-10-CM | POA: Diagnosis not present

## 2021-12-20 DIAGNOSIS — N2581 Secondary hyperparathyroidism of renal origin: Secondary | ICD-10-CM | POA: Diagnosis not present

## 2021-12-25 ENCOUNTER — Ambulatory Visit
Admission: RE | Admit: 2021-12-25 | Discharge: 2021-12-25 | Disposition: A | Discharge status: Home / Self Care | Payer: Medicare HMO | Source: Ambulatory Visit | Attending: Nephrology | Admitting: Nephrology

## 2021-12-25 DIAGNOSIS — N189 Chronic kidney disease, unspecified: Secondary | ICD-10-CM | POA: Diagnosis not present

## 2021-12-25 DIAGNOSIS — N1831 Chronic kidney disease, stage 3a: Secondary | ICD-10-CM | POA: Insufficient documentation

## 2022-01-03 ENCOUNTER — Other Ambulatory Visit: Payer: Self-pay | Admitting: Internal Medicine

## 2022-02-04 ENCOUNTER — Other Ambulatory Visit: Payer: Self-pay | Admitting: Internal Medicine

## 2022-03-02 ENCOUNTER — Other Ambulatory Visit: Payer: Self-pay | Admitting: Internal Medicine

## 2022-03-27 ENCOUNTER — Ambulatory Visit: Payer: Medicare HMO | Admitting: Internal Medicine

## 2022-03-31 ENCOUNTER — Other Ambulatory Visit: Payer: Self-pay | Admitting: Internal Medicine

## 2022-04-06 ENCOUNTER — Other Ambulatory Visit: Payer: Self-pay | Admitting: Internal Medicine

## 2022-04-09 NOTE — Telephone Encounter (Signed)
Refilled: 03/04/2022 Last OV: 09/25/2021 Next OV: not scheduled

## 2022-04-18 DIAGNOSIS — N2581 Secondary hyperparathyroidism of renal origin: Secondary | ICD-10-CM | POA: Diagnosis not present

## 2022-04-18 DIAGNOSIS — I10 Essential (primary) hypertension: Secondary | ICD-10-CM | POA: Diagnosis not present

## 2022-04-18 DIAGNOSIS — R809 Proteinuria, unspecified: Secondary | ICD-10-CM | POA: Diagnosis not present

## 2022-04-18 DIAGNOSIS — N1831 Chronic kidney disease, stage 3a: Secondary | ICD-10-CM | POA: Diagnosis not present

## 2022-05-21 ENCOUNTER — Other Ambulatory Visit: Payer: Self-pay | Admitting: Family

## 2022-06-17 ENCOUNTER — Other Ambulatory Visit: Payer: Self-pay | Admitting: Internal Medicine

## 2022-07-09 ENCOUNTER — Other Ambulatory Visit: Payer: Self-pay | Admitting: Internal Medicine

## 2022-07-10 NOTE — Telephone Encounter (Signed)
PA is needed

## 2022-07-15 ENCOUNTER — Telehealth: Payer: Self-pay

## 2022-07-15 ENCOUNTER — Other Ambulatory Visit (HOSPITAL_COMMUNITY): Payer: Self-pay

## 2022-07-15 NOTE — Telephone Encounter (Signed)
Patient Advocate Encounter  Prior Authorization for oxyBUTYnin Chloride ER 5MG  er tablets  has been approved with [employer].    PA# [id number] Effective dates: 01/07/22 through 01/07/23

## 2022-07-15 NOTE — Telephone Encounter (Signed)
Pharmacy Patient Advocate Encounter   Received notification that prior authorization for oxyBUTYnin Chloride ER 5MG  er tablets is required/requested.   PA submitted to [facility] via CoverMyMeds Key or (Medicaid) confirmation # [id number] Status is pending

## 2022-07-16 NOTE — Telephone Encounter (Signed)
noted 

## 2022-07-17 ENCOUNTER — Other Ambulatory Visit: Payer: Self-pay

## 2022-07-17 ENCOUNTER — Emergency Department
Admission: EM | Admit: 2022-07-17 | Discharge: 2022-07-18 | Disposition: A | Discharge status: Other Acute Inpatient Hospital | Payer: Medicare HMO | Attending: Emergency Medicine | Admitting: Emergency Medicine

## 2022-07-17 ENCOUNTER — Emergency Department: Payer: Medicare HMO

## 2022-07-17 ENCOUNTER — Encounter (HOSPITAL_COMMUNITY): Payer: Self-pay

## 2022-07-17 DIAGNOSIS — W19XXXA Unspecified fall, initial encounter: Secondary | ICD-10-CM | POA: Diagnosis not present

## 2022-07-17 DIAGNOSIS — S7291XA Unspecified fracture of right femur, initial encounter for closed fracture: Secondary | ICD-10-CM

## 2022-07-17 DIAGNOSIS — S72351A Displaced comminuted fracture of shaft of right femur, initial encounter for closed fracture: Secondary | ICD-10-CM | POA: Diagnosis not present

## 2022-07-17 DIAGNOSIS — W1839XA Other fall on same level, initial encounter: Secondary | ICD-10-CM | POA: Insufficient documentation

## 2022-07-17 DIAGNOSIS — S80919A Unspecified superficial injury of unspecified knee, initial encounter: Secondary | ICD-10-CM | POA: Diagnosis not present

## 2022-07-17 DIAGNOSIS — Z96651 Presence of right artificial knee joint: Secondary | ICD-10-CM | POA: Insufficient documentation

## 2022-07-17 DIAGNOSIS — S72401A Unspecified fracture of lower end of right femur, initial encounter for closed fracture: Secondary | ICD-10-CM | POA: Insufficient documentation

## 2022-07-17 DIAGNOSIS — R110 Nausea: Secondary | ICD-10-CM | POA: Diagnosis not present

## 2022-07-17 DIAGNOSIS — S8991XA Unspecified injury of right lower leg, initial encounter: Secondary | ICD-10-CM | POA: Diagnosis present

## 2022-07-17 DIAGNOSIS — Z01818 Encounter for other preprocedural examination: Secondary | ICD-10-CM | POA: Diagnosis not present

## 2022-07-17 DIAGNOSIS — I10 Essential (primary) hypertension: Secondary | ICD-10-CM | POA: Diagnosis not present

## 2022-07-17 DIAGNOSIS — S7290XA Unspecified fracture of unspecified femur, initial encounter for closed fracture: Secondary | ICD-10-CM | POA: Diagnosis not present

## 2022-07-17 LAB — CBC WITH DIFFERENTIAL/PLATELET
Abs Immature Granulocytes: 0.03 10*3/uL (ref 0.00–0.07)
Basophils Absolute: 0 10*3/uL (ref 0.0–0.1)
Basophils Relative: 0 %
Eosinophils Absolute: 0 10*3/uL (ref 0.0–0.5)
Eosinophils Relative: 0 %
HCT: 35.3 % — ABNORMAL LOW (ref 36.0–46.0)
Hemoglobin: 11.5 g/dL — ABNORMAL LOW (ref 12.0–15.0)
Immature Granulocytes: 0 %
Lymphocytes Relative: 10 %
Lymphs Abs: 1.1 10*3/uL (ref 0.7–4.0)
MCH: 30.1 pg (ref 26.0–34.0)
MCHC: 32.6 g/dL (ref 30.0–36.0)
MCV: 92.4 fL (ref 80.0–100.0)
Monocytes Absolute: 0.6 10*3/uL (ref 0.1–1.0)
Monocytes Relative: 6 %
Neutro Abs: 8.8 10*3/uL — ABNORMAL HIGH (ref 1.7–7.7)
Neutrophils Relative %: 84 %
Platelets: 228 10*3/uL (ref 150–400)
RBC: 3.82 MIL/uL — ABNORMAL LOW (ref 3.87–5.11)
RDW: 13 % (ref 11.5–15.5)
WBC: 10.6 10*3/uL — ABNORMAL HIGH (ref 4.0–10.5)
nRBC: 0 % (ref 0.0–0.2)

## 2022-07-17 LAB — BASIC METABOLIC PANEL
Anion gap: 9 (ref 5–15)
BUN: 23 mg/dL (ref 8–23)
CO2: 24 mmol/L (ref 22–32)
Calcium: 8.5 mg/dL — ABNORMAL LOW (ref 8.9–10.3)
Chloride: 107 mmol/L (ref 98–111)
Creatinine, Ser: 1.16 mg/dL — ABNORMAL HIGH (ref 0.44–1.00)
GFR, Estimated: 49 mL/min — ABNORMAL LOW (ref >60)
Glucose, Bld: 132 mg/dL — ABNORMAL HIGH (ref 70–99)
Potassium: 3.4 mmol/L — ABNORMAL LOW (ref 3.5–5.1)
Sodium: 140 mmol/L (ref 135–145)

## 2022-07-17 MED ORDER — HYDROMORPHONE HCL 1 MG/ML IJ SOLN
1.0000 mg | Freq: Once | INTRAMUSCULAR | Status: AC
  Administered 2022-07-17: 1 mg via INTRAVENOUS
  Filled 2022-07-17: qty 1

## 2022-07-17 MED ORDER — FENTANYL CITRATE PF 50 MCG/ML IJ SOSY
100.0000 ug | PREFILLED_SYRINGE | Freq: Once | INTRAMUSCULAR | Status: AC
  Administered 2022-07-17: 100 ug via INTRAVENOUS
  Filled 2022-07-17: qty 2

## 2022-07-17 NOTE — ED Provider Notes (Signed)
[facility] Provider Note    Event Date/Time   First MD Initiated Contact with Patient 07/17/22 1918     (approximate)   History   Right knee pain   HPI  Jodi [name] is a 77 y.o. female  who presents to the emergency department today because of concern for right knee pain. Patient was trying to put a leash on a dog when she fell over. Landed on her right knee. Felt a pop and had immediate onset of pain. Pain primarily in the right knee but it does radiate up to her right hip. Has history of right knee replacement.        Physical Exam   Triage Vital Signs: ED Triage Vitals  Enc Vitals Group     BP 07/17/22 1928 (!) 176/75     Pulse Rate 07/17/22 1928 89     Resp 07/17/22 1928 20     Temp 07/17/22 1928 97.8 F (36.6 C)     Temp Source 07/17/22 1928 Oral     SpO2 07/17/22 1928 100 %     Weight --      Height --      Head Circumference --      Peak Flow --      Pain Score 07/17/22 1927 10     Pain Loc --      Pain Edu? --      Excl. in GC? --     Most recent vital signs: Vitals:   07/17/22 1928  BP: (!) 176/75  Pulse: 89  Resp: 20  Temp: 97.8 F (36.6 C)  SpO2: 100%     General: Awake, alert, oriented. CV:  Good peripheral perfusion.  Resp:  Normal effort.  Abd:  No distention.  Other:  Extreme tenderness to palpation of right knee/thigh. NV intact distally   ED Results / Procedures / Treatments   Labs (all labs ordered are listed, but only abnormal results are displayed) Labs Reviewed  CBC WITH DIFFERENTIAL/PLATELET - Abnormal; Notable for the following components:      Result Value   WBC 10.6 (*)    RBC 3.82 (*)    Hemoglobin 11.5 (*)    HCT 35.3 (*)    Neutro Abs 8.8 (*)    All other components within normal limits  BASIC METABOLIC PANEL - Abnormal; Notable for the following components:   Potassium 3.4 (*)    Glucose, Bld 132 (*)    Creatinine, Ser 1.16 (*)    Calcium 8.5 (*)    GFR, Estimated 49 (*)    All  other components within normal limits     EKG  None   RADIOLOGY I independently interpreted and visualized the right knee. My interpretation: comminuted fracture of distal femur Radiology interpretation:  IMPRESSION:  Acute comminuted fracture of the distal femur extending to the level  of the arthroplasty.    I independently interpreted and visualized the right hip. My interpretation: fracture of distal/mid femur. Radiology interpretation:  IMPRESSION:  No fracture of the proximal femur, hip or pelvis. Distal femur  fracture is partially included in the field of view.     PROCEDURES:  Critical Care performed: No  MEDICATIONS ORDERED IN ED: Medications - No data to display   IMPRESSION / MDM / ASSESSMENT AND PLAN / ED COURSE  I reviewed the triage vital signs and the nursing notes.  Differential diagnosis includes, but is not limited to, fracture dislocation contusion  Patient's presentation is most consistent with acute presentation with potential threat to life or bodily function.   The patient is on the cardiac monitor to evaluate for evidence of arrhythmia and/or significant heart rate changes.  Patient presented to the emergency department today because of concerns for right knee pain after a fall.  On exam patient has extreme tenderness to the right knee and thigh.  X-rays do show a comminuted fracture of the distal right femur.  Discussed with Dr. Audelia Acton with orthopedics who felt patient would benefit from ortho trauma evaluation. Patient initially requested [facility] where her knee replacement was done, however [facility] was at capacity and could not except. Discussed with Dr. Victorino Dike with orthopedics at [facility] who is okay with patient being transferred to [facility] but requested hospitalists admit.    FINAL CLINICAL IMPRESSION(S) / ED DIAGNOSES   Final diagnoses:  Closed fracture of right femur, unspecified fracture morphology,  unspecified portion of femur, initial encounter [facility])     Note:  This document was prepared using Dragon voice recognition software and may include unintentional dictation errors.    Phineas Semen, MD 07/17/22 2308

## 2022-07-17 NOTE — Progress Notes (Signed)
Hospitalist Transfer Note:  Transferring facility: [facility] Requesting provider: Dr. Derrill Kay (EDP at [facility]) Reason for transfer: admission for further evaluation and management of distal right femur fracture.    77 y.o.  female w/ h/o right TKA, DM2, HTN, who is being transferred from [facility] ED to [facility] for distal right femur fracture following ground-level mechanical fall after presenting to [facility] ED complaining of  right knee pain that start acutely after striking her right knee as principal called in contact with the floor as a result of aforementioned fall, in which she tripped while attempting to ambulate at home, without hitting her head.  Not reported to be on any blood thinners.  No associated loss of consciousness.  VS reported to be stable.  Lab reported to be unremarkable.  Plain films revealed evidence of distal right femur fracture.  In the setting of her history of right total knee arthroplasty, [facility] EDP spoke with orthopedic surgery at [facility] (Dr. Victorino Dike), who conveyed that orthopedic surgery will formally consult and see patient in the AM at [facility].  Subsequently, I accepted this patient for transfer for inpatient admission to a med-surg bed at [facility] for further work-up and management of the above.       Nursing staff, Please call [facility] Admits & Consults System-Wide number on Amion [phone]) as soon as patient's arrival, so appropriate admitting provider can evaluate the pt.     Newton Pigg, DO Hospitalist

## 2022-07-18 ENCOUNTER — Inpatient Hospital Stay (HOSPITAL_COMMUNITY)
Admit: 2022-07-18 | Discharge: 2022-07-23 | DRG: 480 | Disposition: A | Discharge status: Skilled Nursing Facility | Payer: Medicare HMO | Source: Other Acute Inpatient Hospital | Attending: Internal Medicine | Admitting: Internal Medicine

## 2022-07-18 DIAGNOSIS — Z8041 Family history of malignant neoplasm of ovary: Secondary | ICD-10-CM | POA: Diagnosis not present

## 2022-07-18 DIAGNOSIS — D62 Acute posthemorrhagic anemia: Secondary | ICD-10-CM | POA: Diagnosis not present

## 2022-07-18 DIAGNOSIS — Z8249 Family history of ischemic heart disease and other diseases of the circulatory system: Secondary | ICD-10-CM | POA: Diagnosis not present

## 2022-07-18 DIAGNOSIS — Z794 Long term (current) use of insulin: Secondary | ICD-10-CM

## 2022-07-18 DIAGNOSIS — S72401A Unspecified fracture of lower end of right femur, initial encounter for closed fracture: Secondary | ICD-10-CM | POA: Diagnosis not present

## 2022-07-18 DIAGNOSIS — Z825 Family history of asthma and other chronic lower respiratory diseases: Secondary | ICD-10-CM | POA: Diagnosis not present

## 2022-07-18 DIAGNOSIS — G928 Other toxic encephalopathy: Secondary | ICD-10-CM | POA: Diagnosis not present

## 2022-07-18 DIAGNOSIS — S7291XK Unspecified fracture of right femur, subsequent encounter for closed fracture with nonunion: Secondary | ICD-10-CM

## 2022-07-18 DIAGNOSIS — S72301A Unspecified fracture of shaft of right femur, initial encounter for closed fracture: Secondary | ICD-10-CM | POA: Diagnosis not present

## 2022-07-18 DIAGNOSIS — Z8639 Personal history of other endocrine, nutritional and metabolic disease: Secondary | ICD-10-CM

## 2022-07-18 DIAGNOSIS — Z96642 Presence of left artificial hip joint: Secondary | ICD-10-CM

## 2022-07-18 DIAGNOSIS — Z9884 Bariatric surgery status: Secondary | ICD-10-CM | POA: Diagnosis not present

## 2022-07-18 DIAGNOSIS — E669 Obesity, unspecified: Secondary | ICD-10-CM | POA: Diagnosis present

## 2022-07-18 DIAGNOSIS — Y92009 Unspecified place in unspecified non-institutional (private) residence as the place of occurrence of the external cause: Secondary | ICD-10-CM

## 2022-07-18 DIAGNOSIS — G4733 Obstructive sleep apnea (adult) (pediatric): Secondary | ICD-10-CM | POA: Diagnosis present

## 2022-07-18 DIAGNOSIS — S72451A Displaced supracondylar fracture without intracondylar extension of lower end of right femur, initial encounter for closed fracture: Secondary | ICD-10-CM | POA: Diagnosis not present

## 2022-07-18 DIAGNOSIS — R509 Fever, unspecified: Secondary | ICD-10-CM | POA: Diagnosis not present

## 2022-07-18 DIAGNOSIS — N183 Chronic kidney disease, stage 3 unspecified: Secondary | ICD-10-CM | POA: Diagnosis present

## 2022-07-18 DIAGNOSIS — N1832 Chronic kidney disease, stage 3b: Secondary | ICD-10-CM | POA: Diagnosis present

## 2022-07-18 DIAGNOSIS — T40695A Adverse effect of other narcotics, initial encounter: Secondary | ICD-10-CM | POA: Diagnosis present

## 2022-07-18 DIAGNOSIS — Z885 Allergy status to narcotic agent status: Secondary | ICD-10-CM | POA: Diagnosis not present

## 2022-07-18 DIAGNOSIS — M9711XA Periprosthetic fracture around internal prosthetic right knee joint, initial encounter: Secondary | ICD-10-CM | POA: Diagnosis not present

## 2022-07-18 DIAGNOSIS — F29 Unspecified psychosis not due to a substance or known physiological condition: Secondary | ICD-10-CM | POA: Diagnosis not present

## 2022-07-18 DIAGNOSIS — W19XXXD Unspecified fall, subsequent encounter: Secondary | ICD-10-CM | POA: Diagnosis not present

## 2022-07-18 DIAGNOSIS — M6281 Muscle weakness (generalized): Secondary | ICD-10-CM | POA: Diagnosis not present

## 2022-07-18 DIAGNOSIS — G4710 Hypersomnia, unspecified: Secondary | ICD-10-CM | POA: Diagnosis present

## 2022-07-18 DIAGNOSIS — S7291XA Unspecified fracture of right femur, initial encounter for closed fracture: Principal | ICD-10-CM | POA: Diagnosis present

## 2022-07-18 DIAGNOSIS — Z803 Family history of malignant neoplasm of breast: Secondary | ICD-10-CM

## 2022-07-18 DIAGNOSIS — Z9181 History of falling: Secondary | ICD-10-CM | POA: Diagnosis not present

## 2022-07-18 DIAGNOSIS — R2681 Unsteadiness on feet: Secondary | ICD-10-CM | POA: Diagnosis not present

## 2022-07-18 DIAGNOSIS — E119 Type 2 diabetes mellitus without complications: Secondary | ICD-10-CM | POA: Diagnosis present

## 2022-07-18 DIAGNOSIS — F419 Anxiety disorder, unspecified: Secondary | ICD-10-CM | POA: Diagnosis not present

## 2022-07-18 DIAGNOSIS — Z6833 Body mass index (BMI) 33.0-33.9, adult: Secondary | ICD-10-CM | POA: Diagnosis not present

## 2022-07-18 DIAGNOSIS — E1122 Type 2 diabetes mellitus with diabetic chronic kidney disease: Secondary | ICD-10-CM | POA: Diagnosis not present

## 2022-07-18 DIAGNOSIS — E1165 Type 2 diabetes mellitus with hyperglycemia: Secondary | ICD-10-CM | POA: Diagnosis present

## 2022-07-18 DIAGNOSIS — R2689 Other abnormalities of gait and mobility: Secondary | ICD-10-CM | POA: Diagnosis not present

## 2022-07-18 DIAGNOSIS — I129 Hypertensive chronic kidney disease with stage 1 through stage 4 chronic kidney disease, or unspecified chronic kidney disease: Secondary | ICD-10-CM | POA: Diagnosis present

## 2022-07-18 DIAGNOSIS — R262 Difficulty in walking, not elsewhere classified: Secondary | ICD-10-CM | POA: Diagnosis not present

## 2022-07-18 DIAGNOSIS — Z96651 Presence of right artificial knee joint: Secondary | ICD-10-CM | POA: Diagnosis not present

## 2022-07-18 DIAGNOSIS — S7291XD Unspecified fracture of right femur, subsequent encounter for closed fracture with routine healing: Secondary | ICD-10-CM | POA: Diagnosis not present

## 2022-07-18 DIAGNOSIS — N1831 Chronic kidney disease, stage 3a: Secondary | ICD-10-CM | POA: Diagnosis not present

## 2022-07-18 DIAGNOSIS — W010XXA Fall on same level from slipping, tripping and stumbling without subsequent striking against object, initial encounter: Secondary | ICD-10-CM | POA: Diagnosis present

## 2022-07-18 DIAGNOSIS — R1311 Dysphagia, oral phase: Secondary | ICD-10-CM | POA: Diagnosis not present

## 2022-07-18 DIAGNOSIS — E6609 Other obesity due to excess calories: Secondary | ICD-10-CM | POA: Diagnosis not present

## 2022-07-18 DIAGNOSIS — Z741 Need for assistance with personal care: Secondary | ICD-10-CM | POA: Diagnosis not present

## 2022-07-18 DIAGNOSIS — Z6836 Body mass index (BMI) 36.0-36.9, adult: Secondary | ICD-10-CM

## 2022-07-18 DIAGNOSIS — Z743 Need for continuous supervision: Secondary | ICD-10-CM | POA: Diagnosis not present

## 2022-07-18 LAB — TYPE AND SCREEN
ABO/RH(D): A POS
Antibody Screen: NEGATIVE

## 2022-07-18 LAB — CBC
HCT: 33.5 % — ABNORMAL LOW (ref 36.0–46.0)
Hemoglobin: 10.8 g/dL — ABNORMAL LOW (ref 12.0–15.0)
MCH: 29.5 pg (ref 26.0–34.0)
MCHC: 32.2 g/dL (ref 30.0–36.0)
MCV: 91.5 fL (ref 80.0–100.0)
Platelets: 206 10*3/uL (ref 150–400)
RBC: 3.66 MIL/uL — ABNORMAL LOW (ref 3.87–5.11)
RDW: 13.1 % (ref 11.5–15.5)
WBC: 7 10*3/uL (ref 4.0–10.5)
nRBC: 0 % (ref 0.0–0.2)

## 2022-07-18 LAB — BLOOD GAS, VENOUS
Acid-Base Excess: 1.7 mmol/L (ref 0.0–2.0)
Bicarbonate: 30.1 mmol/L — ABNORMAL HIGH (ref 20.0–28.0)
Drawn by: 4956
O2 Saturation: 19.9 %
Patient temperature: 37
pCO2, Ven: 64 mmHg — ABNORMAL HIGH (ref 44–60)
pH, Ven: 7.28 (ref 7.25–7.43)
pO2, Ven: <31 mmHg — CL (ref 32–45)

## 2022-07-18 LAB — BASIC METABOLIC PANEL
Anion gap: 10 (ref 5–15)
BUN: 22 mg/dL (ref 8–23)
CO2: 24 mmol/L (ref 22–32)
Calcium: 8.5 mg/dL — ABNORMAL LOW (ref 8.9–10.3)
Chloride: 104 mmol/L (ref 98–111)
Creatinine, Ser: 1.29 mg/dL — ABNORMAL HIGH (ref 0.44–1.00)
GFR, Estimated: 43 mL/min — ABNORMAL LOW (ref >60)
Glucose, Bld: 251 mg/dL — ABNORMAL HIGH (ref 70–99)
Potassium: 4 mmol/L (ref 3.5–5.1)
Sodium: 138 mmol/L (ref 135–145)

## 2022-07-18 LAB — MAGNESIUM: Magnesium: 1.8 mg/dL (ref 1.7–2.4)

## 2022-07-18 LAB — PHOSPHORUS: Phosphorus: 4.4 mg/dL (ref 2.5–4.6)

## 2022-07-18 LAB — GLUCOSE, CAPILLARY
Glucose-Capillary: 206 mg/dL — ABNORMAL HIGH (ref 70–99)
Glucose-Capillary: 152 mg/dL — ABNORMAL HIGH (ref 70–99)
Glucose-Capillary: 167 mg/dL — ABNORMAL HIGH (ref 70–99)
Glucose-Capillary: 109 mg/dL — ABNORMAL HIGH (ref 70–99)

## 2022-07-18 LAB — ABO/RH: ABO/RH(D): A POS

## 2022-07-18 LAB — PROTIME-INR
INR: 1.1 (ref 0.8–1.2)
Prothrombin Time: 14.4 seconds (ref 11.4–15.2)

## 2022-07-18 LAB — SURGICAL PCR SCREEN
MRSA, PCR: NEGATIVE
Staphylococcus aureus: NEGATIVE

## 2022-07-18 MED ORDER — CEFAZOLIN SODIUM-DEXTROSE 2-4 GM/100ML-% IV SOLN
2.0000 g | INTRAVENOUS | Status: AC
  Administered 2022-07-19: 2 g via INTRAVENOUS
  Filled 2022-07-18: qty 100

## 2022-07-18 MED ORDER — OXYCODONE-ACETAMINOPHEN 5-325 MG PO TABS
1.0000 | ORAL_TABLET | Freq: Four times a day (QID) | ORAL | Status: DC | PRN
  Administered 2022-07-18: 2 via ORAL
  Filled 2022-07-18: qty 2

## 2022-07-18 MED ORDER — POVIDONE-IODINE 10 % EX SWAB: 2.0000 | Freq: Once | CUTANEOUS | Status: DC

## 2022-07-18 MED ORDER — OXYCODONE-ACETAMINOPHEN 5-325 MG PO TABS
1.0000 | ORAL_TABLET | ORAL | Status: DC | PRN
  Administered 2022-07-18 – 2022-07-23 (×14): 1 via ORAL
  Filled 2022-07-18 (×13): qty 1

## 2022-07-18 MED ORDER — INSULIN ASPART 100 UNIT/ML IJ SOLN
0.0000 [IU] | INTRAMUSCULAR | Status: DC
  Administered 2022-07-18: 3 [IU] via SUBCUTANEOUS
  Administered 2022-07-18: 5 [IU] via SUBCUTANEOUS
  Administered 2022-07-18 – 2022-07-19 (×2): 2 [IU] via SUBCUTANEOUS
  Administered 2022-07-19: 5 [IU] via SUBCUTANEOUS
  Administered 2022-07-19: 8 [IU] via SUBCUTANEOUS
  Administered 2022-07-19 – 2022-07-20 (×2): 2 [IU] via SUBCUTANEOUS
  Administered 2022-07-20 (×2): 5 [IU] via SUBCUTANEOUS
  Administered 2022-07-20 – 2022-07-21 (×2): 2 [IU] via SUBCUTANEOUS

## 2022-07-18 MED ORDER — SODIUM CHLORIDE 0.9 % IV SOLN: INTRAVENOUS | Status: AC

## 2022-07-18 MED ORDER — PROCHLORPERAZINE EDISYLATE 10 MG/2ML IJ SOLN
5.0000 mg | Freq: Four times a day (QID) | INTRAMUSCULAR | Status: DC | PRN
  Administered 2022-07-18: 5 mg via INTRAVENOUS
  Filled 2022-07-18: qty 2

## 2022-07-18 MED ORDER — CHLORHEXIDINE GLUCONATE 4 % EX SOLN: 60.0000 mL | Freq: Once | CUTANEOUS | Status: DC

## 2022-07-18 MED ORDER — ONDANSETRON HCL 4 MG/2ML IJ SOLN
INTRAMUSCULAR | Status: AC
  Filled 2022-07-18: qty 2

## 2022-07-18 MED ORDER — PANTOPRAZOLE SODIUM 40 MG PO TBEC
40.0000 mg | DELAYED_RELEASE_TABLET | Freq: Every day | ORAL | Status: DC
  Administered 2022-07-20 – 2022-07-23 (×4): 40 mg via ORAL
  Filled 2022-07-18 (×5): qty 1

## 2022-07-18 MED ORDER — POLYETHYLENE GLYCOL 3350 17 G PO PACK: 17.0000 g | PACK | Freq: Every day | ORAL | Status: DC | PRN

## 2022-07-18 MED ORDER — ATORVASTATIN CALCIUM 10 MG PO TABS
10.0000 mg | ORAL_TABLET | Freq: Every day | ORAL | Status: DC
  Administered 2022-07-20 – 2022-07-23 (×4): 10 mg via ORAL
  Filled 2022-07-18 (×5): qty 1

## 2022-07-18 MED ORDER — TRANEXAMIC ACID-NACL 1000-0.7 MG/100ML-% IV SOLN
1000.0000 mg | INTRAVENOUS | Status: AC
  Administered 2022-07-19: 1000 mg via INTRAVENOUS
  Filled 2022-07-18: qty 100

## 2022-07-18 MED ORDER — ACETAMINOPHEN 325 MG PO TABS
650.0000 mg | ORAL_TABLET | Freq: Four times a day (QID) | ORAL | Status: DC | PRN
  Filled 2022-07-18: qty 2

## 2022-07-18 NOTE — Progress Notes (Signed)
   07/18/22 2358  BiPAP/CPAP/SIPAP  Reason BIPAP/CPAP not in use Non-compliant (PT refused for tonight.)

## 2022-07-18 NOTE — Progress Notes (Signed)
Same day note  Jodi [name] is a 76 y.o. female with medical history significant for right total knee replacement, type 2 diabetes, hypertension, obesity, OSA on CPAP, CKD 3B, who had initially presented to [facility] ED with complaints of a fall and right knee pain.  She tripped while trying to put a leash on a dog and landed on her right knee.  In the ED, right knee x-ray revealed acute comminuted fracture of the distal femur extending to the level of the arthroplasty.  EDP discussed the case with orthopedic surgery at [facility] who felt patient would benefit from Ortho trauma evaluation.  The patient initially requested [facility] where she previously had her knee replacement, but they did not have any beds and could not accept her.  EDP also discussed the case with orthopedic surgery at [facility] Dr. Hewitt, who recommended transfer to [facility] and admission by the hospitalist service.  In the ED, patient was hypersomnolent likely from narcotics and was put on CPAP.  Patient was afebrile with stable vitals.  Hyperglycemia with glucose of 251.  Creatinine of 129 and hemoglobin was 10.8.   Patient seen and examined at bedside.  Patient was admitted to the hospital for fall and fracture.  At the time of my evaluation, patient complains of pain at the fracture site.  On CPAP mask but able to converse.  Physical examination reveals elderly female, average built, on CPAP, right lower extremity-knee scar, tenderness on palpation.  Laboratory data and imaging was reviewed  Assessment and Plan.  Acute comminuted fracture of the right distal femur extending to the level of the arthroplasty, Orthopedic surgery Dr. Hewitt, recommended transfer from [facility] to [facility] for surgical evaluation.   Hold off narcotics and sedatives due to hypersomnolence.  Seen by orthopedics and will continue knee immobilizer.  Keep n.p.o. after midnight for possible ORIF tomorrow with Dr. Haddix.   Acute metabolic  encephalopathy, hypersomnolence, suspect side effect from narcotics  Avoid narcotics for now.  Currently on CPAP.  Saturating 98% on 2 L.  ABG showed low oxygenation.  Patient is alert awake and Communicative.   Type 2 diabetes with hyperglycemia Continue IV hydration sliding scale insulin.  Check hemoglobin A1c.   CKD 3b Creatinine on presentation at 1.2.  Likely at baseline.  Continue hydration for now.   Obesity Body mass index is 36.44 kg/m.  Would recommend outpatient follow-up for weight loss management.  No Charge  Signed,  Lesley Galentine Raj Obbie Lewallen, MD [facility]  

## 2022-07-18 NOTE — Progress Notes (Signed)
Called to patients room to place her on CPAP due to being drowsy and SP02 dropping when she doses off.  Placed on CPAP auto titrate with 3 lpm bled in.  Pt tolerating well.

## 2022-07-18 NOTE — H&P (View-Only) (Signed)
Reason for Consult:Right distal femur fx Referring Physician: Laxman Pokhrel Time called: 1003 Time at bedside: 1019   Jodi [name] is an 76 y.o. female.  HPI: Tericka fell at home when she was trying to put a leash on her dog. She had immediate pain in her knee and could not bear weight. She was brought to the ED where x-rays showed a periprosthetic distal femur fx and orthopedic surgery was consulted.   Past Medical History:  Diagnosis Date   Anxiety    Arthritis    Diabetes mellitus without complication (HCC)    diet controlled since gastric bypass   Hypertension    Sleep apnea     Past Surgical History:  Procedure Laterality Date   BLADDER SUSPENSION     CHOLECYSTECTOMY     COLONOSCOPY WITH PROPOFOL N/A 07/16/2016   Procedure: COLONOSCOPY WITH PROPOFOL;  Surgeon: Anna, Kiran, MD;  Location: [facility] ENDOSCOPY;  Service: Endoscopy;  Laterality: N/A;   JOINT REPLACEMENT     left hip, right knee   ROUX-EN-Y PROCEDURE  2010   tracheotomy     at age 6 due to respiratroy failure    Family History  Problem Relation Age of Onset   COPD Mother    Hypertension Mother    Ovarian cancer Mother 64       ovarian CA   Breast cancer Daughter    Breast cancer Paternal Aunt    Breast cancer Paternal Grandmother    Cancer Sister     Social History:  reports that she has never smoked. She has never used smokeless tobacco. She reports that she does not drink alcohol and does not use drugs.  Allergies:  Allergies  Allergen Reactions   Morphine And Codeine Nausea And Vomiting    Medications: I have reviewed the patient's current medications.  Results for orders placed or performed during the hospital encounter of 07/18/22 (from the past 48 hour(s))  ABO/Rh     Status: None   Collection Time: 07/18/22  3:59 AM  Result Value Ref Range   ABO/RH(D)      A POS Performed at [facility], [address] [place], [place] [address]   CBC     Status: Abnormal   Collection Time:  07/18/22  4:00 AM  Result Value Ref Range   WBC 7.0 4.0 - 10.5 K/uL   RBC 3.66 (L) 3.87 - 5.11 MIL/uL   Hemoglobin 10.8 (L) 12.0 - 15.0 g/dL   HCT 33.5 (L) 36.0 - 46.0 %   MCV 91.5 80.0 - 100.0 fL   MCH 29.5 26.0 - 34.0 pg   MCHC 32.2 30.0 - 36.0 g/dL   RDW 13.1 11.5 - 15.5 %   Platelets 206 150 - 400 K/uL   nRBC 0.0 0.0 - 0.2 %    Comment: Performed at [facility], [address] [place], [place] [address]  Basic metabolic panel     Status: Abnormal   Collection Time: 07/18/22  4:00 AM  Result Value Ref Range   Sodium 138 135 - 145 mmol/L   Potassium 4.0 3.5 - 5.1 mmol/L   Chloride 104 98 - 111 mmol/L   CO2 24 22 - 32 mmol/L   Glucose, Bld 251 (H) 70 - 99 mg/dL    Comment: Glucose reference range applies only to samples taken after fasting for at least 8 hours.   BUN 22 8 - 23 mg/dL   Creatinine, Ser 1.29 (H) 0.44 - 1.00 mg/dL     Calcium 8.5 (L) 8.9 - 10.3 mg/dL   GFR, Estimated 43 (L) >60 mL/min    Comment: (NOTE) Calculated using the CKD-EPI Creatinine Equation (2021)    Anion gap 10 5 - 15    Comment: Performed at [facility], [address] [place], [place] [address]  Magnesium     Status: None   Collection Time: 07/18/22  4:00 AM  Result Value Ref Range   Magnesium 1.8 1.7 - 2.4 mg/dL    Comment: Performed at [facility], [address] [place], [place] [address]  Phosphorus     Status: None   Collection Time: 07/18/22  4:00 AM  Result Value Ref Range   Phosphorus 4.4 2.5 - 4.6 mg/dL    Comment: Performed at [facility], [address] [place], [place] [address]  Surgical pcr screen     Status: None   Collection Time: 07/18/22  4:40 AM   Specimen: Nasal Mucosa; Nasal Swab  Result Value Ref Range   MRSA, PCR NEGATIVE NEGATIVE   Staphylococcus aureus NEGATIVE NEGATIVE    Comment: (NOTE) The Xpert SA Assay (FDA approved for NASAL specimens in patients 22 years of age and older), is one component of a comprehensive surveillance  program. It is not intended to diagnose infection nor to guide or monitor treatment. Performed at [facility], [address] [place], [place] [address]   Glucose, capillary     Status: Abnormal   Collection Time: 07/18/22  5:45 AM  Result Value Ref Range   Glucose-Capillary 206 (H) 70 - 99 mg/dL    Comment: Glucose reference range applies only to samples taken after fasting for at least 8 hours.   Comment 1 Notify RN    Comment 2 Document in Chart   Type and screen [facility]     Status: None   Collection Time: 07/18/22  9:21 AM  Result Value Ref Range   ABO/RH(D) A POS    Antibody Screen NEG    Sample Expiration      07/21/2022,2359 Performed at [facility], [address] [place], [place] [address]     DG Chest Portable 1 View  Result Date: 07/17/2022 CLINICAL DATA:  Femur fracture after fall.  Preop. EXAM: PORTABLE CHEST 1 VIEW COMPARISON:  None Available. FINDINGS: The heart is normal in size for technique.The cardiomediastinal contours are normal. The lungs are clear. Pulmonary vasculature is normal. No consolidation, pleural effusion, or pneumothorax. No acute osseous abnormalities are seen. IMPRESSION: No acute chest findings. Electronically Signed   By: Melanie  Sanford M.D.   On: 07/17/2022 21:11   DG Hip Unilat W or Wo Pelvis 2-3 Views Right  Result Date: 07/17/2022 CLINICAL DATA:  Right-sided hip and knee pain after fall. EXAM: DG HIP (WITH OR WITHOUT PELVIS) 2-3V RIGHT COMPARISON:  None Available. FINDINGS: Distal femur fracture is partially included in the field of view. No fracture of the proximal femur or hip. No pelvic fracture. Previous left hip arthroplasty. Bladder tack. Pre sacral stimulator. The pubic rami are intact. Pubic symphysis and sacroiliac joints are congruent. IMPRESSION: No fracture of the proximal femur, hip or pelvis. Distal femur fracture is partially included in the field of view. Electronically Signed   By: Melanie   Sanford M.D.   On: 07/17/2022 21:07   DG Knee Complete 4 Views Right  Result Date: 07/17/2022 CLINICAL DATA:  Fall EXAM: RIGHT KNEE - COMPLETE 4+ VIEW COMPARISON:  None   Available. FINDINGS: Right knee total arthroplasty is present. There is an acute comminuted fracture of the distal femur extending to the level of the arthroplasty. There is 1 shaft with medial and posterior displacement of the distal fracture fragment with apex lateral angulation. There is surrounding soft tissue swelling. There is no dislocation. IMPRESSION: Acute comminuted fracture of the distal femur extending to the level of the arthroplasty. Electronically Signed   By: Amy  Guttmann M.D.   On: 07/17/2022 21:06    Review of Systems  Unable to perform ROS: Severe respiratory distress   Blood pressure 130/60, pulse 95, temperature 98.8 F (37.1 C), resp. rate 18, height 5' 4" (1.626 m), weight 96.3 kg, SpO2 100%. Physical Exam Constitutional:      General: She is not in acute distress.    Appearance: She is well-developed. She is not diaphoretic.  HENT:     Head: Normocephalic and atraumatic.  Eyes:     General: No scleral icterus.       Right eye: No discharge.        Left eye: No discharge.     Conjunctiva/sclera: Conjunctivae normal.  Cardiovascular:     Rate and Rhythm: Normal rate and regular rhythm.  Pulmonary:     Effort: Pulmonary effort is normal. No respiratory distress.  Musculoskeletal:     Cervical back: Normal range of motion.     Comments: RLE No traumatic wounds, ecchymosis, or rash  Mod TTP knee  No ankle effusion  Sens DPN, SPN, TN intact  Motor EHL, ext, flex, evers 5/5  DP 2+, PT 2+, No significant edema  Skin:    General: Skin is warm and dry.  Neurological:     Mental Status: She is alert.  Psychiatric:        Mood and Affect: Mood normal.        Behavior: Behavior normal.     Assessment/Plan: Right distal femur fx -- Will place in KI. Plan ORIF tomorrow with Dr. Haddix. Please  keep NPO after MN. Multiple medical problems including type 2 diabetes, hypertension, obesity, OSA on CPAP, CKD 3B -- per primary service    Perfecto Purdy J. Saulo Anthis, PA-C Orthopedic Surgery [phone] 07/18/2022, 10:45 AM  

## 2022-07-18 NOTE — Progress Notes (Addendum)
Patient received from [facility] regional ED comminuted fracture of distal femur in 2 Lit oxygen via [place]. Patient hard of hearing, drowsy but arousable, oriented x 4, complaining of pain with score 10, nausea with dry heaves, vital signs stable. Will continue to monitor the patient.

## 2022-07-18 NOTE — TOC CAGE-AID Note (Signed)
Transition of Care [facility]) - CAGE-AID Screening   Patient Details  Name: Jodi [name] MRN: [id number] Date of Birth: 1945/12/25  Transition of Care [facility] CM/SW Contact:    Janora Norlander, RN Phone Number: [phone] 07/18/2022, 5:25 PM   Clinical Narrative: Pt here after sustaining an acute comminuted fracture of her distal femur due to a fall.  Pt denies alcohol or drug use.  No resources needed.  Screening complete.   CAGE-AID Screening:    Have You Ever Felt You Ought to Cut Down on Your Drinking or Drug Use?: No Have People Annoyed You By Critizing Your Drinking Or Drug Use?: No Have You Felt Bad Or Guilty About Your Drinking Or Drug Use?: No Have You Ever Had a Drink or Used Drugs First Thing In The Morning to Steady Your Nerves or to Get Rid of a Hangover?: No CAGE-AID Score: 0  Substance Abuse Education Offered: No

## 2022-07-18 NOTE — Consult Note (Signed)
Reason for Consult:Right distal femur fx Referring Physician: Rebekah Chesterfield Pokhrel Time called: 1003 Time at bedside: 1019   Jodi [name] is an 77 y.o. female.  HPI: Ahilyn fell at home when she was trying to put a leash on her dog. She had immediate pain in her knee and could not bear weight. She was brought to the ED where x-rays showed a periprosthetic distal femur fx and orthopedic surgery was consulted.   Past Medical History:  Diagnosis Date   Anxiety    Arthritis    Diabetes mellitus without complication (HCC)    diet controlled since gastric bypass   Hypertension    Sleep apnea     Past Surgical History:  Procedure Laterality Date   BLADDER SUSPENSION     CHOLECYSTECTOMY     COLONOSCOPY WITH PROPOFOL N/A 07/16/2016   Procedure: COLONOSCOPY WITH PROPOFOL;  Surgeon: Wyline Mood, MD;  Location: [facility] ENDOSCOPY;  Service: Endoscopy;  Laterality: N/A;   JOINT REPLACEMENT     left hip, right knee   ROUX-EN-Y PROCEDURE  2010   tracheotomy     at age 42 due to respiratroy failure    Family History  Problem Relation Age of Onset   COPD Mother    Hypertension Mother    Ovarian cancer Mother 69       ovarian CA   Breast cancer Daughter    Breast cancer Paternal Aunt    Breast cancer Paternal Grandmother    Cancer Sister     Social History:  reports that she has never smoked. She has never used smokeless tobacco. She reports that she does not drink alcohol and does not use drugs.  Allergies:  Allergies  Allergen Reactions   Morphine And Codeine Nausea And Vomiting    Medications: I have reviewed the patient's current medications.  Results for orders placed or performed during the hospital encounter of 07/18/22 (from the past 48 hour(s))  ABO/Rh     Status: None   Collection Time: 07/18/22  3:59 AM  Result Value Ref Range   ABO/RH(D)      A POS Performed at [facility], [address], [place], [place] [address]   CBC     Status: Abnormal   Collection Time:  07/18/22  4:00 AM  Result Value Ref Range   WBC 7.0 4.0 - 10.5 K/uL   RBC 3.66 (L) 3.87 - 5.11 MIL/uL   Hemoglobin 10.8 (L) 12.0 - 15.0 g/dL   HCT 09.8 (L) 11.9 - 14.7 %   MCV 91.5 80.0 - 100.0 fL   MCH 29.5 26.0 - 34.0 pg   MCHC 32.2 30.0 - 36.0 g/dL   RDW 82.9 56.2 - 13.0 %   Platelets 206 150 - 400 K/uL   nRBC 0.0 0.0 - 0.2 %    Comment: Performed at [facility], [address], [place], [place] [address]  Basic metabolic panel     Status: Abnormal   Collection Time: 07/18/22  4:00 AM  Result Value Ref Range   Sodium 138 135 - 145 mmol/L   Potassium 4.0 3.5 - 5.1 mmol/L   Chloride 104 98 - 111 mmol/L   CO2 24 22 - 32 mmol/L   Glucose, Bld 251 (H) 70 - 99 mg/dL    Comment: Glucose reference range applies only to samples taken after fasting for at least 8 hours.   BUN 22 8 - 23 mg/dL   Creatinine, Ser 4.69 (H) 0.44 - 1.00 mg/dL  Calcium 8.5 (L) 8.9 - 10.3 mg/dL   GFR, Estimated 43 (L) >60 mL/min    Comment: (NOTE) Calculated using the CKD-EPI Creatinine Equation (2021)    Anion gap 10 5 - 15    Comment: Performed at [facility], [address] [place], [place] [address]  Magnesium     Status: None   Collection Time: 07/18/22  4:00 AM  Result Value Ref Range   Magnesium 1.8 1.7 - 2.4 mg/dL    Comment: Performed at [facility], [address], [place], [place] [address]  Phosphorus     Status: None   Collection Time: 07/18/22  4:00 AM  Result Value Ref Range   Phosphorus 4.4 2.5 - 4.6 mg/dL    Comment: Performed at [facility], [address] [place], [place] [address]  Surgical pcr screen     Status: None   Collection Time: 07/18/22  4:40 AM   Specimen: Nasal Mucosa; Nasal Swab  Result Value Ref Range   MRSA, PCR NEGATIVE NEGATIVE   Staphylococcus aureus NEGATIVE NEGATIVE    Comment: (NOTE) The Xpert SA Assay (FDA approved for NASAL specimens in patients 57 years of age and older), is one component of a comprehensive surveillance  program. It is not intended to diagnose infection nor to guide or monitor treatment. Performed at [facility], [address], [place], [place] [address]   Glucose, capillary     Status: Abnormal   Collection Time: 07/18/22  5:45 AM  Result Value Ref Range   Glucose-Capillary 206 (H) 70 - 99 mg/dL    Comment: Glucose reference range applies only to samples taken after fasting for at least 8 hours.   Comment 1 Notify RN    Comment 2 Document in Chart   Type and screen [facility]     Status: None   Collection Time: 07/18/22  9:21 AM  Result Value Ref Range   ABO/RH(D) A POS    Antibody Screen NEG    Sample Expiration      07/21/2022,2359 Performed at [facility], [address], [place], [place] [address]     DG Chest Portable 1 View  Result Date: 07/17/2022 CLINICAL DATA:  Femur fracture after fall.  Preop. EXAM: PORTABLE CHEST 1 VIEW COMPARISON:  None Available. FINDINGS: The heart is normal in size for technique.The cardiomediastinal contours are normal. The lungs are clear. Pulmonary vasculature is normal. No consolidation, pleural effusion, or pneumothorax. No acute osseous abnormalities are seen. IMPRESSION: No acute chest findings. Electronically Signed   By: Narda Rutherford M.D.   On: 07/17/2022 21:11   DG Hip Unilat W or Wo Pelvis 2-3 Views Right  Result Date: 07/17/2022 CLINICAL DATA:  Right-sided hip and knee pain after fall. EXAM: DG HIP (WITH OR WITHOUT PELVIS) 2-3V RIGHT COMPARISON:  None Available. FINDINGS: Distal femur fracture is partially included in the field of view. No fracture of the proximal femur or hip. No pelvic fracture. Previous left hip arthroplasty. Bladder tack. Pre sacral stimulator. The pubic rami are intact. Pubic symphysis and sacroiliac joints are congruent. IMPRESSION: No fracture of the proximal femur, hip or pelvis. Distal femur fracture is partially included in the field of view. Electronically Signed   By: Narda Rutherford M.D.   On: 07/17/2022 21:07   DG Knee Complete 4 Views Right  Result Date: 07/17/2022 CLINICAL DATA:  Fall EXAM: RIGHT KNEE - COMPLETE 4+ VIEW COMPARISON:  None  Available. FINDINGS: Right knee total arthroplasty is present. There is an acute comminuted fracture of the distal femur extending to the level of the arthroplasty. There is 1 shaft with medial and posterior displacement of the distal fracture fragment with apex lateral angulation. There is surrounding soft tissue swelling. There is no dislocation. IMPRESSION: Acute comminuted fracture of the distal femur extending to the level of the arthroplasty. Electronically Signed   By: Darliss Cheney M.D.   On: 07/17/2022 21:06    Review of Systems  Unable to perform ROS: Severe respiratory distress   Blood pressure 130/60, pulse 95, temperature 98.8 F (37.1 C), resp. rate 18, height 5\' 4"  (1.626 m), weight 96.3 kg, SpO2 100%. Physical Exam Constitutional:      General: She is not in acute distress.    Appearance: She is well-developed. She is not diaphoretic.  HENT:     Head: Normocephalic and atraumatic.  Eyes:     General: No scleral icterus.       Right eye: No discharge.        Left eye: No discharge.     Conjunctiva/sclera: Conjunctivae normal.  Cardiovascular:     Rate and Rhythm: Normal rate and regular rhythm.  Pulmonary:     Effort: Pulmonary effort is normal. No respiratory distress.  Musculoskeletal:     Cervical back: Normal range of motion.     Comments: RLE No traumatic wounds, ecchymosis, or rash  Mod TTP knee  No ankle effusion  Sens DPN, SPN, TN intact  Motor EHL, ext, flex, evers 5/5  DP 2+, PT 2+, No significant edema  Skin:    General: Skin is warm and dry.  Neurological:     Mental Status: She is alert.  Psychiatric:        Mood and Affect: Mood normal.        Behavior: Behavior normal.     Assessment/Plan: Right distal femur fx -- Will place in KI. Plan ORIF tomorrow with Dr. Jena Gauss. Please  keep NPO after MN. Multiple medical problems including type 2 diabetes, hypertension, obesity, OSA on CPAP, CKD 3B -- per primary service    Freeman Caldron, PA-C Orthopedic Surgery [phone] 07/18/2022, 10:45 AM

## 2022-07-18 NOTE — Plan of Care (Signed)
  Problem: Health Behavior/Discharge Planning: Goal: Ability to manage health-related needs will improve Outcome: Progressing   Problem: Clinical Measurements: Goal: Respiratory complications will improve Outcome: Progressing   Problem: Coping: Goal: Level of anxiety will decrease Outcome: Progressing   Problem: Pain Managment: Goal: General experience of comfort will improve Outcome: Progressing   

## 2022-07-18 NOTE — H&P (Addendum)
History and Physical  Jodi [name] IEP:[id number] DOB: Mar 14, 1945 DOA: 07/18/2022  Referring physician: Transfer from [facility] ED to [facility] Telemetry surgical unit  PCP: Sherlene Shams, MD  Outpatient Specialists: Cardiology Patient coming from: Direct transfer from [facility] ED.  Chief Complaint: Fall  HPI: Jodi [name] is a 77 y.o. female with medical history significant for right total knee replacement, type 2 diabetes, hypertension, obesity, OSA on CPAP, CKD 3B, who initially presented to [facility] ED with complaints of a fall and right knee pain.  She tripped while trying to put a leash on a dog and landed on her right knee.  Right away she heard a pop and had immediate onset of pain to her right knee.  Denies hitting her head.  No loss of consciousness.  She was brought into the ED for further evaluation.  In the ED, right knee x-ray revealed acute comminuted fracture of the distal femur extending to the level of the arthroplasty.  EDP discussed the case with orthopedic surgery at [facility] who felt patient would benefit from Ortho trauma evaluation.  The patient initially requested [facility] where she previously had her knee replacement, but they did not have any beds and could not accept her.  EDP also discussed the case with orthopedic surgery at [facility] Dr. Victorino Dike, who recommended transfer to [facility] and admission by the hospitalist service.  Admitted by [facility], Dr. Arlean Hopping, and transferred to telemetry surgical unit as inpatient status.  At the time of this visit, the patient is hypersomnolent, suspect from narcotics.  Placed on CPAP.  VBG ordered to assess for pH and pCO2 levels.  Currently not complaining of pain.  ED Course: Temperature 97.9.  BP 117/39, pulse 77, respiratory 18, O2 saturation 100% on room air.  Lab studies remarkable for serum glucose 251, creatinine 1.29, GFR 43.  Hemoglobin 10.8.  Review of Systems: Review of systems as noted in the HPI. All other systems  reviewed and are negative.   Past Medical History:  Diagnosis Date   Anxiety    Arthritis    Diabetes mellitus without complication (HCC)    diet controlled since gastric bypass   Hypertension    Sleep apnea    Past Surgical History:  Procedure Laterality Date   BLADDER SUSPENSION     CHOLECYSTECTOMY     COLONOSCOPY WITH PROPOFOL N/A 07/16/2016   Procedure: COLONOSCOPY WITH PROPOFOL;  Surgeon: Wyline Mood, MD;  Location: [facility] ENDOSCOPY;  Service: Endoscopy;  Laterality: N/A;   JOINT REPLACEMENT     left hip, right knee   ROUX-EN-Y PROCEDURE  2010   tracheotomy     at age 60 due to respiratroy failure    Social History:  reports that she has never smoked. She has never used smokeless tobacco. She reports that she does not drink alcohol and does not use drugs.   Allergies  Allergen Reactions   Morphine And Codeine Nausea And Vomiting    Family History  Problem Relation Age of Onset   COPD Mother    Hypertension Mother    Ovarian cancer Mother 16       ovarian CA   Breast cancer Daughter    Breast cancer Paternal Aunt    Breast cancer Paternal Grandmother    Cancer Sister       Prior to Admission medications   Medication Sig Start Date End Date Taking? Authorizing Provider  acetaminophen (TYLENOL) 650 MG CR tablet Take 650 mg by mouth every 8 (  eight) hours as needed for pain.    [provider]  amitriptyline (ELAVIL) 50 MG tablet TAKE 1 TABLET BY MOUTH EVERYDAY AT BEDTIME 06/17/22   Sherlene Shams, MD  atorvastatin (LIPITOR) 10 MG tablet TAKE 1 TABLET BY MOUTH EVERY DAY 06/17/22   Sherlene Shams, MD  B Complex-C-Folic Acid (EQL SUPER B COMPLEX/VITAMIN C PO) Take 1 tablet by mouth daily.    [provider]  buPROPion (WELLBUTRIN SR) 150 MG 12 hr tablet TAKE 1 TABLET BY MOUTH TWICE A DAY 06/17/22   Sherlene Shams, MD  clonazePAM (KLONOPIN) 0.5 MG tablet TAKE 1 TABLET (0.5 MG TOTAL) BY MOUTH AT BEDTIME. AS NEEDED FOR ANXIETY/INSOMNIA 03/04/22   Sherlene Shams, MD  fluticasone (FLONASE) 50 MCG/ACT nasal spray SPRAY 2 SPRAYS INTO EACH NOSTRIL EVERY DAY 12/20/19   Sherlene Shams, MD  gabapentin (NEURONTIN) 100 MG capsule TAKE 1 CAPSULE BY MOUTH THREE TIMES A DAY 04/09/22   Sherlene Shams, MD  losartan-hydrochlorothiazide (HYZAAR) 50-12.5 MG tablet TAKE 1 TABLET BY MOUTH EVERY DAY 04/09/22   Sherlene Shams, MD  Multiple Vitamins-Minerals (WOMENS ONE DAILY PO) Take 1 tablet by mouth daily.    [provider]  omeprazole (PRILOSEC) 20 MG capsule TAKE 1 CAPSULE BY MOUTH TWICE A DAY BEFORE MEALS 03/15/21   Worthy Rancher B, FNP  oxybutynin (DITROPAN-XL) 5 MG 24 hr tablet TAKE 1 TABLET BY MOUTH EVERYDAY AT BEDTIME 07/16/22   Sherlene Shams, MD  sertraline (ZOLOFT) 100 MG tablet TAKE 1.5 TABLETS (150 MG TOTAL) BY MOUTH DAILY 06/17/22   Sherlene Shams, MD  traMADol (ULTRAM) 50 MG tablet TAKE 2 TABLETS (100 MG TOTAL) BY MOUTH EVERY 8 (EIGHT) HOURS AS NEEDED. FOR PAIN 11/28/21   Sherlene Shams, MD  traZODone (DESYREL) 100 MG tablet TAKE 1 TABLET BY MOUTH TWICE A DAY 04/02/22   Sherlene Shams, MD    Physical Exam: There were no vitals taken for this visit.  General: 77 y.o. year-old female well developed well nourished in no acute distress.  Hyper somnolent but arouses to voices.   Cardiovascular: Regular rate and rhythm with no rubs or gallops.  No thyromegaly or JVD noted.  Trace lower extremity edema. 2/4 pulses in all 4 extremities. Respiratory: Clear to auscultation with no wheezes or rales.  Poor inspiratory effort. Abdomen: Soft nontender nondistended with normal bowel sounds x4 quadrants. Muskuloskeletal: No cyanosis or clubbing noted bilaterally Neuro: CN II-XII intact, strength, sensation, reflexes Skin: No ulcerative lesions noted or rashes Psychiatry: Unable to assess judgment and mood due to hypersomnolence.         Labs on Admission:  Basic Metabolic Panel: Recent Labs  Lab 07/17/22 2112  NA 140  K 3.4*  CL 107  CO2 24   GLUCOSE 132*  BUN 23  CREATININE 1.16*  CALCIUM 8.5*   Liver Function Tests: No results for input(s): "AST", "ALT", "ALKPHOS", "BILITOT", "PROT", "ALBUMIN" in the last 168 hours. No results for input(s): "LIPASE", "AMYLASE" in the last 168 hours. No results for input(s): "AMMONIA" in the last 168 hours. CBC: Recent Labs  Lab 07/17/22 2112  WBC 10.6*  NEUTROABS 8.8*  HGB 11.5*  HCT 35.3*  MCV 92.4  PLT 228   Cardiac Enzymes: No results for input(s): "CKTOTAL", "CKMB", "CKMBINDEX", "TROPONINI" in the last 168 hours.  BNP (last 3 results) No results for input(s): "BNP" in the last 8760 hours.  ProBNP (last 3 results) No results for input(s): "PROBNP" in  the last 8760 hours.  CBG: No results for input(s): "GLUCAP" in the last 168 hours.  Radiological Exams on Admission: DG Chest Portable 1 View  Result Date: 07/17/2022 CLINICAL DATA:  Femur fracture after fall.  Preop. EXAM: PORTABLE CHEST 1 VIEW COMPARISON:  None Available. FINDINGS: The heart is normal in size for technique.The cardiomediastinal contours are normal. The lungs are clear. Pulmonary vasculature is normal. No consolidation, pleural effusion, or pneumothorax. No acute osseous abnormalities are seen. IMPRESSION: No acute chest findings. Electronically Signed   By: Narda Rutherford M.D.   On: 07/17/2022 21:11   DG Hip Unilat W or Wo Pelvis 2-3 Views Right  Result Date: 07/17/2022 CLINICAL DATA:  Right-sided hip and knee pain after fall. EXAM: DG HIP (WITH OR WITHOUT PELVIS) 2-3V RIGHT COMPARISON:  None Available. FINDINGS: Distal femur fracture is partially included in the field of view. No fracture of the proximal femur or hip. No pelvic fracture. Previous left hip arthroplasty. Bladder tack. Pre sacral stimulator. The pubic rami are intact. Pubic symphysis and sacroiliac joints are congruent. IMPRESSION: No fracture of the proximal femur, hip or pelvis. Distal femur fracture is partially included in the field of  view. Electronically Signed   By: Narda Rutherford M.D.   On: 07/17/2022 21:07   DG Knee Complete 4 Views Right  Result Date: 07/17/2022 CLINICAL DATA:  Fall EXAM: RIGHT KNEE - COMPLETE 4+ VIEW COMPARISON:  None Available. FINDINGS: Right knee total arthroplasty is present. There is an acute comminuted fracture of the distal femur extending to the level of the arthroplasty. There is 1 shaft with medial and posterior displacement of the distal fracture fragment with apex lateral angulation. There is surrounding soft tissue swelling. There is no dislocation. IMPRESSION: Acute comminuted fracture of the distal femur extending to the level of the arthroplasty. Electronically Signed   By: Darliss Cheney M.D.   On: 07/17/2022 21:06    EKG: I independently viewed the EKG done and my findings are as followed: None available at the time of this visit.  Assessment/Plan Present on Admission:  Femur fracture, right (HCC)  Principal Problem:   Femur fracture, right (HCC)  Acute comminuted fracture of the distal femur extending to the level of the arthroplasty, POA Orthopedic surgery Dr. Victorino Dike, recommended transfer from [facility] to [facility] for surgical evaluation. N.p.o. until seen by orthopedic surgery Denies having any pain at this time, hypersomnolent likely from narcotics.  Will hold off on narcotic or sedating agents for now to avoid complications.  Acute metabolic encephalopathy, hypersomnolence, suspect side effect from narcotics Will obtain VBG to assess for pH and pCO2 levels Hold off on sedative agents for now CPAP nightly Reorient as needed  Type 2 diabetes with hyperglycemia Serum glucose 251 on presentation Obtain hemoglobin A1c Start insulin sliding scale every 4 hours while NPO Gentle IV fluid hydration NS at 75 cc/h x 1 day.  CKD 3B Appears to be at her baseline renal function Avoid nephrotoxic agents, dehydration and hypotension Gentle IV fluid hydration NS at 75 cc/h x 1 day Monitor  urine output  Obesity BMI 36 Recommend weight loss outpatient with regular physical activity and healthy dieting.   Time: 75 minutes.    DVT prophylaxis: SCDs, due to possible orthopedic surgery.  Defer to orthopedic surgery to start pharmacological DVT prophylaxis.  Code Status: Full code  Family Communication: None at bedside  Disposition Plan: Admitted to telemetry surgical unit  Consults called: Orthopedic surgery consulted by EDP  Admission status: Inpatient  status.   Status is: Inpatient The patient requires at least 2 midnights for further evaluation and treatment of present condition.   Darlin Drop MD [facility] Pager [phone]  If 7PM-7AM, please contact night-coverage [email or web address] Password [facility]  07/18/2022, 3:24 AM

## 2022-07-19 ENCOUNTER — Other Ambulatory Visit: Payer: Self-pay

## 2022-07-19 ENCOUNTER — Inpatient Hospital Stay (HOSPITAL_COMMUNITY): Payer: Medicare HMO

## 2022-07-19 ENCOUNTER — Inpatient Hospital Stay (HOSPITAL_COMMUNITY): Payer: Medicare HMO | Admitting: Anesthesiology

## 2022-07-19 ENCOUNTER — Encounter (HOSPITAL_COMMUNITY): Payer: Self-pay | Admitting: Internal Medicine

## 2022-07-19 ENCOUNTER — Encounter (HOSPITAL_COMMUNITY)
Disposition: A | Discharge status: Skilled Nursing Facility | Payer: Self-pay | Source: Other Acute Inpatient Hospital | Attending: Internal Medicine

## 2022-07-19 DIAGNOSIS — S72451A Displaced supracondylar fracture without intracondylar extension of lower end of right femur, initial encounter for closed fracture: Secondary | ICD-10-CM

## 2022-07-19 DIAGNOSIS — Z8639 Personal history of other endocrine, nutritional and metabolic disease: Secondary | ICD-10-CM

## 2022-07-19 DIAGNOSIS — S7291XK Unspecified fracture of right femur, subsequent encounter for closed fracture with nonunion: Secondary | ICD-10-CM | POA: Diagnosis not present

## 2022-07-19 DIAGNOSIS — Z96642 Presence of left artificial hip joint: Secondary | ICD-10-CM

## 2022-07-19 DIAGNOSIS — E6609 Other obesity due to excess calories: Secondary | ICD-10-CM

## 2022-07-19 DIAGNOSIS — I129 Hypertensive chronic kidney disease with stage 1 through stage 4 chronic kidney disease, or unspecified chronic kidney disease: Secondary | ICD-10-CM

## 2022-07-19 DIAGNOSIS — N1831 Chronic kidney disease, stage 3a: Secondary | ICD-10-CM

## 2022-07-19 DIAGNOSIS — Z9884 Bariatric surgery status: Secondary | ICD-10-CM

## 2022-07-19 DIAGNOSIS — G4733 Obstructive sleep apnea (adult) (pediatric): Secondary | ICD-10-CM

## 2022-07-19 DIAGNOSIS — N183 Chronic kidney disease, stage 3 unspecified: Secondary | ICD-10-CM

## 2022-07-19 DIAGNOSIS — Z6833 Body mass index (BMI) 33.0-33.9, adult: Secondary | ICD-10-CM

## 2022-07-19 HISTORY — PX: ORIF FEMUR FRACTURE: SHX2119

## 2022-07-19 LAB — CBC
HCT: 28.1 % — ABNORMAL LOW (ref 36.0–46.0)
HCT: 26.9 % — ABNORMAL LOW (ref 36.0–46.0)
Hemoglobin: 9.2 g/dL — ABNORMAL LOW (ref 12.0–15.0)
Hemoglobin: 9 g/dL — ABNORMAL LOW (ref 12.0–15.0)
MCH: 30.4 pg (ref 26.0–34.0)
MCH: 30.9 pg (ref 26.0–34.0)
MCHC: 32.7 g/dL (ref 30.0–36.0)
MCHC: 33.5 g/dL (ref 30.0–36.0)
MCV: 92.7 fL (ref 80.0–100.0)
MCV: 92.4 fL (ref 80.0–100.0)
Platelets: 173 10*3/uL (ref 150–400)
Platelets: 175 10*3/uL (ref 150–400)
RBC: 3.03 MIL/uL — ABNORMAL LOW (ref 3.87–5.11)
RBC: 2.91 MIL/uL — ABNORMAL LOW (ref 3.87–5.11)
RDW: 13.1 % (ref 11.5–15.5)
RDW: 13.1 % (ref 11.5–15.5)
WBC: 7.8 10*3/uL (ref 4.0–10.5)
WBC: 8.9 10*3/uL (ref 4.0–10.5)
nRBC: 0 % (ref 0.0–0.2)
nRBC: 0 % (ref 0.0–0.2)

## 2022-07-19 LAB — GLUCOSE, CAPILLARY
Glucose-Capillary: 123 mg/dL — ABNORMAL HIGH (ref 70–99)
Glucose-Capillary: 141 mg/dL — ABNORMAL HIGH (ref 70–99)
Glucose-Capillary: 146 mg/dL — ABNORMAL HIGH (ref 70–99)
Glucose-Capillary: 244 mg/dL — ABNORMAL HIGH (ref 70–99)
Glucose-Capillary: 298 mg/dL — ABNORMAL HIGH (ref 70–99)
Glucose-Capillary: 303 mg/dL — ABNORMAL HIGH (ref 70–99)

## 2022-07-19 LAB — BASIC METABOLIC PANEL
Anion gap: 5 (ref 5–15)
BUN: 21 mg/dL (ref 8–23)
CO2: 27 mmol/L (ref 22–32)
Calcium: 8.3 mg/dL — ABNORMAL LOW (ref 8.9–10.3)
Chloride: 106 mmol/L (ref 98–111)
Creatinine, Ser: 1.19 mg/dL — ABNORMAL HIGH (ref 0.44–1.00)
GFR, Estimated: 48 mL/min — ABNORMAL LOW (ref >60)
Glucose, Bld: 80 mg/dL (ref 70–99)
Potassium: 3.7 mmol/L (ref 3.5–5.1)
Sodium: 138 mmol/L (ref 135–145)

## 2022-07-19 LAB — CREATININE, SERUM
Creatinine, Ser: 1.14 mg/dL — ABNORMAL HIGH (ref 0.44–1.00)
GFR, Estimated: 50 mL/min — ABNORMAL LOW (ref >60)

## 2022-07-19 LAB — HEMOGLOBIN A1C
Hgb A1c MFr Bld: 5.3 % (ref 4.8–5.6)
Mean Plasma Glucose: 105 mg/dL

## 2022-07-19 LAB — MAGNESIUM: Magnesium: 1.8 mg/dL (ref 1.7–2.4)

## 2022-07-19 LAB — VITAMIN D 25 HYDROXY (VIT D DEFICIENCY, FRACTURES): Vit D, 25-Hydroxy: 39.82 ng/mL (ref 30–100)

## 2022-07-19 SURGERY — OPEN REDUCTION INTERNAL FIXATION (ORIF) DISTAL FEMUR FRACTURE
Anesthesia: General | Laterality: Right

## 2022-07-19 MED ORDER — LACTATED RINGERS IV SOLN: INTRAVENOUS | Status: DC

## 2022-07-19 MED ORDER — TRANEXAMIC ACID-NACL 1000-0.7 MG/100ML-% IV SOLN
1000.0000 mg | Freq: Once | INTRAVENOUS | Status: AC
  Administered 2022-07-19: 1000 mg via INTRAVENOUS
  Filled 2022-07-19: qty 100

## 2022-07-19 MED ORDER — VANCOMYCIN HCL 1000 MG IV SOLR
INTRAVENOUS | Status: AC
  Filled 2022-07-19: qty 20

## 2022-07-19 MED ORDER — ONDANSETRON HCL 4 MG/2ML IJ SOLN: 4.0000 mg | Freq: Once | INTRAMUSCULAR | Status: DC | PRN

## 2022-07-19 MED ORDER — VASOPRESSIN 20 UNIT/ML IV SOLN
INTRAVENOUS | Status: AC
  Filled 2022-07-19: qty 1

## 2022-07-19 MED ORDER — HYDROMORPHONE HCL 1 MG/ML IJ SOLN
0.5000 mg | INTRAMUSCULAR | Status: DC | PRN
  Administered 2022-07-19: 0.5 mg via INTRAVENOUS
  Filled 2022-07-19: qty 0.5

## 2022-07-19 MED ORDER — CHLORHEXIDINE GLUCONATE 0.12 % MT SOLN
OROMUCOSAL | Status: AC
  Administered 2022-07-19: 15 mL via OROMUCOSAL
  Filled 2022-07-19: qty 15

## 2022-07-19 MED ORDER — ROCURONIUM BROMIDE 10 MG/ML (PF) SYRINGE
PREFILLED_SYRINGE | INTRAVENOUS | Status: DC | PRN
  Administered 2022-07-19: 60 mg via INTRAVENOUS

## 2022-07-19 MED ORDER — PROPOFOL 10 MG/ML IV BOLUS
INTRAVENOUS | Status: AC
  Filled 2022-07-19: qty 20

## 2022-07-19 MED ORDER — MIDAZOLAM HCL 2 MG/2ML IJ SOLN
INTRAMUSCULAR | Status: AC
  Filled 2022-07-19: qty 2

## 2022-07-19 MED ORDER — SUGAMMADEX SODIUM 200 MG/2ML IV SOLN
INTRAVENOUS | Status: DC | PRN
  Administered 2022-07-19: 200 mg via INTRAVENOUS

## 2022-07-19 MED ORDER — MIDAZOLAM HCL 2 MG/2ML IJ SOLN
INTRAMUSCULAR | Status: DC | PRN
  Administered 2022-07-19: 2 mg via INTRAVENOUS

## 2022-07-19 MED ORDER — ONDANSETRON HCL 4 MG/2ML IJ SOLN
INTRAMUSCULAR | Status: DC | PRN
  Administered 2022-07-19: 4 mg via INTRAVENOUS

## 2022-07-19 MED ORDER — OXYCODONE-ACETAMINOPHEN 5-325 MG PO TABS
ORAL_TABLET | ORAL | Status: AC
  Administered 2022-07-19: 1
  Filled 2022-07-19: qty 1

## 2022-07-19 MED ORDER — 0.9 % SODIUM CHLORIDE (POUR BTL) OPTIME
TOPICAL | Status: DC | PRN
  Administered 2022-07-19: 1000 mL

## 2022-07-19 MED ORDER — ORAL CARE MOUTH RINSE: 15.0000 mL | Freq: Once | OROMUCOSAL | Status: AC

## 2022-07-19 MED ORDER — OXYCODONE-ACETAMINOPHEN 5-325 MG PO TABS: 1.0000 | ORAL_TABLET | ORAL | 0 refills | Status: DC | PRN

## 2022-07-19 MED ORDER — CEFAZOLIN SODIUM-DEXTROSE 2-4 GM/100ML-% IV SOLN
2.0000 g | Freq: Three times a day (TID) | INTRAVENOUS | Status: AC
  Administered 2022-07-19 – 2022-07-20 (×3): 2 g via INTRAVENOUS
  Filled 2022-07-19 (×3): qty 100

## 2022-07-19 MED ORDER — ACETAMINOPHEN 500 MG PO TABS
ORAL_TABLET | ORAL | Status: AC
  Administered 2022-07-19: 1000 mg via ORAL
  Filled 2022-07-19: qty 2

## 2022-07-19 MED ORDER — CHLORHEXIDINE GLUCONATE 0.12 % MT SOLN: 15.0000 mL | Freq: Once | OROMUCOSAL | Status: AC

## 2022-07-19 MED ORDER — FENTANYL CITRATE (PF) 250 MCG/5ML IJ SOLN
INTRAMUSCULAR | Status: DC | PRN
  Administered 2022-07-19: 50 ug via INTRAVENOUS
  Administered 2022-07-19 (×2): 100 ug via INTRAVENOUS

## 2022-07-19 MED ORDER — FENTANYL CITRATE (PF) 100 MCG/2ML IJ SOLN
25.0000 ug | INTRAMUSCULAR | Status: DC | PRN
  Administered 2022-07-19: 50 ug via INTRAVENOUS
  Administered 2022-07-19 (×2): 25 ug via INTRAVENOUS

## 2022-07-19 MED ORDER — DOCUSATE SODIUM 100 MG PO CAPS
100.0000 mg | ORAL_CAPSULE | Freq: Two times a day (BID) | ORAL | Status: DC
  Administered 2022-07-19 – 2022-07-23 (×8): 100 mg via ORAL
  Filled 2022-07-19 (×8): qty 1

## 2022-07-19 MED ORDER — DEXAMETHASONE SODIUM PHOSPHATE 10 MG/ML IJ SOLN
INTRAMUSCULAR | Status: DC | PRN
  Administered 2022-07-19: 10 mg via INTRAVENOUS

## 2022-07-19 MED ORDER — ENOXAPARIN SODIUM 40 MG/0.4ML IJ SOSY
40.0000 mg | PREFILLED_SYRINGE | INTRAMUSCULAR | Status: DC
  Administered 2022-07-20: 40 mg via SUBCUTANEOUS
  Filled 2022-07-19: qty 0.4

## 2022-07-19 MED ORDER — ONDANSETRON HCL 4 MG/2ML IJ SOLN
INTRAMUSCULAR | Status: AC
  Filled 2022-07-19: qty 2

## 2022-07-19 MED ORDER — VANCOMYCIN HCL 1000 MG IV SOLR
INTRAVENOUS | Status: DC | PRN
  Administered 2022-07-19: 1000 mg

## 2022-07-19 MED ORDER — FENTANYL CITRATE (PF) 250 MCG/5ML IJ SOLN
INTRAMUSCULAR | Status: AC
  Filled 2022-07-19: qty 5

## 2022-07-19 MED ORDER — ACETAMINOPHEN 500 MG PO TABS: 1000.0000 mg | ORAL_TABLET | Freq: Once | ORAL | Status: AC

## 2022-07-19 MED ORDER — LIDOCAINE 2% (20 MG/ML) 5 ML SYRINGE
INTRAMUSCULAR | Status: DC | PRN
  Administered 2022-07-19: 80 mg via INTRAVENOUS

## 2022-07-19 MED ORDER — KETAMINE HCL 10 MG/ML IJ SOLN
INTRAMUSCULAR | Status: DC | PRN
  Administered 2022-07-19: 10 mg via INTRAVENOUS
  Administered 2022-07-19: 30 mg via INTRAVENOUS

## 2022-07-19 MED ORDER — PROPOFOL 10 MG/ML IV BOLUS
INTRAVENOUS | Status: DC | PRN
  Administered 2022-07-19: 120 mg via INTRAVENOUS

## 2022-07-19 MED ORDER — KETAMINE HCL 50 MG/5ML IJ SOSY
PREFILLED_SYRINGE | INTRAMUSCULAR | Status: AC
  Filled 2022-07-19: qty 5

## 2022-07-19 MED ORDER — FENTANYL CITRATE (PF) 100 MCG/2ML IJ SOLN
INTRAMUSCULAR | Status: AC
  Filled 2022-07-19: qty 2

## 2022-07-19 MED ORDER — GABAPENTIN 100 MG PO CAPS
100.0000 mg | ORAL_CAPSULE | Freq: Three times a day (TID) | ORAL | Status: DC
  Administered 2022-07-19 – 2022-07-23 (×12): 100 mg via ORAL
  Filled 2022-07-19 (×12): qty 1

## 2022-07-19 MED ORDER — DEXAMETHASONE SODIUM PHOSPHATE 10 MG/ML IJ SOLN
INTRAMUSCULAR | Status: AC
  Filled 2022-07-19: qty 1

## 2022-07-19 MED ORDER — APIXABAN 2.5 MG PO TABS: 2.5000 mg | ORAL_TABLET | Freq: Two times a day (BID) | ORAL | 0 refills | Status: DC

## 2022-07-19 SURGICAL SUPPLY — 77 items
ADH SKN CLS APL DERMABOND .7 (GAUZE/BANDAGES/DRESSINGS) ×2
APL PRP STRL LF DISP 70% ISPRP (MISCELLANEOUS)
BAG COUNTER SPONGE SURGICOUNT (BAG) ×1 IMPLANT
BAG SPNG CNTER NS LX DISP (BAG) ×1
BIT DRILL 4.3 (BIT) ×1 IMPLANT
BIT DRILL 4.3X300MM (BIT) IMPLANT
BIT DRILL LONG 3.3 (BIT) IMPLANT
BIT DRILL QC 3.3X195 (BIT) IMPLANT
BLADE CLIPPER SURG (BLADE) IMPLANT
BNDG CMPR 5X6 CHSV STRCH STRL (GAUZE/BANDAGES/DRESSINGS)
BNDG CMPR MED 10X6 ELC LF (GAUZE/BANDAGES/DRESSINGS) ×1
BNDG COHESIVE 6X5 TAN ST LF (GAUZE/BANDAGES/DRESSINGS) ×1 IMPLANT
BNDG ELASTIC 6X10 VLCR STRL LF (GAUZE/BANDAGES/DRESSINGS) ×1 IMPLANT
BRUSH SCRUB EZ PLAIN DRY (MISCELLANEOUS) ×2 IMPLANT
CANISTER SUCT 3000ML PPV (MISCELLANEOUS) ×1 IMPLANT
CAP LOCK NCB (Cap) IMPLANT
CHLORAPREP W/TINT 26 (MISCELLANEOUS) ×1 IMPLANT
COVER SURGICAL LIGHT HANDLE (MISCELLANEOUS) ×1 IMPLANT
DERMABOND ADVANCED .7 DNX12 (GAUZE/BANDAGES/DRESSINGS) IMPLANT
DRAPE C-ARM 42X72 X-RAY (DRAPES) ×1 IMPLANT
DRAPE C-ARMOR (DRAPES) ×1 IMPLANT
DRAPE HALF SHEET 40X57 (DRAPES) ×2 IMPLANT
DRAPE ORTHO SPLIT 77X108 STRL (DRAPES) ×2
DRAPE SURG 17X23 STRL (DRAPES) ×1 IMPLANT
DRAPE SURG ORHT 6 SPLT 77X108 (DRAPES) ×2 IMPLANT
DRAPE U-SHAPE 47X51 STRL (DRAPES) ×1 IMPLANT
DRESSING MEPILEX FLEX 4X4 (GAUZE/BANDAGES/DRESSINGS) IMPLANT
DRILL BIT 4.3 (BIT) ×1
DRSG ADAPTIC 3X8 NADH LF (GAUZE/BANDAGES/DRESSINGS) IMPLANT
DRSG MEPILEX FLEX 4X4 (GAUZE/BANDAGES/DRESSINGS) ×1
DRSG MEPILEX POST OP 4X12 (GAUZE/BANDAGES/DRESSINGS) IMPLANT
DRSG MEPILEX POST OP 4X8 (GAUZE/BANDAGES/DRESSINGS) IMPLANT
ELECT REM PT RETURN 9FT ADLT (ELECTROSURGICAL) ×1
ELECTRODE REM PT RTRN 9FT ADLT (ELECTROSURGICAL) ×1 IMPLANT
GAUZE PAD ABD 8X10 STRL (GAUZE/BANDAGES/DRESSINGS) ×3 IMPLANT
GAUZE SPONGE 4X4 12PLY STRL (GAUZE/BANDAGES/DRESSINGS) ×1 IMPLANT
GLOVE BIO SURGEON STRL SZ 6.5 (GLOVE) ×3 IMPLANT
GLOVE BIO SURGEON STRL SZ7.5 (GLOVE) ×4 IMPLANT
GLOVE BIOGEL PI IND STRL 6.5 (GLOVE) ×1 IMPLANT
GLOVE BIOGEL PI IND STRL 7.5 (GLOVE) ×1 IMPLANT
GOWN STRL REUS W/ TWL LRG LVL3 (GOWN DISPOSABLE) ×3 IMPLANT
GOWN STRL REUS W/TWL LRG LVL3 (GOWN DISPOSABLE) ×3
K-WIRE 2.0 (WIRE) ×1
K-WIRE FXSTD 280X2XNS SS (WIRE) ×1
KIT BASIN OR (CUSTOM PROCEDURE TRAY) ×1 IMPLANT
KIT TURNOVER KIT B (KITS) ×1 IMPLANT
KWIRE FXSTD 280X2XNS SS (WIRE) IMPLANT
NS IRRIG 1000ML POUR BTL (IV SOLUTION) ×1 IMPLANT
PACK TOTAL JOINT (CUSTOM PROCEDURE TRAY) ×1 IMPLANT
PAD ABD 8X10 STRL (GAUZE/BANDAGES/DRESSINGS) IMPLANT
PAD ARMBOARD 7.5X6 YLW CONV (MISCELLANEOUS) ×2 IMPLANT
PAD CAST 4YDX4 CTTN HI CHSV (CAST SUPPLIES) ×1 IMPLANT
PADDING CAST COTTON 4X4 STRL (CAST SUPPLIES) ×1
PADDING CAST COTTON 6X4 STRL (CAST SUPPLIES) ×1 IMPLANT
PLATE DISTAL FEMUR 15H 317M RT (Plate) IMPLANT
SCREW 5.0 80MM (Screw) IMPLANT
SCREW NCB 3.5X75X5X6.2XST (Screw) IMPLANT
SCREW NCB 4.0MX34M (Screw) IMPLANT
SCREW NCB 4.0MX42M (Screw) IMPLANT
SCREW NCB 4.0X40MM (Screw) IMPLANT
SCREW NCB 5.0X38 (Screw) IMPLANT
SCREW NCB 5.0X75MM (Screw) ×1 IMPLANT
SCREW NCB 5.0X85MM (Screw) IMPLANT
SPONGE T-LAP 18X18 ~~LOC~~+RFID (SPONGE) IMPLANT
STAPLER VISISTAT 35W (STAPLE) ×1 IMPLANT
SUCTION TUBE FRAZIER 10FR DISP (SUCTIONS) ×1 IMPLANT
SUT ETHILON 3 0 PS 1 (SUTURE) ×2 IMPLANT
SUT MON AB 2-0 CT1 36 (SUTURE) IMPLANT
SUT VIC AB 0 CT1 27 (SUTURE) ×1
SUT VIC AB 0 CT1 27XBRD ANBCTR (SUTURE) IMPLANT
SUT VIC AB 1 CT1 27 (SUTURE) ×1
SUT VIC AB 1 CT1 27XBRD ANBCTR (SUTURE) IMPLANT
SUT VIC AB 2-0 CT1 27 (SUTURE) ×1
SUT VIC AB 2-0 CT1 TAPERPNT 27 (SUTURE) ×2 IMPLANT
TOWEL GREEN STERILE (TOWEL DISPOSABLE) ×2 IMPLANT
TRAY FOLEY MTR SLVR 16FR STAT (SET/KITS/TRAYS/PACK) IMPLANT
WATER STERILE IRR 1000ML POUR (IV SOLUTION) ×2 IMPLANT

## 2022-07-19 NOTE — Anesthesia Procedure Notes (Signed)
Procedure Name: Intubation Date/Time: 07/19/2022 10:22 AM  Performed by: Rosiland Oz, CRNAPre-anesthesia Checklist: Patient identified, Emergency Drugs available, Suction available, Patient being monitored and Timeout performed Patient Re-evaluated:Patient Re-evaluated prior to induction Oxygen Delivery Method: Circle system utilized Preoxygenation: Pre-oxygenation with 100% oxygen Induction Type: IV induction Ventilation: Mask ventilation without difficulty Laryngoscope Size: Mac and 3 Grade View: Grade I Tube type: Oral Tube size: 7.0 mm Number of attempts: 1 Airway Equipment and Method: Stylet Placement Confirmation: ETT inserted through vocal cords under direct vision, positive ETCO2 and breath sounds checked- equal and bilateral Secured at: 21 cm Tube secured with: Tape Dental Injury: Teeth and Oropharynx as per pre-operative assessment  Comments: Airway Per Ronna Polio Paramedic student

## 2022-07-19 NOTE — Op Note (Signed)
Orthopaedic Surgery Operative Note (CSN: [id number] ) Date of Surgery: 07/19/2022  Admit Date: 07/18/2022   Diagnoses: Pre-Op Diagnoses: Right supracondylar distal femur fracture with femoral shaft extension  Post-Op Diagnosis: Same  Procedures: CPT 27511-Open reduction internal fixation of right supracondylar distal femur fracture  Surgeons : Primary: Roby Lofts, MD  Assistant: Ulyses Southward, PA-C  Location: OR 3   Anesthesia: General   Antibiotics: Ancef 2g preop with 1 gm vancomycin powder placed topically   Tourniquet time: None    Estimated Blood Loss: 100 mL  Complications: None   Specimens:None   Implants: Implant Name Type Inv. Item Serial No. Manufacturer Lot No. LRB No. Used Action  PLATE DISTAL FEMUR 15H 317M RT - [id number] Plate PLATE DISTAL FEMUR 15H 317M RT  [employer](ORTH,TRAU,BIO,SG)  Right 1 Implanted  CAP LOCK NCB - [id number] Cap CAP LOCK NCB  [employer](ORTH,TRAU,BIO,SG)  Right 7 Implanted  SCREW 5.0 - [id number] Screw SCREW 5.0  [employer](ORTH,TRAU,BIO,SG)  Right 2 Implanted  SCREW NCB 4.0X40MM - [id number] Screw SCREW NCB 4.0X40MM  [employer](ORTH,TRAU,BIO,SG)  Right 1 Implanted  SCREW NCB 4.0MX34M - [id number] Screw SCREW NCB 4.0MX34M  [employer](ORTH,TRAU,BIO,SG)  Right 1 Implanted  SCREW NCB 5.0X38 - [id number] Screw SCREW NCB 5.0X38  [employer](ORTH,TRAU,BIO,SG)  Right 2 Implanted  SCREW NCB 5.0X85MM - [id number] Screw SCREW NCB 5.0X85MM  [employer](ORTH,TRAU,BIO,SG)  Right 4 Implanted     Indications for Surgery: 77 year old female who sustained a ground-level fall with a right supracondylar distal femur fracture with femoral shaft extension.  Due to the unstable nature of her injury I recommend proceeding with open reduction internal fixation.  Risks and benefits were discussed with the patient.  Risks include but not limited to bleeding, infection, malunion, nonunion, hardware failure, hardware irritation, nerve or  blood vessel injury, DVT, knee stiffness, even the possibility anesthetic complications.  She agreed to proceed with surgery and consent was obtained.  Operative Findings: 1.  Open reduction internal fixation of right supracondylar distal femur fracture and open reduction internal fixation of right femoral shaft fracture using a [employer] NCB 15 hole distal femoral locking plate.  Procedure: The patient was identified in the preoperative holding area. Consent was confirmed with the patient and their family and all questions were answered. The operative extremity was marked after confirmation with the patient. she was then brought back to the operating room by our anesthesia colleagues.  She was placed under general anesthetic and carefully transferred over to radiolucent flattop table.  A bump was placed under her operative hip.  The right lower extremity was then prepped and draped in usual sterile fashion.  A timeout was performed to verify the patient, the procedure, and the extremity.  Preoperative antibiotics were dosed.  The hip and knee were flexed over a triangle.  Fluoroscopic imaging showed the unstable nature of his injury.  Traction was applied to align the fracture.  The triangle was suggested to align the sagittal view.  A lateral approach to the distal femur was carried down through skin and subcutaneous tissue.  I incised through the IT band and visualized the lateral cortex of the distal femur.  I then chose a 15 hole [employer] NCB distal femoral locking plate and attach it to a targeting arm.  I then slid it submuscularly along the lateral cortex of the femur.  I aligned the distal portion of the plate and placed a 2.0 mm K wire to hold it in position.  I  then percutaneously placed a 3.3 mm drill bit proximally and held the proximal portion of the plate.  I then returned to the distal segment and proceeded to place a to 5.0 millimeter screws to bring the plate flush to bone.  I then  percutaneously placed a mixture of 5.0 millimeter screws and 4.0 millimeter screws in the femoral shaft.  Locking caps were placed on the most distal femoral shaft screws.  I returned to the distal segment and proceeded to place 3 more 5.0 millimeter screws distally.  A total of 5 screws were placed distally.  Locking caps were placed on all distal screws.  The targeting arm was removed and final fluoroscopic imaging was obtained.  The incisions were copiously irrigated.  A gram of vancomycin powder was placed into the incision.  A layered closure of 0 Vicryl, 2-0 Monocryl and 3-0 Monocryl with Dermabond was used to close the skin.  Sterile dressing was applied.  The patient was then awoken from anesthesia and taken to the PACU in stable condition.  Post Op Plan/Instructions: Patient will be touchdown weightbearing to the right lower extremity.  She will receive postoperative Ancef.  She will receive Lovenox for DVT prophylaxis and discharged on an oral DOAC.  Will have her mobilize with physical and Occupational Therapy.  She is  I was present and performed the entire surgery.  Ulyses Southward, PA-C did assist me throughout the case. An assistant was necessary given the difficulty in approach, maintenance of reduction and ability to instrument the fracture.   Truitt Merle, MD [facility]

## 2022-07-19 NOTE — Discharge Instructions (Addendum)
Orthopaedic Trauma Service Discharge Instructions   General Discharge Instructions  WEIGHT BEARING STATUS:Touchdown weightbearing right lower extremity  RANGE OF MOTION/ACTIVITY: Ok for unrestricted knee range of motion  Wound Care: You may remove your surgical dressing on post-op day #2, (Sunday 07/21/22). Incisions can be left open to air if there is no drainage. Once the incision is completely dry and without drainage, it may be left open to air out.  Showering may begin as early as post op day #3, (Monday 07/22/22).  Clean incision gently with soap and water.  DVT/PE prophylaxis:  Eliquis 2.5 mg twice daily x 30 days  Diet: as you were eating previously.  Can use over the counter stool softeners and bowel preparations, such as Miralax, to help with bowel movements.  Narcotics can be constipating.  Be sure to drink plenty of fluids  PAIN MEDICATION USE AND EXPECTATIONS  You have likely been given narcotic medications to help control your pain.  After a traumatic event that results in an fracture (broken bone) with or without surgery, it is ok to use narcotic pain medications to help control one's pain.  We understand that everyone responds to pain differently and each individual patient will be evaluated on a regular basis for the continued need for narcotic medications. Ideally, narcotic medication use should last no more than 6-8 weeks (coinciding with fracture healing).   As a patient it is your responsibility as well to monitor narcotic medication use and report the amount and frequency you use these medications when you come to your office visit.   We would also advise that if you are using narcotic medications, you should take a dose prior to therapy to maximize you participation.  IF YOU ARE ON NARCOTIC MEDICATIONS IT IS NOT PERMISSIBLE TO OPERATE A MOTOR VEHICLE (MOTORCYCLE/CAR/TRUCK/MOPED) OR HEAVY MACHINERY DO NOT MIX NARCOTICS WITH OTHER CNS (CENTRAL NERVOUS SYSTEM) DEPRESSANTS  SUCH AS ALCOHOL   STOP SMOKING OR USING NICOTINE PRODUCTS!!!!  As discussed nicotine severely impairs your body's ability to heal surgical and traumatic wounds but also impairs bone healing.  Wounds and bone heal by forming microscopic blood vessels (angiogenesis) and nicotine is a vasoconstrictor (essentially, shrinks blood vessels).  Therefore, if vasoconstriction occurs to these microscopic blood vessels they essentially disappear and are unable to deliver necessary nutrients to the healing tissue.  This is one modifiable factor that you can do to dramatically increase your chances of healing your injury.    (This means no smoking, no nicotine gum, patches, etc)  DO NOT USE NONSTEROIDAL ANTI-INFLAMMATORY DRUGS (NSAID'S)  Using products such as Advil (ibuprofen), Aleve (naproxen), Motrin (ibuprofen) for additional pain control during fracture healing can delay and/or prevent the healing response.  If you would like to take over the counter (OTC) medication, Tylenol (acetaminophen) is ok.  However, some narcotic medications that are given for pain control contain acetaminophen as well. Therefore, you should not exceed more than 4000 mg of tylenol in a day if you do not have liver disease.  Also note that there are may OTC medicines, such as cold medicines and allergy medicines that my contain tylenol as well.  If you have any questions about medications and/or interactions please ask your doctor/PA or your pharmacist.      ICE AND ELEVATE INJURED/OPERATIVE EXTREMITY  Using ice and elevating the injured extremity above your heart can help with swelling and pain control.  Icing in a pulsatile fashion, such as 20 minutes on and 20 minutes off, can  be followed.    Do not place ice directly on skin. Make sure there is a barrier between to skin and the ice pack.    Using frozen items such as frozen peas works well as the conform nicely to the are that needs to be iced.  USE AN ACE WRAP OR TED HOSE FOR  SWELLING CONTROL  In addition to icing and elevation, Ace wraps or TED hose are used to help limit and resolve swelling.  It is recommended to use Ace wraps or TED hose until you are informed to stop.    When using Ace Wraps start the wrapping distally (farthest away from the body) and wrap proximally (closer to the body)   Example: If you had surgery on your leg or thing and you do not have a splint on, start the ace wrap at the toes and work your way up to the thigh        If you had surgery on your upper extremity and do not have a splint on, start the ace wrap at your fingers and work your way up to the upper arm   CALL THE OFFICE WITH ANY QUESTIONS OR CONCERNS: [phone]   VISIT OUR WEBSITE FOR ADDITIONAL INFORMATION: [email or web address]   Discharge Wound Care Instructions  Do NOT apply any ointments, solutions or lotions to pin sites or surgical wounds.  These prevent needed drainage and even though solutions like hydrogen peroxide kill bacteria, they also damage cells lining the pin sites that help fight infection.  Applying lotions or ointments can keep the wounds moist and can cause them to breakdown and open up as well. This can increase the risk for infection. When in doubt call the office.  If any drainage is noted, use one layer of adaptic or Mepitel, then gauze, Kerlix, and an ace wrap. - These dressing supplies should be available at local medical supply stores [facility], [facility], etc) as well as Insurance claims handler ([facility], [facility], [place], etc)  Once the incision is completely dry and without drainage, it may be left open to air out.  Showering may begin 36-48 hours later.  Cleaning gently with soap and water.

## 2022-07-19 NOTE — Progress Notes (Addendum)
PROGRESS NOTE    Jodi BRUMETT  [name]:[id number] DOB: 1945/03/02 DOA: 07/18/2022 PCP: Sherlene Shams, MD    Brief Narrative:  Same day note  Jodi [name] is a 77 y.o. female with medical history significant for right total knee replacement, type 2 diabetes, hypertension, obesity, OSA on CPAP, CKD 3B, who had initially presented to [facility] ED with complaints of a fall and right knee pain.  She tripped while trying to put a leash on a dog and landed on her right knee.  In the ED, right knee x-ray revealed acute comminuted fracture of the distal femur extending to the level of the arthroplasty.  EDP discussed the case with orthopedic surgery at [facility] who felt patient would benefit from Ortho trauma evaluation.  The patient initially requested [facility] where she previously had her knee replacement, but they did not have any beds and could not accept her.  EDP also discussed the case with orthopedic surgery at [facility] Dr. Victorino Dike, who recommended transfer to [facility] and admission by the hospitalist service.  In the ED, patient was hypersomnolent likely from narcotics and was put on CPAP.  Patient was afebrile with stable vitals.  Patient had hyperglycemia with glucose of 251,  Creatinine of 129 and hemoglobin was 10.8.  Patient was then admitted to the hospital for further evaluation and treatment.   Assessment and Plan.  Acute comminuted fracture of the right distal femur extending to the level of the arthroplasty, Orthopedic surgery Dr. Victorino Dike, recommended transfer from [facility] to [facility] for surgical evaluation.    Seen by orthopedics a plan for ORIF tomorrow with Dr. Jena Gauss.  Further management as per orthopedics.   Acute metabolic encephalopathy, hypersomnolence, suspect side effect from narcotics Resolved now.  Was initially on CPAP currently on room air.     History of type 2 diabetes with hyperglycemia Continue IV hydration sliding scale insulin.  Hemoglobin A1c of 5.3 at this time.   Not on medications at home.  Gastric bypass.   CKD 3b Creatinine on presentation at 1.1.  Likely at baseline.  Continue hydration for now.   Obesity Body mass index is 36.44 kg/m. Would recommend outpatient follow-up for weight loss management.  History of obstructive sleep apnea.  On CPAP.  Advised compliance with this.   DVT prophylaxis: SCDs Start: 07/18/22 0319   Code Status:     Code Status: Full Code  Disposition: Uncertain will need PT OT evaluation after surgery. Status is: Inpatient Remains inpatient appropriate because: Need for open reduction internal fixation,   Family Communication: None at bedside   Consultants:  orthopedics  Procedures:  BiPAP placement  Antimicrobials:  Preop cefazolin  Anti-infectives (From admission, onward)    Start     Dose/Rate Route Frequency Ordered Stop   07/19/22 0600  ceFAZolin (ANCEF) IVPB 2g/100 mL premix        2 g 200 mL/hr over 30 Minutes Intravenous To Short Stay 07/18/22 2349 07/19/22 1033        Subjective: Today, patient was seen and examined at bedside.  Feels okay with breathing no chest pain shortness of breath or dyspnea.  Awaiting for surgical intervention.  Pain in the right lower thigh and knee.  Objective: Vitals:   07/18/22 1234 07/18/22 2008 07/19/22 0426 07/19/22 0842  BP: (!) 140/61 (!) 126/53 (!) 161/65 (!) 141/81  Pulse: 66 85 80 80  Resp: 18 18 18 18   Temp: 98 F (36.7 C) 99.5 F (37.5 C) 98.4  F (36.9 C) 98.4 F (36.9 C)  TempSrc:  Oral Oral Oral  SpO2:  95% 100% 98%  Weight:    97.5 kg  Height:    5\' 4"  (1.626 m)    Intake/Output Summary (Last 24 hours) at 07/19/2022 1035 Last data filed at 07/18/2022 1824 Gross per 24 hour  Intake 644.48 ml  Output 1 ml  Net 643.48 ml   Filed Weights   07/18/22 0423 07/19/22 0842  Weight: 96.3 kg 97.5 kg    Physical Examination: Body mass index is 36.9 kg/m.  General: Obese built, not in obvious distress HENT:   No scleral pallor or  icterus noted. Oral mucosa is moist.  Chest:  Clear breath sounds. No crackles or wheezes.  CVS: S1 &S2 heard. No murmur.  Regular rate and rhythm. Abdomen: Soft, nontender, nondistended.  Bowel sounds are heard.   Extremities: No cyanosis, clubbing or edema.  Peripheral pulses are palpable.  Right knee with previous scar, tenderness on palpation on the lower thigh. Psych: Alert, awake and oriented, normal mood CNS:  No cranial nerve deficits.   Skin: Warm and dry.  No rashes noted.  Data Reviewed:   CBC: Recent Labs  Lab 07/17/22 2112 07/18/22 0400 07/19/22 0249  WBC 10.6* 7.0 7.8  NEUTROABS 8.8*  --   --   HGB 11.5* 10.8* 9.2*  HCT 35.3* 33.5* 28.1*  MCV 92.4 91.5 92.7  PLT 228 206 173    Basic Metabolic Panel: Recent Labs  Lab 07/17/22 2112 07/18/22 0400 07/19/22 0249  NA 140 138 138  K 3.4* 4.0 3.7  CL 107 104 106  CO2 24 24 27   GLUCOSE 132* 251* 80  BUN 23 22 21   CREATININE 1.16* 1.29* 1.19*  CALCIUM 8.5* 8.5* 8.3*  MG  --  1.8 1.8  PHOS  --  4.4  --     Liver Function Tests: No results for input(s): "AST", "ALT", "ALKPHOS", "BILITOT", "PROT", "ALBUMIN" in the last 168 hours.   Radiology Studies: DG Chest Portable 1 View  Result Date: 07/17/2022 CLINICAL DATA:  Femur fracture after fall.  Preop. EXAM: PORTABLE CHEST 1 VIEW COMPARISON:  None Available. FINDINGS: The heart is normal in size for technique.The cardiomediastinal contours are normal. The lungs are clear. Pulmonary vasculature is normal. No consolidation, pleural effusion, or pneumothorax. No acute osseous abnormalities are seen. IMPRESSION: No acute chest findings. Electronically Signed   By: Narda Rutherford M.D.   On: 07/17/2022 21:11   DG Hip Unilat W or Wo Pelvis 2-3 Views Right  Result Date: 07/17/2022 CLINICAL DATA:  Right-sided hip and knee pain after fall. EXAM: DG HIP (WITH OR WITHOUT PELVIS) 2-3V RIGHT COMPARISON:  None Available. FINDINGS: Distal femur fracture is partially included in  the field of view. No fracture of the proximal femur or hip. No pelvic fracture. Previous left hip arthroplasty. Bladder tack. Pre sacral stimulator. The pubic rami are intact. Pubic symphysis and sacroiliac joints are congruent. IMPRESSION: No fracture of the proximal femur, hip or pelvis. Distal femur fracture is partially included in the field of view. Electronically Signed   By: Narda Rutherford M.D.   On: 07/17/2022 21:07   DG Knee Complete 4 Views Right  Result Date: 07/17/2022 CLINICAL DATA:  Fall EXAM: RIGHT KNEE - COMPLETE 4+ VIEW COMPARISON:  None Available. FINDINGS: Right knee total arthroplasty is present. There is an acute comminuted fracture of the distal femur extending to the level of the arthroplasty. There is 1 shaft with medial and  posterior displacement of the distal fracture fragment with apex lateral angulation. There is surrounding soft tissue swelling. There is no dislocation. IMPRESSION: Acute comminuted fracture of the distal femur extending to the level of the arthroplasty. Electronically Signed   By: Darliss Cheney M.D.   On: 07/17/2022 21:06      LOS: 1 day    Joycelyn Das, MD [facility] Available via Epic secure chat 7am-7pm After these hours, please refer to coverage provider listed on [email or web address] 07/19/2022, 10:35 AM

## 2022-07-19 NOTE — Anesthesia Postprocedure Evaluation (Signed)
Anesthesia Post Note  Patient: Jodi [name]  Procedure(s) Performed: OPEN REDUCTION INTERNAL FIXATION (ORIF) DISTAL FEMUR FRACTURE (Right)     Patient location during evaluation: PACU Anesthesia Type: General Level of consciousness: awake and alert Pain management: pain level controlled Vital Signs Assessment: post-procedure vital signs reviewed and stable Respiratory status: spontaneous breathing, nonlabored ventilation, respiratory function stable and patient connected to nasal cannula oxygen Cardiovascular status: blood pressure returned to baseline and stable Postop Assessment: no apparent nausea or vomiting Anesthetic complications: no   No notable events documented.  Last Vitals:  Vitals:   07/19/22 1407 07/19/22 1427  BP: (!) 142/54 (!) 146/57  Pulse:  84  Resp:  18  Temp:    SpO2:      Last Pain:  Vitals:   07/19/22 1342  TempSrc:   PainSc: 8                  Collene Schlichter

## 2022-07-19 NOTE — Plan of Care (Signed)

## 2022-07-19 NOTE — Progress Notes (Signed)
Notified MD about post op vital changes. Check flowsheets

## 2022-07-19 NOTE — Interval H&P Note (Signed)
History and Physical Interval Note:  07/19/2022 9:27 AM  Jodi [name]  has presented today for surgery, with the diagnosis of distal femur fracture.  The various methods of treatment have been discussed with the patient and family. After consideration of risks, benefits and other options for treatment, the patient has consented to  Procedure(s): OPEN REDUCTION INTERNAL FIXATION (ORIF) DISTAL FEMUR FRACTURE (Right) as a surgical intervention.  The patient's history has been reviewed, patient examined, no change in status, stable for surgery.  I have reviewed the patient's chart and labs.  Questions were answered to the patient's satisfaction.     Caryn Bee P Jailan Trimm

## 2022-07-19 NOTE — Transfer of Care (Signed)
Immediate Anesthesia Transfer of Care Note  Patient: Jodi [name]  Procedure(s) Performed: OPEN REDUCTION INTERNAL FIXATION (ORIF) DISTAL FEMUR FRACTURE (Right)  Patient Location: PACU  Anesthesia Type:General  Level of Consciousness: drowsy and patient cooperative  Airway & Oxygen Therapy: Patient Spontanous Breathing  Post-op Assessment: Report given to RN and Post -op Vital signs reviewed and stable  Post vital signs: Reviewed and stable  Last Vitals:  Vitals Value Taken Time  BP 169/120 07/19/22 1149  Temp    Pulse 87 07/19/22 1151  Resp 18 07/19/22 1151  SpO2 99 % 07/19/22 1151  Vitals shown include unfiled device data.  Last Pain:  Vitals:   07/19/22 0859  TempSrc:   PainSc: 8       Patients Stated Pain Goal: 0 (07/19/22 0557)  Complications: No notable events documented.

## 2022-07-19 NOTE — Anesthesia Preprocedure Evaluation (Addendum)
Anesthesia Evaluation  Patient identified by MRN, date of birth, ID band Patient awake    Reviewed: Allergy & Precautions, NPO status , Patient's Chart, lab work & pertinent test results  Airway Mallampati: II  TM Distance: >3 FB Neck ROM: Full    Dental  (+) Dental Advisory Given, Edentulous Upper, Edentulous Lower   Pulmonary sleep apnea    Pulmonary exam normal breath sounds clear to auscultation       Cardiovascular hypertension, Normal cardiovascular exam Rhythm:Regular Rate:Normal     Neuro/Psych  PSYCHIATRIC DISORDERS Anxiety Depression    negative neurological ROS     GI/Hepatic negative GI ROS, Neg liver ROS,,,S/p gastric bypass    Endo/Other  diabetes    Renal/GU Renal InsufficiencyRenal disease     Musculoskeletal  (+) Arthritis ,  distal femur fracture   Abdominal   Peds  Hematology  (+) Blood dyscrasia, anemia   Anesthesia Other Findings Day of surgery medications reviewed with the patient.  Reproductive/Obstetrics                             Anesthesia Physical Anesthesia Plan  ASA: 3  Anesthesia Plan: General   Post-op Pain Management: Tylenol PO (pre-op)* and Ketamine IV*   Induction: Intravenous  PONV Risk Score and Plan: 3 and Dexamethasone and Ondansetron  Airway Management Planned: Oral ETT  Additional Equipment:   Intra-op Plan:   Post-operative Plan: Extubation in OR  Informed Consent: I have reviewed the patients History and Physical, chart, labs and discussed the procedure including the risks, benefits and alternatives for the proposed anesthesia with the patient or authorized representative who has indicated his/her understanding and acceptance.     Dental advisory given  Plan Discussed with: CRNA  Anesthesia Plan Comments:        Anesthesia Quick Evaluation

## 2022-07-20 DIAGNOSIS — N1831 Chronic kidney disease, stage 3a: Secondary | ICD-10-CM | POA: Diagnosis not present

## 2022-07-20 DIAGNOSIS — Z96642 Presence of left artificial hip joint: Secondary | ICD-10-CM | POA: Diagnosis not present

## 2022-07-20 DIAGNOSIS — Z9884 Bariatric surgery status: Secondary | ICD-10-CM | POA: Diagnosis not present

## 2022-07-20 DIAGNOSIS — S7291XK Unspecified fracture of right femur, subsequent encounter for closed fracture with nonunion: Secondary | ICD-10-CM | POA: Diagnosis not present

## 2022-07-20 LAB — CBC
HCT: 22.4 % — ABNORMAL LOW (ref 36.0–46.0)
Hemoglobin: 7.3 g/dL — ABNORMAL LOW (ref 12.0–15.0)
MCH: 29.6 pg (ref 26.0–34.0)
MCHC: 32.6 g/dL (ref 30.0–36.0)
MCV: 90.7 fL (ref 80.0–100.0)
Platelets: 148 10*3/uL — ABNORMAL LOW (ref 150–400)
RBC: 2.47 MIL/uL — ABNORMAL LOW (ref 3.87–5.11)
RDW: 12.8 % (ref 11.5–15.5)
WBC: 9.5 10*3/uL (ref 4.0–10.5)
nRBC: 0 % (ref 0.0–0.2)

## 2022-07-20 LAB — GLUCOSE, CAPILLARY
Glucose-Capillary: 111 mg/dL — ABNORMAL HIGH (ref 70–99)
Glucose-Capillary: 126 mg/dL — ABNORMAL HIGH (ref 70–99)
Glucose-Capillary: 142 mg/dL — ABNORMAL HIGH (ref 70–99)
Glucose-Capillary: 208 mg/dL — ABNORMAL HIGH (ref 70–99)
Glucose-Capillary: 218 mg/dL — ABNORMAL HIGH (ref 70–99)
Glucose-Capillary: 72 mg/dL (ref 70–99)

## 2022-07-20 LAB — BASIC METABOLIC PANEL
Anion gap: 5 (ref 5–15)
BUN: 17 mg/dL (ref 8–23)
CO2: 26 mmol/L (ref 22–32)
Calcium: 8 mg/dL — ABNORMAL LOW (ref 8.9–10.3)
Chloride: 106 mmol/L (ref 98–111)
Creatinine, Ser: 1.12 mg/dL — ABNORMAL HIGH (ref 0.44–1.00)
GFR, Estimated: 51 mL/min — ABNORMAL LOW (ref >60)
Glucose, Bld: 127 mg/dL — ABNORMAL HIGH (ref 70–99)
Potassium: 3.7 mmol/L (ref 3.5–5.1)
Sodium: 137 mmol/L (ref 135–145)

## 2022-07-20 LAB — MAGNESIUM: Magnesium: 1.8 mg/dL (ref 1.7–2.4)

## 2022-07-20 NOTE — Progress Notes (Signed)
PROGRESS NOTE    Jodi [name]  ZOX:[id number] DOB: 08-10-1945 DOA: 07/18/2022 PCP: Sherlene Shams, MD    Brief Narrative:  Same day note  Jodi [name] is a 77 y.o. female with medical history significant for right total knee replacement, type 2 diabetes, hypertension, obesity, OSA on CPAP, CKD 3B, who had initially presented to [facility] ED with complaints of a fall and right knee pain.  She tripped while trying to put a leash on a dog and landed on her right knee.  In the ED, right knee x-ray revealed acute comminuted fracture of the distal femur extending to the level of the arthroplasty.  EDP discussed the case with orthopedic surgery at [facility] who felt patient would benefit from Ortho trauma evaluation.  The patient initially requested [facility] where she previously had her knee replacement, but they did not have any beds and could not accept her.  EDP also discussed the case with orthopedic surgery at [facility] Dr. Victorino Dike, who recommended transfer to [facility] and admission by the hospitalist service.  In the ED, patient was hypersomnolent likely from narcotics and was put on CPAP.  Patient was afebrile with stable vitals.  Patient had hyperglycemia with glucose of 251,  Creatinine of 129 and hemoglobin was 10.8.  Patient was then admitted to the hospital for further evaluation and treatment.   Assessment and Plan.  Acute comminuted fracture of the right distal femur extending to the level of the arthroplasty, Orthopedic surgery Dr. Victorino Dike, recommended transfer from [facility] to [facility] for surgical evaluation.    Seen by orthopedics and underwent open reduction and internal fixation of right supracondylar distal femur fracture on 07/19/2022.  Touchdown weightbearing of the right lower extremity and Ancef 24 hours postop recommended.  Plan for oral DOAC on discharge for DVT prophylaxis with outpatient follow-up in 2 weeks for wound check with Dr. Jena Gauss.    Acute metabolic encephalopathy,  hypersomnolence, suspect side effect from narcotics Resolved now.  Was initially on CPAP currently on room air.  Patient does have history of obstructive sleep apnea and is supposed to be on CPAP.   History of type 2 diabetes with hyperglycemia  Hemoglobin A1c of 5.3 at this time.  Not on medications at home.  Status post gastric bypass.  Continue sliding scale insulin as needed   CKD 3b Creatinine on presentation at 1.1.  Likely at baseline.  Monitor BMP.  Obesity Body mass index is 36.44 kg/m. Would recommend outpatient follow-up for weight loss management.  History of obstructive sleep apnea.  On CPAP.  Advised compliance with this.   DVT prophylaxis: enoxaparin (LOVENOX) injection 40 mg Start: 07/20/22 0800 SCDs Start: 07/19/22 1248 SCDs Start: 07/18/22 0319   Code Status:     Code Status: Full Code  Disposition: Uncertain, awaiting PT OT evaluation.  Status is: Inpatient  Remains inpatient appropriate because: Status post open reduction internal fixation, await PT OT evaluation   Family Communication: None at bedside   Consultants:  orthopedics  Procedures:  BiPAP placement Open reduction and internal fixation of distal femur fracture on 07/19/2022  Antimicrobials:  Preop cefazolin  Anti-infectives (From admission, onward)    Start     Dose/Rate Route Frequency Ordered Stop   07/19/22 1800  ceFAZolin (ANCEF) IVPB 2g/100 mL premix        2 g 200 mL/hr over 30 Minutes Intravenous Every 8 hours 07/19/22 1248 07/20/22 1019   07/19/22 1049  vancomycin (VANCOCIN) powder  Status:  Discontinued          As needed 07/19/22 1049 07/19/22 1145   07/19/22 0600  ceFAZolin (ANCEF) IVPB 2g/100 mL premix        2 g 200 mL/hr over 30 Minutes Intravenous To Short Stay 07/18/22 2349 07/19/22 1033        Subjective: Today, patient was seen and examined at bedside.  Patient stated that she did not sleep well due to pain after surgery.  No nausea vomiting fever chills or  rigor.    Objective: Vitals:   07/19/22 1427 07/19/22 2009 07/20/22 0418 07/20/22 1000  BP: (!) 146/57 (!) 132/52 (!) 147/59 (!) 154/56  Pulse: 84 90 79 92  Resp: 18 18 13 14   Temp:  97.6 F (36.4 C) 98.4 F (36.9 C) 99.4 F (37.4 C)  TempSrc:  Axillary Oral Oral  SpO2:  99% 100% 100%  Weight:      Height:        Intake/Output Summary (Last 24 hours) at 07/20/2022 1127 Last data filed at 07/19/2022 1133 Gross per 24 hour  Intake --  Output 100 ml  Net -100 ml   Filed Weights   07/18/22 0423 07/19/22 0842  Weight: 96.3 kg 97.5 kg    Physical Examination: Body mass index is 36.9 kg/m.  General: Obese built, not in obvious distress HENT:   No scleral pallor or icterus noted. Oral mucosa is moist.  Chest:  Clear breath sounds. No crackles or wheezes.  CVS: S1 &S2 heard. No murmur.  Regular rate and rhythm. Abdomen: Soft, nontender, nondistended.  Bowel sounds are heard.   Extremities: No cyanosis, clubbing or edema.  Peripheral pulses are palpable.  Right lower extremity with dressing, clean dry and intact.  Distal pulses palpable.   Psych: Alert, awake and oriented, normal mood CNS:  No cranial nerve deficits.   Skin: Warm and dry.  No rashes noted.  Data Reviewed:   CBC: Recent Labs  Lab 07/17/22 2112 07/18/22 0400 07/19/22 0249 07/19/22 1432 07/20/22 0351  WBC 10.6* 7.0 7.8 8.9 9.5  NEUTROABS 8.8*  --   --   --   --   HGB 11.5* 10.8* 9.2* 9.0* 7.3*  HCT 35.3* 33.5* 28.1* 26.9* 22.4*  MCV 92.4 91.5 92.7 92.4 90.7  PLT 228 206 173 175 148*    Basic Metabolic Panel: Recent Labs  Lab 07/17/22 2112 07/18/22 0400 07/19/22 0249 07/19/22 1432 07/20/22 0351  NA 140 138 138  --  137  K 3.4* 4.0 3.7  --  3.7  CL 107 104 106  --  106  CO2 24 24 27   --  26  GLUCOSE 132* 251* 80  --  127*  BUN 23 22 21   --  17  CREATININE 1.16* 1.29* 1.19* 1.14* 1.12*  CALCIUM 8.5* 8.5* 8.3*  --  8.0*  MG  --  1.8 1.8  --  1.8  PHOS  --  4.4  --   --   --     Liver  Function Tests: No results for input(s): "AST", "ALT", "ALKPHOS", "BILITOT", "PROT", "ALBUMIN" in the last 168 hours.   Radiology Studies: DG FEMUR PORT, MIN 2 VIEWS RIGHT  Result Date: 07/19/2022 CLINICAL DATA:  Follow-up femur fracture EXAM: RIGHT FEMUR PORTABLE 2 VIEW COMPARISON:  07/17/2022 FINDINGS: Status post surgical plate and multiple screw fixation of acute comminuted fracture involving the distal femoral shaft and extending to level of right knee prosthesis. Decreased fracture displacement and angulation. Residual long 17 mm  displaced medial shaft fracture fragment with 2 most distal screw tips partially anchored within IMPRESSION: Status post ORIF of distal femoral shaft fracture with decreased displacement and angulation. Electronically Signed   By: Jasmine Pang M.D.   On: 07/19/2022 15:23   DG FEMUR, MIN 2 VIEWS RIGHT  Result Date: 07/19/2022 CLINICAL DATA:  Open reduction internal fixation EXAM: RIGHT FEMUR 2 VIEWS COMPARISON:  07/17/2022 FINDINGS: Seven low resolution intraoperative spot views of the right femur. Total fluoroscopy time was 1 minutes 50 seconds, fluoroscopic dose of 15.43 mGy. The images demonstrate a right knee replacement. There is placement of surgical plate and multiple fixating screws across comminuted distal femoral periprosthetic fracture. IMPRESSION: Intraoperative fluoroscopic assistance provided during ORIF of distal femoral periprosthetic fracture. Electronically Signed   By: Jasmine Pang M.D.   On: 07/19/2022 15:20   DG C-Arm 1-60 Min-No Report  Result Date: 07/19/2022 Fluoroscopy was utilized by the requesting physician.  No radiographic interpretation.      LOS: 2 days    Joycelyn Das, MD [facility] Available via Epic secure chat 7am-7pm After these hours, please refer to coverage provider listed on [email or web address] 07/20/2022, 11:27 AM

## 2022-07-20 NOTE — Evaluation (Signed)
Physical Therapy Evaluation Patient Details Name: Jodi [name] MRN: [id number] DOB: 07/23/1945 Today's Date: 07/20/2022  History of Present Illness  77 y.o. female who initially presented to [facility] ED with complaints of a fall and right knee pain. He is s/p ORIF of R distal femur 07/19/22. Past medical history significant for right total knee replacement, type 2 diabetes, hypertension, obesity, OSA on CPAP, CKD 3B,  Clinical Impression  PTA pt reports living with son and family in single story home with 4 steps to enter. Pt reports independence with ambulation, ADLs and iADLs. Pt currently limited in safe mobility by R LE pain, R LE  TWB precautions, decreased strength, balance and endurance. Pt is maxAx2 for bed mobility and total Ax2 for transfer to recliner. Patient will benefit from continued inpatient follow up therapy, <3 hours/day. PT will continue to follow acutely.          Assistance Recommended at Discharge Frequent or constant Supervision/Assistance  If plan is discharge home, recommend the following:  Can travel by private vehicle  Two people to help with walking and/or transfers;Two people to help with bathing/dressing/bathroom;Assistance with cooking/housework;Assist for transportation;Direct supervision/assist for medications management;Help with stairs or ramp for entrance   No    Equipment Recommendations Wheelchair (measurements PT);Wheelchair cushion (measurements PT);Hospital bed;Other (comment) (hoyer lift)     Functional Status Assessment Patient has had a recent decline in their functional status and demonstrates the ability to make significant improvements in function in a reasonable and predictable amount of time.     Precautions / Restrictions Precautions Precautions: Fall Restrictions Weight Bearing Restrictions: Yes RLE Weight Bearing: Touchdown weight bearing      Mobility  Bed Mobility Overal bed mobility: Needs Assistance Bed Mobility: Supine to  Sit     Supine to sit: Max assist, +2 for physical assistance, +2 for safety/equipment     General bed mobility comments: maxAx2 and maximal cuing for sequencing use of bed rails and to pull to EoB and B UE to offweight hips to scoot forward to EoB.    Transfers Overall transfer level: Needs assistance Equipment used: None Transfers: Bed to chair/wheelchair/BSC   Stand pivot transfers: Total assist, +2 physical assistance, +2 safety/equipment         General transfer comment: Pt initially not utilizing pivot technique during transfer despite verbalizing understanding prior to attempt          Balance Overall balance assessment: Needs assistance Sitting-balance support: Single extremity supported, Feet supported Sitting balance-Leahy Scale: Fair       Standing balance-Leahy Scale: Zero                               Pertinent Vitals/Pain Pain Assessment Pain Assessment: Faces Faces Pain Scale: Hurts even more Breathing: normal Negative Vocalization: none Consolability: no need to console Pain Location: R leg Pain Descriptors / Indicators: Aching, Discomfort, Grimacing, Guarding Pain Intervention(s): Limited activity within patient's tolerance, Monitored during session, Premedicated before session, Repositioned    Home Living Family/patient expects to be discharged to:: Private residence Living Arrangements: Children (son and grand daughter) Available Help at Discharge: Family;Available PRN/intermittently Type of Home: House Home Access: Stairs to enter Entrance Stairs-Rails: None Entrance Stairs-Number of Steps: 4   Home Layout: One level Home Equipment: Agricultural consultant (2 wheels);Cane - single point;Tub bench;BSC/3in1      Prior Function Prior Level of Function : Independent/Modified Independent  Mobility Comments: ambulated with [facility] ADLs Comments: ind        Extremity/Trunk Assessment   Upper Extremity Assessment Upper  Extremity Assessment: Defer to OT evaluation    Lower Extremity Assessment Lower Extremity Assessment: RLE deficits/detail;LLE deficits/detail RLE: Unable to fully assess due to pain;Unable to fully assess due to immobilization LLE Deficits / Details: ROM lacking full ROM, strength 2+/5 pt utilizes UE to move L LE LLE Sensation: decreased light touch    Cervical / Trunk Assessment Cervical / Trunk Assessment: Other exceptions Cervical / Trunk Exceptions: increased body habitus  Communication   Communication: HOH  Cognition Arousal/Alertness: Awake/alert Behavior During Therapy: WFL for tasks assessed/performed Overall Cognitive Status: Difficult to assess                                 General Comments: Pt displaying some slow processing, sequences ADLs appropriately        General Comments General comments (skin integrity, edema, etc.): Sp02 fluctuating on RA, unable to get accurate pleth. Pt returned to 1L 02 at end of session as she reported SOB following transfer with SP02 up to 94%, HR up to 127 bpm with transfer        Assessment/Plan    PT Assessment Patient needs continued PT services  PT Problem List Decreased strength;Decreased range of motion;Decreased activity tolerance;Decreased balance;Decreased mobility;Decreased coordination;Decreased cognition;Decreased safety awareness;Pain       PT Treatment Interventions DME instruction;Gait training;Functional mobility training;Therapeutic activities;Therapeutic exercise;Balance training;Cognitive remediation;Patient/family education    PT Goals (Current goals can be found in the Care Plan section)  Acute Rehab PT Goals PT Goal Formulation: With patient Time For Goal Achievement: 08/03/22 Potential to Achieve Goals: Fair    Frequency Min 3X/week     Co-evaluation   Reason for Co-Treatment: Complexity of the patient's impairments (multi-system involvement);For patient/therapist safety;To address  functional/ADL transfers PT goals addressed during session: Mobility/safety with mobility;Balance OT goals addressed during session: ADL's and self-care       AM-PAC PT "6 Clicks" Mobility  Outcome Measure Help needed turning from your back to your side while in a flat bed without using bedrails?: Total Help needed moving from lying on your back to sitting on the side of a flat bed without using bedrails?: Total Help needed moving to and from a bed to a chair (including a wheelchair)?: Total Help needed standing up from a chair using your arms (e.g., wheelchair or bedside chair)?: Total Help needed to walk in hospital room?: Total Help needed climbing 3-5 steps with a railing? : Total 6 Click Score: 6    End of Session Equipment Utilized During Treatment: Gait belt;Oxygen Activity Tolerance: Patient limited by pain Patient left: in chair;with call bell/phone within reach;with chair alarm set;Other (comment) (lift pad in chair) Nurse Communication: Mobility status PT Visit Diagnosis: Other abnormalities of gait and mobility (R26.89);Muscle weakness (generalized) (M62.81);History of falling (Z91.81);Difficulty in walking, not elsewhere classified (R26.2);Other symptoms and signs involving the nervous system (R29.898);Pain Pain - Right/Left: Right Pain - part of body: Leg    Time: 5409-8119 PT Time Calculation (min) (ACUTE ONLY): 40 min   Charges:   PT Evaluation $PT Eval Moderate Complexity: 1 Mod   PT General Charges $$ ACUTE PT VISIT: 1 Visit         Hue Steveson B. Beverely Risen PT, DPT Acute Rehabilitation Services Please use secure chat or  Call Office [phone]   Courtney Paris  [employer] 07/20/2022, 5:33 PM

## 2022-07-20 NOTE — Evaluation (Signed)
Occupational Therapy Evaluation Patient Details Name: Jodi [name] MRN: [id number] DOB: Dec 06, 1945 Today's Date: 07/20/2022   History of Present Illness 77 y.o. female who initially presented to [facility] ED with complaints of a fall and right knee pain. He is s/p ORIF of R distal femur 07/19/22. Past medical history significant for right total knee replacement, type 2 diabetes, hypertension, obesity, OSA on CPAP, CKD 3B,   Clinical Impression   Pt admitted for above dx, PTA pt reports being ind in bADLs and ambulating with SPC. Pt presenting with RLE pain and impaired balance, she requires UE support to sit EOB and is Max A+2 for bed mobility and Total A+2 for pivot transfers. Pt needing Max A to total A for LB ADLs. Pt would benefit from continued acute skilled OT services to address deficits and help transition to next level of care. Patient would benefit from post acute skilled rehab facility with <3 hours of therapy and 24/7 support       Recommendations for follow up therapy are one component of a multi-disciplinary discharge planning process, led by the attending physician.  Recommendations may be updated based on patient status, additional functional criteria and insurance authorization.   Assistance Recommended at Discharge Frequent or constant Supervision/Assistance  Patient can return home with the following Two people to help with walking and/or transfers;A lot of help with bathing/dressing/bathroom;Assistance with cooking/housework;Assist for transportation;Help with stairs or ramp for entrance    Functional Status Assessment  Patient has had a recent decline in their functional status and demonstrates the ability to make significant improvements in function in a reasonable and predictable amount of time.  Equipment Recommendations  None recommended by OT (defer to next level of care)    Recommendations for Other Services       Precautions / Restrictions Precautions Precautions:  Fall Restrictions Weight Bearing Restrictions: Yes RLE Weight Bearing: Touchdown weight bearing      Mobility Bed Mobility Overal bed mobility: Needs Assistance Bed Mobility: Supine to Sit     Supine to sit: Max assist, +2 for physical assistance, +2 for safety/equipment     General bed mobility comments: Pt left sitting in recliner    Transfers Overall transfer level: Needs assistance Equipment used: None Transfers: Bed to chair/wheelchair/BSC   Stand pivot transfers: Total assist, +2 physical assistance, +2 safety/equipment         General transfer comment: Pt initially not utilizing pivot technique during transfer despite verbalizing understanding prior to attempt      Balance Overall balance assessment: Needs assistance Sitting-balance support: Single extremity supported, Feet supported Sitting balance-Leahy Scale: Fair       Standing balance-Leahy Scale: Zero                             ADL either performed or assessed with clinical judgement   ADL Overall ADL's : Needs assistance/impaired Eating/Feeding: Independent;Sitting   Grooming: Wash/dry face;Sitting;Min guard   Upper Body Bathing: Modified independent;Bed level   Lower Body Bathing: Total assistance;Sit to/from stand   Upper Body Dressing : Sitting;Set up;Supervision/safety   Lower Body Dressing: Total assistance;Bed level Lower Body Dressing Details (indicate cue type and reason): wears slip ons at baseline Toilet Transfer: Total assistance;+2 for physical assistance     Toileting - Clothing Manipulation Details (indicate cue type and reason): NT   Tub/Shower Transfer Details (indicate cue type and reason): NT         Vision Baseline  Vision/History: 1 Wears glasses Additional Comments: Pt with low vision, is nearsighted with glasses     Perception     Praxis      Pertinent Vitals/Pain Pain Assessment Pain Assessment: Faces Faces Pain Scale: Hurts even more Pain  Location: R leg Pain Descriptors / Indicators: Aching, Discomfort, Grimacing, Guarding Pain Intervention(s): RN gave pain meds during session, Limited activity within patient's tolerance, Repositioned, Monitored during session     Hand Dominance     Extremity/Trunk Assessment Upper Extremity Assessment Upper Extremity Assessment: Generalized weakness   Lower Extremity Assessment Lower Extremity Assessment: Defer to PT evaluation   Cervical / Trunk Assessment Cervical / Trunk Assessment: Other exceptions Cervical / Trunk Exceptions: increased body habitus   Communication Communication Communication: HOH   Cognition Arousal/Alertness: Awake/alert Behavior During Therapy: WFL for tasks assessed/performed Overall Cognitive Status: Difficult to assess                                 General Comments: Pt displaying some slow processing, sequences ADLs appropriately     General Comments  Sp02 fluctuating on RA, unable to get accurate pleth. Pt returned to 1L 02 at end of session as she reported SOB following transfer with SP02 up to 94%, HR up to 127 bpm with transfer    Exercises     Shoulder Instructions      Home Living Family/patient expects to be discharged to:: Private residence Living Arrangements: Children (son and grand daughter) Available Help at Discharge: Family;Available PRN/intermittently Type of Home: House Home Access: Stairs to enter [employer] of Steps: 4 Entrance Stairs-Rails: None Home Layout: One level     Bathroom Shower/Tub: Chief Strategy Officer: Standard Bathroom Accessibility: Yes   Home Equipment: Agricultural consultant (2 wheels);Cane - single point;Tub bench;BSC/3in1          Prior Functioning/Environment Prior Level of Function : Independent/Modified Independent             Mobility Comments: ambulated with SPC ADLs Comments: ind        OT Problem List: Decreased strength;Impaired balance  (sitting and/or standing);Pain      OT Treatment/Interventions: Self-care/ADL training;Therapeutic activities;Therapeutic exercise;Patient/family education;Balance training;DME and/or AE instruction    OT Goals(Current goals can be found in the care plan section) Acute Rehab OT Goals Patient Stated Goal: To get better OT Goal Formulation: With patient Time For Goal Achievement: 08/03/22 Potential to Achieve Goals: Good  OT Frequency: Min 1X/week    Co-evaluation PT/OT/SLP Co-Evaluation/Treatment: Yes Reason for Co-Treatment: Complexity of the patient's impairments (multi-system involvement);For patient/therapist safety;To address functional/ADL transfers PT goals addressed during session: Mobility/safety with mobility;Balance OT goals addressed during session: ADL's and self-care      AM-PAC OT "6 Clicks" Daily Activity     Outcome Measure Help from another person eating meals?: None Help from another person taking care of personal grooming?: A Little   Help from another person bathing (including washing, rinsing, drying)?: A Lot Help from another person to put on and taking off regular upper body clothing?: A Little Help from another person to put on and taking off regular lower body clothing?: Total 6 Click Score: 13   End of Session Equipment Utilized During Treatment: Gait belt Nurse Communication: Need for lift equipment (maxi sky)  Activity Tolerance: Patient tolerated treatment well Patient left: in chair;with call bell/phone within reach  OT Visit Diagnosis: Unsteadiness on feet (R26.81);Other abnormalities of gait  and mobility (R26.89);Pain;Muscle weakness (generalized) (M62.81) Pain - Right/Left: Right Pain - part of body: Leg                Time: 7846-9629 OT Time Calculation (min): 46 min Charges:  OT General Charges $OT Visit: 1 Visit OT Evaluation $OT Eval Moderate Complexity: 1 Mod  07/20/2022  AB, OTR/L  Acute Rehabilitation Services  Office:  [phone]   Tristan Schroeder 07/20/2022, 4:10 PM

## 2022-07-20 NOTE — Plan of Care (Signed)

## 2022-07-20 NOTE — Progress Notes (Signed)
    1 Day Post-Op Procedure(s) (LRB): OPEN REDUCTION INTERNAL FIXATION (ORIF) DISTAL FEMUR FRACTURE (Right)  Subjective:  Patient reports pain as moderate.  Didn't sleep well due to pain/soreness.  Requesting PRN pain medicine and breakfast.  Denies numbness or tingling.  No other complaints.  Objective:   VITALS:   Vitals:   07/19/22 1407 07/19/22 1427 07/19/22 2009 07/20/22 0418  BP: (!) 142/54 (!) 146/57 (!) 132/52 (!) 147/59  Pulse:  84 90 79  Resp:  18 18 13   Temp:   97.6 F (36.4 C) 98.4 F (36.9 C)  TempSrc:   Axillary Oral  SpO2:   99% 100%  Weight:      Height:        AAOx4, sitting comfortably ABD soft Neurovascular intact Sensation intact distally Intact pulses distally Gentle Dorsiflexion/Plantar flexion intact Incision: dressing C/D/I Compartment soft Wiggles toes appropriately   Lab Results  Component Value Date   WBC 9.5 07/20/2022   HGB 7.3 (L) 07/20/2022   HCT 22.4 (L) 07/20/2022   MCV 90.7 07/20/2022   PLT 148 (L) 07/20/2022   BMET    Component Value Date/Time   NA 137 07/20/2022 0351   K 3.7 07/20/2022 0351   CL 106 07/20/2022 0351   CO2 26 07/20/2022 0351   GLUCOSE 127 (H) 07/20/2022 0351   BUN 17 07/20/2022 0351   CREATININE 1.12 (H) 07/20/2022 0351   CALCIUM 8.0 (L) 07/20/2022 0351   GFRNONAA 51 (L) 07/20/2022 0351     Xray: stable post-operative imaging  Assessment/Plan: 1 Day Post-Op   Principal Problem:   Femur fracture, right (HCC) Active Problems:   History of gastric bypass   Personal history of diabetes mellitus   OSA (obstructive sleep apnea)   Obesity   History of total left hip replacement   CKD (chronic kidney disease) stage 3, GFR 30-59 ml/min (HCC)   Advance diet Mobilize with PT/OT Incentive Spirometry Elevate and Apply ice Monitor Hgb closely, drop to 7.3 this am from 9.0 yesterday and 11.5 three days ago.  May consider holding Lovenox tomorrow    Post op recs: WB: TDWB of RLE Abx: ancef x23  hours post op Imaging: PACU xrays Dressing: keep intact until follow up, change PRN if soiled or saturated. DVT prophylaxis: Lovenox in house, DC on oral DOAC Follow up: 2 weeks after surgery for a wound check with Dr. Jena Gauss DC: per medicine   Cecil Cobbs 07/20/2022, 7:16 AM   Weber Cooks, MD  Contact information:   [phone] 7am-5pm epic message Dr. Blanchie Dessert, or call office for patient follow up: [phone] After hours and holidays please check [email or web address] for group call information for [facility]

## 2022-07-21 DIAGNOSIS — Z96642 Presence of left artificial hip joint: Secondary | ICD-10-CM | POA: Diagnosis not present

## 2022-07-21 DIAGNOSIS — Z9884 Bariatric surgery status: Secondary | ICD-10-CM | POA: Diagnosis not present

## 2022-07-21 DIAGNOSIS — N1831 Chronic kidney disease, stage 3a: Secondary | ICD-10-CM | POA: Diagnosis not present

## 2022-07-21 DIAGNOSIS — S7291XK Unspecified fracture of right femur, subsequent encounter for closed fracture with nonunion: Secondary | ICD-10-CM | POA: Diagnosis not present

## 2022-07-21 LAB — CBC
HCT: 21.6 % — ABNORMAL LOW (ref 36.0–46.0)
Hemoglobin: 7.1 g/dL — ABNORMAL LOW (ref 12.0–15.0)
MCH: 30 pg (ref 26.0–34.0)
MCHC: 32.9 g/dL (ref 30.0–36.0)
MCV: 91.1 fL (ref 80.0–100.0)
Platelets: 150 10*3/uL (ref 150–400)
RBC: 2.37 MIL/uL — ABNORMAL LOW (ref 3.87–5.11)
RDW: 13.1 % (ref 11.5–15.5)
WBC: 7 10*3/uL (ref 4.0–10.5)
nRBC: 0 % (ref 0.0–0.2)

## 2022-07-21 LAB — BASIC METABOLIC PANEL
Anion gap: 6 (ref 5–15)
BUN: 13 mg/dL (ref 8–23)
CO2: 27 mmol/L (ref 22–32)
Calcium: 8 mg/dL — ABNORMAL LOW (ref 8.9–10.3)
Chloride: 104 mmol/L (ref 98–111)
Creatinine, Ser: 0.99 mg/dL (ref 0.44–1.00)
GFR, Estimated: 59 mL/min — ABNORMAL LOW (ref >60)
Glucose, Bld: 132 mg/dL — ABNORMAL HIGH (ref 70–99)
Potassium: 3.7 mmol/L (ref 3.5–5.1)
Sodium: 137 mmol/L (ref 135–145)

## 2022-07-21 LAB — GLUCOSE, CAPILLARY
Glucose-Capillary: 115 mg/dL — ABNORMAL HIGH (ref 70–99)
Glucose-Capillary: 131 mg/dL — ABNORMAL HIGH (ref 70–99)
Glucose-Capillary: 91 mg/dL (ref 70–99)
Glucose-Capillary: 149 mg/dL — ABNORMAL HIGH (ref 70–99)
Glucose-Capillary: 160 mg/dL — ABNORMAL HIGH (ref 70–99)
Glucose-Capillary: 185 mg/dL — ABNORMAL HIGH (ref 70–99)

## 2022-07-21 LAB — MAGNESIUM: Magnesium: 1.6 mg/dL — ABNORMAL LOW (ref 1.7–2.4)

## 2022-07-21 MED ORDER — INSULIN ASPART 100 UNIT/ML IJ SOLN
0.0000 [IU] | Freq: Three times a day (TID) | INTRAMUSCULAR | Status: DC
  Administered 2022-07-21: 2 [IU] via SUBCUTANEOUS
  Administered 2022-07-21 – 2022-07-22 (×4): 3 [IU] via SUBCUTANEOUS

## 2022-07-21 NOTE — NC FL2 (Signed)
[place] MEDICAID FL2 LEVEL OF CARE FORM     IDENTIFICATION  Patient Name: Jodi [name] Birthdate: 09/07/1945 Sex: female Admission Date (Current Location): 07/18/2022  [facility] and [place] Number:  Producer, television/film/video and Address:  [facility] [place], [address] [place], [place] [address]      Provider Number: [id number]  Attending Physician Name and Address:  Joycelyn Das, MD  Relative Name and Phone Number:       Current Level of Care: SNF Recommended Level of Care: Skilled Nursing Facility Prior Approval Number:    Date Approved/Denied:   PASRR Number: [id number]  Discharge Plan: SNF    Current Diagnoses: Patient Active Problem List   Diagnosis Date Noted   Femur fracture, right (HCC) 07/18/2022   Tubular adenoma of colon 09/25/2021   Avulsion fracture of right ankle 03/15/2021   CKD (chronic kidney disease) stage 3, GFR 30-59 ml/min (HCC) 09/18/2020   Chronic depressive disorder 09/14/2020   Hypertension associated with diabetes (HCC) 02/16/2020   History of total left hip replacement 02/16/2020   Atherosclerotic cerebrovascular disease 02/16/2020   Trimalleolar fracture of ankle, closed, left, sequela 05/31/2019   Frequent falls 05/31/2018   Arrhythmia 05/09/2016   Degenerative arthritis of carpometacarpal joint of thumb 07/28/2015   Encounter for preventive health examination 04/29/2015   Grief reaction 02/12/2015   DJD (degenerative joint disease) of knee 07/03/2014   Obesity 01/22/2014   OSA (obstructive sleep apnea) 01/19/2014   Anxiety    History of gastric bypass 10/30/2011   Personal history of diabetes mellitus 10/30/2011    Orientation RESPIRATION BLADDER Height & Weight     Self, Time, Situation, Place  O2 (4L) Continent Weight: 215 lb (97.5 kg) Height:  5\' 4"  (162.6 cm)  BEHAVIORAL SYMPTOMS/MOOD NEUROLOGICAL BOWEL NUTRITION STATUS      Continent Diet (See DC summary)  AMBULATORY STATUS COMMUNICATION OF NEEDS  Skin   Extensive Assist Verbally Normal                       Personal Care Assistance Level of Assistance  Bathing, Feeding, Dressing Bathing Assistance: Maximum assistance Feeding assistance: Limited assistance Dressing Assistance: Maximum assistance     Functional Limitations Info  Sight, Hearing, Speech Sight Info: Adequate Hearing Info: Adequate Speech Info: Adequate    SPECIAL CARE FACTORS FREQUENCY  PT (By licensed PT), OT (By licensed OT)     PT Frequency: 5x a week OT Frequency: 5x a week            Contractures Contractures Info: Not present    Additional Factors Info  Code Status, Allergies, Insulin Sliding Scale Code Status Info: full Allergies Info: Morphine And Codeine   Insulin Sliding Scale Info: See DC summary       Current Medications (07/21/2022):  This is the current hospital active medication list Current Facility-Administered Medications  Medication Dose Route Frequency Provider Last Rate Last Admin   acetaminophen (TYLENOL) tablet 650 mg  650 mg Oral Q6H PRN McClung, Sarah A, PA-C       atorvastatin (LIPITOR) tablet 10 mg  10 mg Oral Daily Thyra Breed A, PA-C   10 mg at 07/21/22 0940   docusate sodium (COLACE) capsule 100 mg  100 mg Oral BID West Bali, PA-C   100 mg at 07/21/22 0940   gabapentin (NEURONTIN) capsule 100 mg  100 mg Oral TID Thyra Breed A, PA-C   100 mg at 07/21/22 1505  HYDROmorphone (DILAUDID) injection 0.5 mg  0.5 mg Intravenous Q4H PRN [place] Bali, PA-C   0.5 mg at 07/19/22 1324   insulin aspart (novoLOG) injection 0-15 Units  0-15 Units Subcutaneous TID AC & HS Pokhrel, Laxman, MD   2 Units at 07/21/22 1145   oxyCODONE-acetaminophen (PERCOCET/ROXICET) 5-325 MG per tablet 1 tablet  1 tablet Oral Q4H PRN West Bali, PA-C   1 tablet at 07/21/22 0940   pantoprazole (PROTONIX) EC tablet 40 mg  40 mg Oral Daily Thyra Breed A, PA-C   40 mg at 07/21/22 0940   polyethylene glycol (MIRALAX / GLYCOLAX)  packet 17 g  17 g Oral Daily PRN Thyra Breed A, PA-C       prochlorperazine (COMPAZINE) injection 5 mg  5 mg Intravenous Q6H PRN [place] Bali, PA-C   5 mg at 07/18/22 7829     Discharge Medications: Please see discharge summary for a list of discharge medications.  Relevant Imaging Results:  Relevant Lab Results:   Additional Information SSN [id number]  Arlicia Paquette, [place]

## 2022-07-21 NOTE — Plan of Care (Signed)
  Problem: Education: Goal: Knowledge of General Education information will improve Description: Including pain rating scale, medication(s)/side effects and non-pharmacologic comfort measures Outcome: Progressing   Problem: Health Behavior/Discharge Planning: Goal: Ability to manage health-related needs will improve Outcome: Progressing   Problem: Clinical Measurements: Goal: Will remain free from infection Outcome: Progressing Goal: Respiratory complications will improve Outcome: Progressing   Problem: Activity: Goal: Risk for activity intolerance will decrease Outcome: Progressing   Problem: Nutrition: Goal: Adequate nutrition will be maintained Outcome: Progressing   Problem: Pain Managment: Goal: General experience of comfort will improve Outcome: Progressing

## 2022-07-21 NOTE — Progress Notes (Signed)
   07/21/22 0020  BiPAP/CPAP/SIPAP  Reason BIPAP/CPAP not in use Non-compliant   Pt refused CPAP for night time use.

## 2022-07-21 NOTE — TOC Initial Note (Signed)
Transition of Care [facility]) - Initial/Assessment Note    Patient Details  Name: Jodi [name] MRN: [id number] Date of Birth: 02/01/1945  Transition of Care [facility]) CM/SW Contact:    Jimmy Picket, LCSW Phone Number: 07/21/2022, 12:43 PM  Clinical Narrative:                  CSW received SNF consult. CSW met with pt and daughter at bedside. CSW introduced self and explained role at the hospital. Pt reports that PTA she lives at home alone and was independent with mobility and ADL's.   CSW reviewed PT/OT recommendations for SNF. Pt reports she is aware that she needs SNF before returning home. Pt gave CSW permission to fax out to facilities in the area. Pt has no preference of facility at this time. Pt has been to [facility] in the past. CSW gave pt medicare.gov rating list to review. CSW explained insurance auth process.  CSW will continue to follow.   Expected Discharge Plan: Skilled Nursing Facility Barriers to Discharge: Continued Medical Work up   Patient Goals and CMS Choice Patient states their goals for this hospitalization and ongoing recovery are:: rehab CMS Medicare.gov Compare Post Acute Care list provided to:: Patient Choice offered to / list presented to : Patient, Adult Children      Expected Discharge Plan and Services     Post Acute Care Choice: Skilled Nursing Facility Living arrangements for the past 2 months: Single Family Home                                      Prior Living Arrangements/Services Living arrangements for the past 2 months: Single Family Home Lives with:: Self Patient language and need for interpreter reviewed:: Yes Do you feel safe going back to the place where you live?: Yes      Need for Family Participation in Patient Care: Yes (Comment) Care giver support system in place?: Yes (comment)   Criminal Activity/Legal Involvement Pertinent to Current Situation/Hospitalization: No - Comment as needed  Activities of Daily  Living Home Assistive Devices/Equipment: Cane (specify quad or straight), Dentures (specify type), Eyeglasses ADL Screening (condition at time of admission) Patient's cognitive ability adequate to safely complete daily activities?: Yes Is the patient deaf or have difficulty hearing?: Yes Does the patient have difficulty seeing, even when wearing glasses/contacts?: No Does the patient have difficulty concentrating, remembering, or making decisions?: No Patient able to express need for assistance with ADLs?: Yes Does the patient have difficulty dressing or bathing?: No Independently performs ADLs?: Yes (appropriate for developmental age) Does the patient have difficulty walking or climbing stairs?: Yes Weakness of Legs: Both Weakness of Arms/Hands: Both (arthritis)  Permission Sought/Granted Permission sought to share information with : Facility Medical sales representative, Family Supports Permission granted to share information with : Yes, Verbal Permission Granted     Permission granted to share info w AGENCY: SNF        Emotional Assessment Appearance:: Appears stated age Attitude/Demeanor/Rapport: Engaged Affect (typically observed): Appropriate Orientation: : Oriented to Self, Oriented to Place, Oriented to  Time, Oriented to Situation Alcohol / Substance Use: Not Applicable Psych Involvement: No (comment)  Admission diagnosis:  Femur fracture, right (HCC) [S72.91XA] Patient Active Problem List   Diagnosis Date Noted   Femur fracture, right (HCC) 07/18/2022   Tubular adenoma of colon 09/25/2021   Avulsion fracture of right ankle 03/15/2021  CKD (chronic kidney disease) stage 3, GFR 30-59 ml/min (HCC) 09/18/2020   Chronic depressive disorder 09/14/2020   Hypertension associated with diabetes (HCC) 02/16/2020   History of total left hip replacement 02/16/2020   Atherosclerotic cerebrovascular disease 02/16/2020   Trimalleolar fracture of ankle, closed, left, sequela 05/31/2019    Frequent falls 05/31/2018   Arrhythmia 05/09/2016   Degenerative arthritis of carpometacarpal joint of thumb 07/28/2015   Encounter for preventive health examination 04/29/2015   Grief reaction 02/12/2015   DJD (degenerative joint disease) of knee 07/03/2014   Obesity 01/22/2014   OSA (obstructive sleep apnea) 01/19/2014   Anxiety    History of gastric bypass 10/30/2011   Personal history of diabetes mellitus 10/30/2011   PCP:  Sherlene Shams, MD Pharmacy:   [facility] [phone] - [place], [place] - [address] [place] [address] Phone: [phone] Fax: [phone]  [id number] [place],  - [address] AT [facility] [address] [facility] [address] [place] [address] Phone: [phone] Fax: [phone]  [facility]) - [place], [place] - [address] [facility] [address] [place] [address] [place] [address] Phone: [phone] Fax: [phone]  [facility] - [place], [place] - [address] [place] [address] Phone: [phone] Fax: [phone]     Social Determinants of Health (SDOH) Social History: SDOH Screenings   Food Insecurity: No Food Insecurity (07/18/2022)  Housing: Low Risk  (07/18/2022)  Transportation Needs: No Transportation Needs (07/18/2022)  Utilities: Not At Risk (07/18/2022)  Depression (PHQ2-9): Medium Risk (10/30/2021)  Financial Resource Strain: Low Risk  (10/30/2021)  Social Connections: Unknown (10/30/2021)  Stress: No Stress Concern Present (10/30/2021)  Tobacco Use: Low Risk  (07/19/2022)   SDOH Interventions:     Readmission Risk Interventions     No data to display

## 2022-07-21 NOTE — Progress Notes (Signed)
PROGRESS NOTE    Jodi [name]  NGE:[id number] DOB: [birth year] DOA: 07/18/2022 PCP: Sherlene Shams, MD    Brief Narrative:   Jodi [name] is a 77 y.o. female with medical history significant for right total knee replacement, type 2 diabetes, hypertension, obesity, OSA on CPAP, CKD 3B, who had initially presented to [facility] ED with complaints of a fall and right knee pain.  She tripped while trying to put a leash on a dog and landed on her right knee.  In the ED, right knee x-ray revealed acute comminuted fracture of the distal femur extending to the level of the arthroplasty.  EDP discussed the case with orthopedic surgery at [facility] who felt patient would benefit from Ortho trauma evaluation.  The patient initially requested [facility] where she previously had her knee replacement, but they did not have any beds and could not accept her.  EDP also discussed the case with orthopedic surgery at [facility] Dr. Victorino Dike, who recommended transfer to [facility] and admission by the hospitalist service.  In the ED, patient was hypersomnolent likely from narcotics and was put on CPAP.  Patient was afebrile with stable vitals.  Patient had hyperglycemia with glucose of 251,  creatinine of 1.29 and hemoglobin was 10.8.  Patient was then admitted to the hospital for further evaluation and treatment.   Assessment and Plan.  Acute comminuted fracture of the right distal femur extending to the level of the arthroplasty  Orthopedic surgery Dr. Victorino Dike, recommended transfer from [facility] to [facility] for surgical evaluation.   Patient was seen by orthopedics and underwent open reduction and internal fixation of right supracondylar distal femur fracture on 07/19/2022.  Touchdown weightbearing of the right lower extremity and Ancef 24 hours postop recommended.  Plan for oral DOAC on discharge for DVT prophylaxis with outpatient follow-up in 2 weeks for wound check with Dr. Jena Gauss.  Patient has been seen by physical  therapy at this time and recommended skilled nursing facility placement.   Acute metabolic encephalopathy, hypersomnolence, suspect side effect from narcotics  Resolved now.  Was initially on CPAP currently on room air.  Patient does have history of obstructive sleep apnea and is supposed to be on CPAP.  Currently on nasal cannula oxygen.   History of type 2 diabetes with hyperglycemia  Hemoglobin A1c of 5.3 at this time.  Not on medications at home.  Status post gastric bypass.  Continue sliding scale insulin as needed   CKD 3b Creatinine on presentation at 1.1.  Likely at baseline.  Monitor BMP. Creatinine at 0.9  Obesity Body mass index is 36.44 kg/m. Would recommend outpatient follow-up for weight loss management.  History of obstructive sleep apnea.  On CPAP.  Advised compliance with this.   DVT prophylaxis: SCDs Start: 07/19/22 1248 SCDs Start: 07/18/22 0319   Code Status:     Code Status: Full Code  Disposition:  Skilled nursing facility as per PT evaluation.  Medically stable for disposition.  Status is: Inpatient  Remains inpatient appropriate because: Status post open reduction internal fixation, need for rehabilitation.   Family Communication:  Spoke with the patient's daughter at bedside.  Consultants:  orthopedics  Procedures:  BiPAP placement Open reduction and internal fixation of distal femur fracture on 07/19/2022  Antimicrobials:  none  Subjective: Today, patient was seen and examined at bedside.  Complains of mild pain at the surgical site.  Denies any nausea vomiting fever chills or rigor.  Patient's daughter at bedside.  Objective: Vitals:   07/20/22 2116 07/21/22 0020 07/21/22 0437 07/21/22 0745  BP: (!) 165/70  (!) 155/55 (!) 157/59  Pulse: 90 85 90   Resp: 16 15 13    Temp: 98.6 F (37 C)  98.4 F (36.9 C) 98.3 F (36.8 C)  TempSrc: Oral     SpO2: 99% 99% 100%   Weight:      Height:        Intake/Output Summary (Last 24 hours) at  07/21/2022 1122 Last data filed at 07/21/2022 0942 Gross per 24 hour  Intake 985 ml  Output 1100 ml  Net -115 ml   Filed Weights   07/18/22 0423 07/19/22 0842  Weight: 96.3 kg 97.5 kg    Physical Examination: Body mass index is 36.9 kg/m.   General: Obese built, not in obvious distress, on nasal cannula oxygen HENT:   No scleral pallor or icterus noted. Oral mucosa is moist.  Chest:  Clear breath sounds. No crackles or wheezes.  CVS: S1 &S2 heard. No murmur.  Regular rate and rhythm. Abdomen: Soft, nontender, nondistended.  Bowel sounds are heard.   Extremities: No cyanosis, clubbing or edema.    Right lower extremity with dressings.  Distal pulses palpable. Psych: Alert, awake and oriented, normal mood CNS:  No cranial nerve deficits.   Skin: Warm and dry.  No rashes noted.  Data Reviewed:   CBC: Recent Labs  Lab 07/17/22 2112 07/18/22 0400 07/19/22 0249 07/19/22 1432 07/20/22 0351 07/21/22 0312  WBC 10.6* 7.0 7.8 8.9 9.5 7.0  NEUTROABS 8.8*  --   --   --   --   --   HGB 11.5* 10.8* 9.2* 9.0* 7.3* 7.1*  HCT 35.3* 33.5* 28.1* 26.9* 22.4* 21.6*  MCV 92.4 91.5 92.7 92.4 90.7 91.1  PLT 228 206 173 175 148* 150    Basic Metabolic Panel: Recent Labs  Lab 07/17/22 2112 07/18/22 0400 07/19/22 0249 07/19/22 1432 07/20/22 0351 07/21/22 0312  NA 140 138 138  --  137 137  K 3.4* 4.0 3.7  --  3.7 3.7  CL 107 104 106  --  106 104  CO2 24 24 27   --  26 27  GLUCOSE 132* 251* 80  --  127* 132*  BUN 23 22 21   --  17 13  CREATININE 1.16* 1.29* 1.19* 1.14* 1.12* 0.99  CALCIUM 8.5* 8.5* 8.3*  --  8.0* 8.0*  MG  --  1.8 1.8  --  1.8 1.6*  PHOS  --  4.4  --   --   --   --     Liver Function Tests: No results for input(s): "AST", "ALT", "ALKPHOS", "BILITOT", "PROT", "ALBUMIN" in the last 168 hours.   Radiology Studies: DG FEMUR PORT, MIN 2 VIEWS RIGHT  Result Date: 07/19/2022 CLINICAL DATA:  Follow-up femur fracture EXAM: RIGHT FEMUR PORTABLE 2 VIEW COMPARISON:   07/17/2022 FINDINGS: Status post surgical plate and multiple screw fixation of acute comminuted fracture involving the distal femoral shaft and extending to level of right knee prosthesis. Decreased fracture displacement and angulation. Residual long 17 mm displaced medial shaft fracture fragment with 2 most distal screw tips partially anchored within IMPRESSION: Status post ORIF of distal femoral shaft fracture with decreased displacement and angulation. Electronically Signed   By: Jasmine Pang M.D.   On: 07/19/2022 15:23   DG FEMUR, MIN 2 VIEWS RIGHT  Result Date: 07/19/2022 CLINICAL DATA:  Open reduction internal fixation EXAM: RIGHT FEMUR 2 VIEWS COMPARISON:  07/17/2022 FINDINGS:  Seven low resolution intraoperative spot views of the right femur. Total fluoroscopy time was 1 minutes 50 seconds, fluoroscopic dose of 15.43 mGy. The images demonstrate a right knee replacement. There is placement of surgical plate and multiple fixating screws across comminuted distal femoral periprosthetic fracture. IMPRESSION: Intraoperative fluoroscopic assistance provided during ORIF of distal femoral periprosthetic fracture. Electronically Signed   By: Jasmine Pang M.D.   On: 07/19/2022 15:20   DG C-Arm 1-60 Min-No Report  Result Date: 07/19/2022 Fluoroscopy was utilized by the requesting physician.  No radiographic interpretation.      LOS: 3 days    Joycelyn Das, MD [facility] Available via Epic secure chat 7am-7pm After these hours, please refer to coverage provider listed on [email or web address] 07/21/2022, 11:22 AM

## 2022-07-21 NOTE — Progress Notes (Addendum)
    2 Days Post-Op Procedure(s) (LRB): OPEN REDUCTION INTERNAL FIXATION (ORIF) DISTAL FEMUR FRACTURE (Right)  Subjective:  Patient reports pain as moderate, though somewhat improved.  Slept a little better than previous night.  Still TDWB.  Mobilized with PT/OT yesterday.  Denies numbness or tingling.  Denies lightheadedness, dizziness, chest pain, shortness of breath.  No other complaints.  Anticipate DC to SNF.  Hgb 7.1 this AM.  Lovenox held.  Objective:   VITALS:   Vitals:   07/20/22 1814 07/20/22 2116 07/21/22 0020 07/21/22 0437  BP: (!) 175/40 (!) 165/70  (!) 155/55  Pulse: 85 90 85 90  Resp: 17 16 15 13   Temp: 98.4 F (36.9 C) 98.6 F (37 C)  98.4 F (36.9 C)  TempSrc: Oral Oral    SpO2: 100% 99% 99% 100%  Weight:      Height:        AAOx4, sitting comfortably ABD soft Neurovascular intact Sensation intact distally Intact pulses distally Gentle Dorsiflexion/Plantar flexion intact Incision: dressing C/D/I Mild-moderate swelling of RLE, mainly around knee Compartment soft Wiggles toes appropriately   Lab Results  Component Value Date   WBC 7.0 07/21/2022   HGB 7.1 (L) 07/21/2022   HCT 21.6 (L) 07/21/2022   MCV 91.1 07/21/2022   PLT 150 07/21/2022   BMET    Component Value Date/Time   NA 137 07/21/2022 0312   K 3.7 07/21/2022 0312   CL 104 07/21/2022 0312   CO2 27 07/21/2022 0312   GLUCOSE 132 (H) 07/21/2022 0312   BUN 13 07/21/2022 0312   CREATININE 0.99 07/21/2022 0312   CALCIUM 8.0 (L) 07/21/2022 0312   GFRNONAA 59 (L) 07/21/2022 0312     Xray: stable post-operative imaging  Assessment/Plan: 2 Days Post-Op   Principal Problem:   Femur fracture, right (HCC) Active Problems:   History of gastric bypass   Personal history of diabetes mellitus   OSA (obstructive sleep apnea)   Obesity   History of total left hip replacement   CKD (chronic kidney disease) stage 3, GFR 30-59 ml/min (HCC)   - Advance diet - Continue with PT/OT  -  Incentive Spirometry - Elevate and Apply ice - Hgb dropped to 7.1 this AM, has been downtrending.  Lovenox held.  Monitor closely for next 2-3 days, look to restart when Hgb stabilizes.  - Dressing changed, mepilex clean and dry, low suspicion for hematoma --> medicine aware    Post op recs: WB: TDWB of RLE Abx: ancef x23 hours post op Imaging: PACU xrays Dressing: keep intact until follow up, change PRN if soiled or saturated. DVT prophylaxis: Lovenox in house, DC on oral DOAC Follow up: 2 weeks after surgery for a wound check with Dr. Jena Gauss DC: per medicine, likely SNF   Jodi [name] 07/21/2022, 7:49 AM   Weber Cooks, MD  Contact information:   [phone] 7am-5pm epic message Dr. Blanchie Dessert, or call office for patient follow up: [phone] After hours and holidays please check [email or web address] for group call information for [facility]

## 2022-07-21 NOTE — Progress Notes (Signed)
   07/21/22 2309  BiPAP/CPAP/SIPAP  Reason BIPAP/CPAP not in use Non-compliant   Pt refused CPAP for night time use.

## 2022-07-22 ENCOUNTER — Encounter (HOSPITAL_COMMUNITY): Payer: Self-pay | Admitting: Student

## 2022-07-22 DIAGNOSIS — Z9884 Bariatric surgery status: Secondary | ICD-10-CM | POA: Diagnosis not present

## 2022-07-22 DIAGNOSIS — Z96642 Presence of left artificial hip joint: Secondary | ICD-10-CM | POA: Diagnosis not present

## 2022-07-22 DIAGNOSIS — N1831 Chronic kidney disease, stage 3a: Secondary | ICD-10-CM | POA: Diagnosis not present

## 2022-07-22 DIAGNOSIS — S7291XK Unspecified fracture of right femur, subsequent encounter for closed fracture with nonunion: Secondary | ICD-10-CM | POA: Diagnosis not present

## 2022-07-22 LAB — TYPE AND SCREEN
ABO/RH(D): A POS
Unit division: 0

## 2022-07-22 LAB — CBC
HCT: 21.8 % — ABNORMAL LOW (ref 36.0–46.0)
Hemoglobin: 7 g/dL — ABNORMAL LOW (ref 12.0–15.0)
MCH: 29.8 pg (ref 26.0–34.0)
MCHC: 32.1 g/dL (ref 30.0–36.0)
MCV: 92.8 fL (ref 80.0–100.0)
Platelets: 190 10*3/uL (ref 150–400)
RBC: 2.35 MIL/uL — ABNORMAL LOW (ref 3.87–5.11)
RDW: 13.2 % (ref 11.5–15.5)
WBC: 7.1 10*3/uL (ref 4.0–10.5)
nRBC: 0 % (ref 0.0–0.2)

## 2022-07-22 LAB — BASIC METABOLIC PANEL
Anion gap: 6 (ref 5–15)
BUN: 16 mg/dL (ref 8–23)
CO2: 30 mmol/L (ref 22–32)
Calcium: 8 mg/dL — ABNORMAL LOW (ref 8.9–10.3)
Chloride: 101 mmol/L (ref 98–111)
Creatinine, Ser: 0.94 mg/dL (ref 0.44–1.00)
GFR, Estimated: >60 mL/min (ref >60)
Glucose, Bld: 125 mg/dL — ABNORMAL HIGH (ref 70–99)
Potassium: 4 mmol/L (ref 3.5–5.1)
Sodium: 137 mmol/L (ref 135–145)

## 2022-07-22 LAB — GLUCOSE, CAPILLARY
Glucose-Capillary: 100 mg/dL — ABNORMAL HIGH (ref 70–99)
Glucose-Capillary: 106 mg/dL — ABNORMAL HIGH (ref 70–99)
Glucose-Capillary: 112 mg/dL — ABNORMAL HIGH (ref 70–99)
Glucose-Capillary: 115 mg/dL — ABNORMAL HIGH (ref 70–99)
Glucose-Capillary: 153 mg/dL — ABNORMAL HIGH (ref 70–99)

## 2022-07-22 LAB — PREPARE RBC (CROSSMATCH)

## 2022-07-22 LAB — BPAM RBC: Blood Product Expiration Date: 202407202359

## 2022-07-22 LAB — MAGNESIUM: Magnesium: 1.6 mg/dL — ABNORMAL LOW (ref 1.7–2.4)

## 2022-07-22 MED ORDER — MAGNESIUM OXIDE -MG SUPPLEMENT 400 (240 MG) MG PO TABS
400.0000 mg | ORAL_TABLET | Freq: Two times a day (BID) | ORAL | Status: DC
  Administered 2022-07-22 – 2022-07-23 (×3): 400 mg via ORAL
  Filled 2022-07-22 (×3): qty 1

## 2022-07-22 MED ORDER — SODIUM CHLORIDE 0.9% IV SOLUTION: Freq: Once | INTRAVENOUS | Status: AC

## 2022-07-22 MED ORDER — FLUTICASONE PROPIONATE 50 MCG/ACT NA SUSP
1.0000 | Freq: Every day | NASAL | Status: DC
  Administered 2022-07-22 – 2022-07-23 (×2): 1 via NASAL
  Filled 2022-07-22: qty 16

## 2022-07-22 MED ORDER — MAGNESIUM SULFATE 2 GM/50ML IV SOLN
2.0000 g | Freq: Once | INTRAVENOUS | Status: AC
  Administered 2022-07-22: 2 g via INTRAVENOUS
  Filled 2022-07-22: qty 50

## 2022-07-22 NOTE — Progress Notes (Addendum)
PROGRESS NOTE    SHALIYAH TAITE  ZOX:[id number] DOB: 1945-06-16 DOA: 07/18/2022 PCP: Sherlene Shams, MD    Brief Narrative:   Jodi [name] is a 77 y.o. female with medical history significant for right total knee replacement, type 2 diabetes, hypertension, obesity, OSA on CPAP, CKD 3B, who had initially presented to [facility] ED with complaints of a fall and right knee pain.  She tripped while trying to put a leash on a dog and landed on her right knee.  In the ED, right knee x-ray revealed acute comminuted fracture of the distal femur extending to the level of the arthroplasty.  EDP discussed the case with orthopedic surgery at [facility] who felt patient would benefit from Ortho trauma evaluation.  The patient initially requested [facility] where she previously had her knee replacement, but they did not have any beds and could not accept her.  EDP also discussed the case with orthopedic surgery at [facility] Dr. Victorino Dike, who recommended transfer to [facility] and admission by the hospitalist service.  In the ED, patient was hypersomnolent likely from narcotics and was put on CPAP.  Patient was afebrile with stable vitals.  Patient had hyperglycemia with glucose of 251,  creatinine of 1.29 and hemoglobin was 10.8.  Patient was then admitted to the hospital for further evaluation and treatment.  After hospitalization, patient underwent arthroplasty of the right distal femur.  Currently awaiting for skilled nursing facility placement.   Assessment and Plan.  Acute comminuted fracture of the right distal femur extending to the level of the arthroplasty  Patient was seen by orthopedics and underwent open reduction and internal fixation of right supracondylar distal femur fracture on 07/19/2022.  Touchdown weightbearing of the right lower extremity.  Plan for oral DOAC on discharge for DVT prophylaxis with outpatient follow-up in 2 weeks for wound check with Dr. Jena Gauss.  Patient has been seen by  physical therapy at this time and recommended skilled nursing facility placement.   Acute toxic encephalopathy, hypersomnolence, suspect side effect from narcotics  Resolved now.  Was initially on CPAP currently on room air.  Patient does have history of obstructive sleep apnea and is supposed to be on CPAP.     History of type 2 diabetes with hyperglycemia  Hemoglobin A1c of 5.3 at this time.  Not on medications at home.  Status post gastric bypass.  Continue sliding scale insulin as needed   CKD 3b Creatinine on presentation at 1.1.  Likely at baseline.  Latest creatinine at 0.9  Obesity Body mass index is 36.44 kg/m. Would recommend outpatient follow-up for weight loss management.  History of obstructive sleep apnea.  On CPAP.  Advised compliance with this.  Anemia.  Likely acute blood loss anemia.  Initial hemoglobin of 11.5.  Hemoglobin of 7.0 today.  No overt bleeding or hematoma reported.  Hemoglobin down trending since surgery.  Spoke with orthopedic surgery yesterday with no concerns for hematoma  but documented 100 mL blood loss during surgery.  Patient does have history of CKD and likely postoperative situation with hemodilution contributing to anemia.  Spoke with the patient about it.  Will transfuse 1 unit of packed RBC today.  Check CBC in AM.  Hold off with anticoagulation.  Mild hypomagnesemia.  Will replace orally.  Magnesium of 1.6 today.   DVT prophylaxis: SCDs Start: 07/19/22 1248 SCDs Start: 07/18/22 0319   Code Status:     Code Status: Full Code  Disposition:  Skilled nursing facility  as per PT evaluation.  Medically stable for disposition.  Status is: Inpatient  Remains inpatient appropriate because: Status post open reduction internal fixation, need for rehabilitation, PRBC transfusion.   Family Communication:  Spoke with the patient's daughter at bedside on 07/21/2022.  Consultants:  orthopedics  Procedures:  CPAP placement Open reduction and  internal fixation of distal femur fracture on 07/19/2022 PRBC transfusion 1 unit  Antimicrobials:  none  Subjective: Today, patient was seen and examined at bedside.  States that her pain is better.  Denies any dyspnea shortness of breath chest pain fever or chills.  Complains of some stuffiness in the nose.  Denies any external blood loss, melena  Objective: Vitals:   07/21/22 1910 07/21/22 2309 07/22/22 0523 07/22/22 0820  BP: (!) 138/52  118/64 119/65  Pulse: 92 (!) 110 77   Resp: 20 (!) 25 11   Temp: 98.5 F (36.9 C)  98.3 F (36.8 C) 98 F (36.7 C)  TempSrc:   Oral   SpO2: 100% 97% 100%   Weight:      Height:        Intake/Output Summary (Last 24 hours) at 07/22/2022 1554 Last data filed at 07/22/2022 1542 Gross per 24 hour  Intake 360 ml  Output 2600 ml  Net -2240 ml   Filed Weights   07/18/22 0423 07/19/22 0842  Weight: 96.3 kg 97.5 kg    Physical Examination: Body mass index is 36.9 kg/m.   General: Obese built, not in obvious distress, on nasal cannula oxygen HENT:   Mild pallor noted.  Oral mucosa is moist.  Chest: Decreased breath sounds bilaterally. No crackles or wheezes.  CVS: S1 &S2 heard. No murmur.  Regular rate and rhythm. Abdomen: Soft, nontender, nondistended.  Bowel sounds are heard.   Extremities: No cyanosis, clubbing or edema.    Right lower extremity with dressing, status post surgery. Psych: Alert, awake and oriented, normal mood CNS:  No cranial nerve deficits.  Moves extremities. Skin: Warm and dry.  No rashes noted.  Data Reviewed:   CBC: Recent Labs  Lab 07/17/22 2112 07/18/22 0400 07/19/22 0249 07/19/22 1432 07/20/22 0351 07/21/22 0312 07/22/22 0447  WBC 10.6*   < > 7.8 8.9 9.5 7.0 7.1  NEUTROABS 8.8*  --   --   --   --   --   --   HGB 11.5*   < > 9.2* 9.0* 7.3* 7.1* 7.0*  HCT 35.3*   < > 28.1* 26.9* 22.4* 21.6* 21.8*  MCV 92.4   < > 92.7 92.4 90.7 91.1 92.8  PLT 228   < > 173 175 148* 150 190   < > = values in this  interval not displayed.    Basic Metabolic Panel: Recent Labs  Lab 07/18/22 0400 07/19/22 0249 07/19/22 1432 07/20/22 0351 07/21/22 0312 07/22/22 0447  NA 138 138  --  137 137 137  K 4.0 3.7  --  3.7 3.7 4.0  CL 104 106  --  106 104 101  CO2 24 27  --  26 27 30   GLUCOSE 251* 80  --  127* 132* 125*  BUN 22 21  --  17 13 16   CREATININE 1.29* 1.19* 1.14* 1.12* 0.99 0.94  CALCIUM 8.5* 8.3*  --  8.0* 8.0* 8.0*  MG 1.8 1.8  --  1.8 1.6* 1.6*  PHOS 4.4  --   --   --   --   --     Liver Function Tests: No results  for input(s): "AST", "ALT", "ALKPHOS", "BILITOT", "PROT", "ALBUMIN" in the last 168 hours.   Radiology Studies: No results found.    LOS: 4 days    Joycelyn Das, MD [facility] Available via Epic secure chat 7am-7pm After these hours, please refer to coverage provider listed on [email or web address] 07/22/2022, 3:54 PM

## 2022-07-22 NOTE — Plan of Care (Signed)
   Problem: Pain Managment: Goal: General experience of comfort will improve Outcome: Progressing   Problem: Safety: Goal: Ability to remain free from injury will improve Outcome: Progressing   

## 2022-07-22 NOTE — Care Management Important Message (Signed)
Important Message  Patient Details  Name: Jodi [name] MRN: [id number] Date of Birth: 1945/07/29   Medicare Important Message Given:  Yes     Sherilyn Banker 07/22/2022, 4:01 PM

## 2022-07-22 NOTE — TOC Progression Note (Addendum)
Transition of Care [facility]) - Progression Note    Patient Details  Name: Jodi [name] MRN: [id number] Date of Birth: 1945/08/16  Transition of Care [facility]) CM/SW Contact  Lorri Frederick, LCSW Phone Number: 07/22/2022, 11:49 AM  Clinical Narrative:  Bed offers provided to pt on medicare choice document.  Her daughter will be here shortly and pt would like to discuss with her.     1340: CSW spoke with pt and daughter Lurena Joiner.  They would like to accept SNF offer at [facility] in [place].  CSW LM with [facility] to confirm.   Auth request submitted in [place].   1435: TC [facility].  They can receive pt tomorrow.   Expected Discharge Plan: Skilled Nursing Facility Barriers to Discharge: Continued Medical Work up  Expected Discharge Plan and Services     Post Acute Care Choice: Skilled Nursing Facility Living arrangements for the past 2 months: Single Family Home                                       Social Determinants of Health (SDOH) Interventions SDOH Screenings   Food Insecurity: No Food Insecurity (07/18/2022)  Housing: Low Risk  (07/18/2022)  Transportation Needs: No Transportation Needs (07/18/2022)  Utilities: Not At Risk (07/18/2022)  Depression (PHQ2-9): Medium Risk (10/30/2021)  Financial Resource Strain: Low Risk  (10/30/2021)  Social Connections: Unknown (10/30/2021)  Stress: No Stress Concern Present (10/30/2021)  Tobacco Use: Low Risk  (07/19/2022)    Readmission Risk Interventions     No data to display

## 2022-07-23 DIAGNOSIS — Z743 Need for continuous supervision: Secondary | ICD-10-CM | POA: Diagnosis not present

## 2022-07-23 DIAGNOSIS — S7291XD Unspecified fracture of right femur, subsequent encounter for closed fracture with routine healing: Secondary | ICD-10-CM | POA: Diagnosis not present

## 2022-07-23 DIAGNOSIS — E1169 Type 2 diabetes mellitus with other specified complication: Secondary | ICD-10-CM | POA: Diagnosis not present

## 2022-07-23 DIAGNOSIS — Z96651 Presence of right artificial knee joint: Secondary | ICD-10-CM | POA: Diagnosis not present

## 2022-07-23 DIAGNOSIS — I129 Hypertensive chronic kidney disease with stage 1 through stage 4 chronic kidney disease, or unspecified chronic kidney disease: Secondary | ICD-10-CM | POA: Diagnosis not present

## 2022-07-23 DIAGNOSIS — F411 Generalized anxiety disorder: Secondary | ICD-10-CM | POA: Diagnosis not present

## 2022-07-23 DIAGNOSIS — W19XXXD Unspecified fall, subsequent encounter: Secondary | ICD-10-CM | POA: Diagnosis not present

## 2022-07-23 DIAGNOSIS — R509 Fever, unspecified: Secondary | ICD-10-CM | POA: Diagnosis not present

## 2022-07-23 DIAGNOSIS — Z6831 Body mass index (BMI) 31.0-31.9, adult: Secondary | ICD-10-CM | POA: Diagnosis not present

## 2022-07-23 DIAGNOSIS — E669 Obesity, unspecified: Secondary | ICD-10-CM | POA: Diagnosis not present

## 2022-07-23 DIAGNOSIS — Z9181 History of falling: Secondary | ICD-10-CM | POA: Diagnosis not present

## 2022-07-23 DIAGNOSIS — Z9884 Bariatric surgery status: Secondary | ICD-10-CM | POA: Diagnosis not present

## 2022-07-23 DIAGNOSIS — Z7901 Long term (current) use of anticoagulants: Secondary | ICD-10-CM | POA: Diagnosis not present

## 2022-07-23 DIAGNOSIS — F29 Unspecified psychosis not due to a substance or known physiological condition: Secondary | ICD-10-CM | POA: Diagnosis not present

## 2022-07-23 DIAGNOSIS — D62 Acute posthemorrhagic anemia: Secondary | ICD-10-CM | POA: Diagnosis not present

## 2022-07-23 DIAGNOSIS — S72451D Displaced supracondylar fracture without intracondylar extension of lower end of right femur, subsequent encounter for closed fracture with routine healing: Secondary | ICD-10-CM | POA: Diagnosis not present

## 2022-07-23 DIAGNOSIS — I10 Essential (primary) hypertension: Secondary | ICD-10-CM | POA: Diagnosis not present

## 2022-07-23 DIAGNOSIS — F331 Major depressive disorder, recurrent, moderate: Secondary | ICD-10-CM | POA: Diagnosis not present

## 2022-07-23 DIAGNOSIS — Z741 Need for assistance with personal care: Secondary | ICD-10-CM | POA: Diagnosis not present

## 2022-07-23 DIAGNOSIS — R2689 Other abnormalities of gait and mobility: Secondary | ICD-10-CM | POA: Diagnosis not present

## 2022-07-23 DIAGNOSIS — S7291XK Unspecified fracture of right femur, subsequent encounter for closed fracture with nonunion: Secondary | ICD-10-CM | POA: Diagnosis not present

## 2022-07-23 DIAGNOSIS — E785 Hyperlipidemia, unspecified: Secondary | ICD-10-CM | POA: Diagnosis not present

## 2022-07-23 DIAGNOSIS — R1311 Dysphagia, oral phase: Secondary | ICD-10-CM | POA: Diagnosis not present

## 2022-07-23 DIAGNOSIS — M6281 Muscle weakness (generalized): Secondary | ICD-10-CM | POA: Diagnosis not present

## 2022-07-23 DIAGNOSIS — G4733 Obstructive sleep apnea (adult) (pediatric): Secondary | ICD-10-CM | POA: Diagnosis not present

## 2022-07-23 DIAGNOSIS — R2681 Unsteadiness on feet: Secondary | ICD-10-CM | POA: Diagnosis not present

## 2022-07-23 DIAGNOSIS — N182 Chronic kidney disease, stage 2 (mild): Secondary | ICD-10-CM | POA: Diagnosis not present

## 2022-07-23 DIAGNOSIS — R748 Abnormal levels of other serum enzymes: Secondary | ICD-10-CM | POA: Diagnosis not present

## 2022-07-23 DIAGNOSIS — R262 Difficulty in walking, not elsewhere classified: Secondary | ICD-10-CM | POA: Diagnosis not present

## 2022-07-23 DIAGNOSIS — N189 Chronic kidney disease, unspecified: Secondary | ICD-10-CM | POA: Diagnosis not present

## 2022-07-23 DIAGNOSIS — M9711XD Periprosthetic fracture around internal prosthetic right knee joint, subsequent encounter: Secondary | ICD-10-CM | POA: Diagnosis not present

## 2022-07-23 LAB — CBC
HCT: 24.7 % — ABNORMAL LOW (ref 36.0–46.0)
Hemoglobin: 8.1 g/dL — ABNORMAL LOW (ref 12.0–15.0)
MCH: 29.7 pg (ref 26.0–34.0)
MCHC: 32.8 g/dL (ref 30.0–36.0)
MCV: 90.5 fL (ref 80.0–100.0)
Platelets: 221 10*3/uL (ref 150–400)
RBC: 2.73 MIL/uL — ABNORMAL LOW (ref 3.87–5.11)
RDW: 14.8 % (ref 11.5–15.5)
WBC: 7.5 10*3/uL (ref 4.0–10.5)
nRBC: 0 % (ref 0.0–0.2)

## 2022-07-23 LAB — BASIC METABOLIC PANEL
Anion gap: 10 (ref 5–15)
BUN: 21 mg/dL (ref 8–23)
CO2: 29 mmol/L (ref 22–32)
Calcium: 8.2 mg/dL — ABNORMAL LOW (ref 8.9–10.3)
Chloride: 100 mmol/L (ref 98–111)
Creatinine, Ser: 0.79 mg/dL (ref 0.44–1.00)
GFR, Estimated: >60 mL/min (ref >60)
Glucose, Bld: 133 mg/dL — ABNORMAL HIGH (ref 70–99)
Potassium: 4.2 mmol/L (ref 3.5–5.1)
Sodium: 139 mmol/L (ref 135–145)

## 2022-07-23 LAB — GLUCOSE, CAPILLARY
Glucose-Capillary: 110 mg/dL — ABNORMAL HIGH (ref 70–99)
Glucose-Capillary: 130 mg/dL — ABNORMAL HIGH (ref 70–99)

## 2022-07-23 LAB — BPAM RBC
ISSUE DATE / TIME: 202407151712
Unit Type and Rh: 6200

## 2022-07-23 LAB — MAGNESIUM: Magnesium: 2.1 mg/dL (ref 1.7–2.4)

## 2022-07-23 LAB — TYPE AND SCREEN: Antibody Screen: NEGATIVE

## 2022-07-23 MED ORDER — POLYETHYLENE GLYCOL 3350 17 G PO PACK: 17.0000 g | PACK | Freq: Every day | ORAL | Status: AC | PRN

## 2022-07-23 MED ORDER — CLONAZEPAM 0.5 MG PO TABS: 0.5000 mg | ORAL_TABLET | Freq: Every evening | ORAL | 0 refills | Status: DC | PRN

## 2022-07-23 MED ORDER — DOCUSATE SODIUM 100 MG PO CAPS: 100.0000 mg | ORAL_CAPSULE | Freq: Two times a day (BID) | ORAL | Status: AC

## 2022-07-23 MED ORDER — AMITRIPTYLINE HCL 50 MG PO TABS: ORAL_TABLET | ORAL | 0 refills | Status: DC

## 2022-07-23 MED ORDER — MAGNESIUM OXIDE -MG SUPPLEMENT 400 (240 MG) MG PO TABS: 400.0000 mg | ORAL_TABLET | Freq: Every day | ORAL | Status: AC

## 2022-07-23 MED ORDER — TRAZODONE HCL 100 MG PO TABS: 200.0000 mg | ORAL_TABLET | Freq: Every evening | ORAL | 0 refills | Status: DC | PRN

## 2022-07-23 NOTE — TOC Transition Note (Signed)
Transition of Care [facility]) - CM/SW Discharge Note   Patient Details  Name: Jodi [name] MRN: [id number] Date of Birth: 01/24/1945  Transition of Care [facility]) CM/SW Contact:  Lorri Frederick, LCSW Phone Number: 07/23/2022, 10:51 AM   Clinical Narrative:  [facility].  RN call report to [phone].       Final next level of care: Skilled Nursing Facility Barriers to Discharge: Barriers Resolved   Patient Goals and CMS Choice CMS Medicare.gov Compare Post Acute Care list provided to:: Patient Choice offered to / list presented to : Patient, Adult Children  Discharge Placement                Patient chooses bed at:  ([facility]) Patient to be transferred to facility by: [facility] Name of family member notified: [name] Patient and family notified of of transfer: 07/23/22  Discharge Plan and Services Additional resources added to the After Visit Summary for       Post Acute Care Choice: Skilled Nursing Facility                               Social Determinants of Health (SDOH) Interventions SDOH Screenings   Food Insecurity: No Food Insecurity (07/18/2022)  Housing: Low Risk  (07/18/2022)  Transportation Needs: No Transportation Needs (07/18/2022)  Utilities: Not At Risk (07/18/2022)  Depression (PHQ2-9): Medium Risk (10/30/2021)  Financial Resource Strain: Low Risk  (10/30/2021)  Social Connections: Unknown (10/30/2021)  Stress: No Stress Concern Present (10/30/2021)  Tobacco Use: Low Risk  (07/19/2022)     Readmission Risk Interventions     No data to display

## 2022-07-23 NOTE — Discharge Summary (Signed)
[facility]  Physician Discharge Summary   Patient ID: Jodi [name] MRN: [id number] DOB/AGE: 11-Nov-1945 77 y.o.  Admit date: 07/18/2022 Discharge date:   07/23/2022   PCP: Sherlene Shams, MD  DISCHARGE DIAGNOSES:    Femur fracture, right [facility])   History of gastric bypass   Personal history of diabetes mellitus   OSA (obstructive sleep apnea)   Obesity   History of total left hip replacement   CKD (chronic kidney disease) stage 3, GFR 30-59 ml/min (HCC) Acute blood loss anemia   RECOMMENDATIONS FOR OUTPATIENT FOLLOW UP: Please check CBC and basic metabolic panel in 3 to 4 days. Follow-up with orthopedics as instructed by them  (include homehealth, outpatient follow-up instructions, specific recommendations for PCP to follow-up on, etc.)   Home Health: Going to SNF Equipment/Devices: None  CODE STATUS: Full code  DISCHARGE CONDITION: fair  Diet recommendation: Regular as tolerated.  INITIAL HISTORY: 77 y.o. female with medical history significant for right total knee replacement, type 2 diabetes, hypertension, obesity, OSA on CPAP, CKD 3B, who had initially presented to [facility] ED with complaints of a fall and right knee pain.  She tripped while trying to put a leash on a dog and landed on her right knee.  In the ED, right knee x-ray revealed acute comminuted fracture of the distal femur extending to the level of the arthroplasty.  EDP discussed the case with orthopedic surgery at [facility] who felt patient would benefit from Ortho trauma evaluation.  The patient initially requested [facility] where she previously had her knee replacement, but they did not have any beds and could not accept her.  EDP also discussed the case with orthopedic surgery at [facility] Dr. Victorino Dike, who recommended transfer to [facility] and admission by the hospitalist service.  In the ED, patient was hypersomnolent likely from narcotics and was put on CPAP.  Patient was afebrile with  stable vitals.  Patient had hyperglycemia with glucose of 251,  creatinine of 1.29 and hemoglobin was 10.8.  Patient was then admitted to the hospital for further evaluation and treatment.   After hospitalization, patient underwent arthroplasty of the right distal femur.  Currently awaiting for skilled nursing facility placement.   HOSPITAL COURSE:   Acute comminuted fracture of the right distal femur extending to the level of the arthroplasty Patient was seen by orthopedics and underwent open reduction and internal fixation of right supracondylar distal femur fracture on 07/19/2022.  Touchdown weightbearing of the right lower extremity.  Plan for oral DOAC on discharge for DVT prophylaxis with outpatient follow-up in 2 weeks for wound check with Dr. Jena Gauss.  Patient has been seen by physical therapy at this time and recommended skilled nursing facility placement.   Acute toxic encephalopathy, hypersomnolence, suspect side effect from narcotics Resolved now.  Was initially on CPAP currently on room air.  Patient does have history of obstructive sleep apnea and is supposed to be on CPAP.     History of type 2 diabetes with hyperglycemia Hemoglobin A1c of 5.3 at this time.  Not on medications at home.  Status post gastric bypass.     CKD 3b Creatinine on presentation at 1.1.  Likely at baseline.   Obesity Estimated body mass index is 36.9 kg/m as calculated from the following:   Height as of this encounter: 5\' 4"  (1.626 m).   Weight as of this encounter: 97.5 kg.   History of obstructive sleep apnea.  On CPAP.  Advised compliance with this.  Acute blood loss Anemia.   Patient experienced a drop in hemoglobin after fracture as well as surgery.  Was transfused with 1 unit of PRBC.  Hemoglobin improved from 7.0 to 8.1.     Mild hypomagnesemia.  On magnesium oxide  Patient is stable.  Okay for discharge to SNF.  PERTINENT LABS:  The results of significant diagnostics from this  hospitalization (including imaging, microbiology, ancillary and laboratory) are listed below for reference.    Microbiology: Recent Results (from the past 240 hour(s))  Surgical pcr screen     Status: None   Collection Time: 07/18/22  4:40 AM   Specimen: Nasal Mucosa; Nasal Swab  Result Value Ref Range Status   MRSA, PCR NEGATIVE NEGATIVE Final   Staphylococcus aureus NEGATIVE NEGATIVE Final    Comment: (NOTE) The Xpert SA Assay (FDA approved for NASAL specimens in patients 76 years of age and older), is one component of a comprehensive surveillance program. It is not intended to diagnose infection nor to guide or monitor treatment. Performed at [facility], [address] [place], [place] [address]      Labs:   Basic Metabolic Panel: Recent Labs  Lab 07/18/22 0400 07/19/22 0249 07/19/22 1432 07/20/22 0351 07/21/22 0312 07/22/22 0447 07/23/22 0307  NA 138 138  --  137 137 137 139  K 4.0 3.7  --  3.7 3.7 4.0 4.2  CL 104 106  --  106 104 101 100  CO2 24 27  --  26 27 30 29   GLUCOSE 251* 80  --  127* 132* 125* 133*  BUN 22 21  --  17 13 16 21   CREATININE 1.29* 1.19* 1.14* 1.12* 0.99 0.94 0.79  CALCIUM 8.5* 8.3*  --  8.0* 8.0* 8.0* 8.2*  MG 1.8 1.8  --  1.8 1.6* 1.6* 2.1  PHOS 4.4  --   --   --   --   --   --     CBC: Recent Labs  Lab 07/17/22 2112 07/18/22 0400 07/19/22 1432 07/20/22 0351 07/21/22 0312 07/22/22 0447 07/23/22 0307  WBC 10.6*   < > 8.9 9.5 7.0 7.1 7.5  NEUTROABS 8.8*  --   --   --   --   --   --   HGB 11.5*   < > 9.0* 7.3* 7.1* 7.0* 8.1*  HCT 35.3*   < > 26.9* 22.4* 21.6* 21.8* 24.7*  MCV 92.4   < > 92.4 90.7 91.1 92.8 90.5  PLT 228   < > 175 148* 150 190 221   < > = values in this interval not displayed.     CBG: Recent Labs  Lab 07/22/22 0520 07/22/22 0720 07/22/22 1603 07/22/22 2003 07/23/22 0738  GLUCAP 112* 100* 153* 115* 110*     IMAGING STUDIES DG FEMUR PORT, MIN 2 VIEWS RIGHT  Result Date: 07/19/2022 CLINICAL  DATA:  Follow-up femur fracture EXAM: RIGHT FEMUR PORTABLE 2 VIEW COMPARISON:  07/17/2022 FINDINGS: Status post surgical plate and multiple screw fixation of acute comminuted fracture involving the distal femoral shaft and extending to level of right knee prosthesis. Decreased fracture displacement and angulation. Residual long 17 mm displaced medial shaft fracture fragment with 2 most distal screw tips partially anchored within IMPRESSION: Status post ORIF of distal femoral shaft fracture with decreased displacement and angulation. Electronically Signed   By: Jasmine Pang M.D.   On: 07/19/2022 15:23   DG FEMUR, MIN 2 VIEWS RIGHT  Result Date: 07/19/2022 CLINICAL DATA:  Open reduction internal fixation EXAM: RIGHT FEMUR 2 VIEWS COMPARISON:  07/17/2022 FINDINGS: Seven low resolution intraoperative spot views of the right femur. Total fluoroscopy time was 1 minutes 50 seconds, fluoroscopic dose of 15.43 mGy. The images demonstrate a right knee replacement. There is placement of surgical plate and multiple fixating screws across comminuted distal femoral periprosthetic fracture. IMPRESSION: Intraoperative fluoroscopic assistance provided during ORIF of distal femoral periprosthetic fracture. Electronically Signed   By: Jasmine Pang M.D.   On: 07/19/2022 15:20   DG C-Arm 1-60 Min-No Report  Result Date: 07/19/2022 Fluoroscopy was utilized by the requesting physician.  No radiographic interpretation.   DG Chest Portable 1 View  Result Date: 07/17/2022 CLINICAL DATA:  Femur fracture after fall.  Preop. EXAM: PORTABLE CHEST 1 VIEW COMPARISON:  None Available. FINDINGS: The heart is normal in size for technique.The cardiomediastinal contours are normal. The lungs are clear. Pulmonary vasculature is normal. No consolidation, pleural effusion, or pneumothorax. No acute osseous abnormalities are seen. IMPRESSION: No acute chest findings. Electronically Signed   By: Narda Rutherford M.D.   On: 07/17/2022 21:11    DG Hip Unilat W or Wo Pelvis 2-3 Views Right  Result Date: 07/17/2022 CLINICAL DATA:  Right-sided hip and knee pain after fall. EXAM: DG HIP (WITH OR WITHOUT PELVIS) 2-3V RIGHT COMPARISON:  None Available. FINDINGS: Distal femur fracture is partially included in the field of view. No fracture of the proximal femur or hip. No pelvic fracture. Previous left hip arthroplasty. Bladder tack. Pre sacral stimulator. The pubic rami are intact. Pubic symphysis and sacroiliac joints are congruent. IMPRESSION: No fracture of the proximal femur, hip or pelvis. Distal femur fracture is partially included in the field of view. Electronically Signed   By: Narda Rutherford M.D.   On: 07/17/2022 21:07   DG Knee Complete 4 Views Right  Result Date: 07/17/2022 CLINICAL DATA:  Fall EXAM: RIGHT KNEE - COMPLETE 4+ VIEW COMPARISON:  None Available. FINDINGS: Right knee total arthroplasty is present. There is an acute comminuted fracture of the distal femur extending to the level of the arthroplasty. There is 1 shaft with medial and posterior displacement of the distal fracture fragment with apex lateral angulation. There is surrounding soft tissue swelling. There is no dislocation. IMPRESSION: Acute comminuted fracture of the distal femur extending to the level of the arthroplasty. Electronically Signed   By: Darliss Cheney M.D.   On: 07/17/2022 21:06    DISCHARGE EXAMINATION: Vitals:   07/22/22 2007 07/23/22 0230 07/23/22 0400 07/23/22 0743  BP: 138/83 (!) 149/65 (!) 142/76 (!) 104/40  Pulse: 95 90 88 84  Resp: 14 16 12 15   Temp: 99 F (37.2 C) 99.2 F (37.3 C)  98.1 F (36.7 C)  TempSrc: Oral Oral    SpO2: 98% 95% 96% 100%  Weight:      Height:       General appearance: Awake alert.  In no distress Resp: Clear to auscultation bilaterally.  Normal effort Cardio: S1-S2 is normal regular.  No S3-S4.  No rubs murmurs or bruit GI: Abdomen is soft.  Nontender nondistended.  Bowel sounds are present normal.  No  masses organomegaly   DISPOSITION: SNF  Discharge Instructions     Call MD for:  difficulty breathing, headache or visual disturbances   Complete by: As directed    Call MD for:  extreme fatigue   Complete by: As directed    Call MD for:  persistant dizziness or light-headedness   Complete by: As directed  Call MD for:  persistant nausea and vomiting   Complete by: As directed    Call MD for:  severe uncontrolled pain   Complete by: As directed    Call MD for:  temperature >100.4   Complete by: As directed    Discharge instructions   Complete by: As directed    Please review instructions on the discharge summary.  You were cared for by a hospitalist during your hospital stay. If you have any questions about your discharge medications or the care you received while you were in the hospital after you are discharged, you can call the unit and asked to speak with the hospitalist on call if the hospitalist that took care of you is not available. Once you are discharged, your primary care physician will handle any further medical issues. Please note that NO REFILLS for any discharge medications will be authorized once you are discharged, as it is imperative that you return to your primary care physician (or establish a relationship with a primary care physician if you do not have one) for your aftercare needs so that they can reassess your need for medications and monitor your lab values. If you do not have a primary care physician, you can call [phone] for a physician referral.   Increase activity slowly   Complete by: As directed          Allergies as of 07/23/2022       Reactions   Morphine And Codeine Nausea And Vomiting        Medication List     TAKE these medications    acetaminophen 500 MG tablet Commonly known as: TYLENOL Take 1,000 mg by mouth 2 (two) times daily as needed for moderate pain, fever or headache.   amitriptyline 50 MG tablet Commonly known as:  ELAVIL TAKE 1 TABLET BY MOUTH EVERYDAY AT BEDTIME   apixaban 2.5 MG Tabs tablet Commonly known as: Eliquis Take 1 tablet (2.5 mg total) by mouth 2 (two) times daily.   atorvastatin 10 MG tablet Commonly known as: LIPITOR TAKE 1 TABLET BY MOUTH EVERY DAY What changed: when to take this   buPROPion 150 MG 12 hr tablet Commonly known as: WELLBUTRIN SR TAKE 1 TABLET BY MOUTH TWICE A DAY   Centrum Silver 50+Women Tabs Take 1 tablet by mouth daily.   clonazePAM 0.5 MG tablet Commonly known as: KLONOPIN Take 1 tablet (0.5 mg total) by mouth at bedtime as needed for anxiety. What changed: See the new instructions.   docusate sodium 100 MG capsule Commonly known as: COLACE Take 1 capsule (100 mg total) by mouth 2 (two) times daily.   gabapentin 100 MG capsule Commonly known as: NEURONTIN TAKE 1 CAPSULE BY MOUTH THREE TIMES A DAY   losartan-hydrochlorothiazide 50-12.5 MG tablet Commonly known as: HYZAAR TAKE 1 TABLET BY MOUTH EVERY DAY   magnesium oxide 400 (240 Mg) MG tablet Commonly known as: MAG-OX Take 1 tablet (400 mg total) by mouth daily.   oxyCODONE-acetaminophen 5-325 MG tablet Commonly known as: PERCOCET/ROXICET Take 1 tablet by mouth every 4 (four) hours as needed for moderate pain or severe pain.   polyethylene glycol 17 g packet Commonly known as: MIRALAX / GLYCOLAX Take 17 g by mouth daily as needed for mild constipation.   sertraline 100 MG tablet Commonly known as: ZOLOFT TAKE 1.5 TABLETS (150 MG TOTAL) BY MOUTH DAILY What changed:  how much to take how to take this when to take this additional instructions  traZODone 100 MG tablet Commonly known as: DESYREL Take 2 tablets (200 mg total) by mouth at bedtime as needed for sleep.          Follow-up Information     Haddix, Gillie Manners, MD. Schedule an appointment as soon as possible for a visit in 2 week(s).   Specialty: Orthopedic Surgery Why: for wound check and repeat x-rays Contact  information: [address] [place] [address] [phone]                 TOTAL DISCHARGE TIME: 35 minutes  Allon Costlow Rito Ehrlich  [facility] Pager on [email or web address]  07/23/2022, 9:00 AM

## 2022-07-23 NOTE — TOC Progression Note (Signed)
[facility]) - Progression Note    Patient Details  Name: Jodi [name] MRN: [id number] Date of Birth: 1945-07-22  [facility]) CM/SW Contact  Lorri Frederick, LCSW Phone Number: 07/23/2022, 8:46 AM  Clinical Narrative:   SNF auth approved in [place]: [id number], [id number], 3 days: 7/16-7/18.  MD informed.    Expected Discharge Plan: Skilled Nursing Facility Barriers to Discharge: Continued Medical Work up  Expected Discharge Plan and Services     Post Acute Care Choice: Skilled Nursing Facility Living arrangements for the past 2 months: Single Family Home                                       Social Determinants of Health (SDOH) Interventions SDOH Screenings   Food Insecurity: No Food Insecurity (07/18/2022)  Housing: Low Risk  (07/18/2022)  Transportation Needs: No Transportation Needs (07/18/2022)  Utilities: Not At Risk (07/18/2022)  Depression (PHQ2-9): Medium Risk (10/30/2021)  Financial Resource Strain: Low Risk  (10/30/2021)  Social Connections: Unknown (10/30/2021)  Stress: No Stress Concern Present (10/30/2021)  Tobacco Use: Low Risk  (07/19/2022)    Readmission Risk Interventions     No data to display

## 2022-07-23 NOTE — Progress Notes (Signed)
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

## 2022-07-24 DIAGNOSIS — S7291XD Unspecified fracture of right femur, subsequent encounter for closed fracture with routine healing: Secondary | ICD-10-CM | POA: Diagnosis not present

## 2022-07-25 DIAGNOSIS — I10 Essential (primary) hypertension: Secondary | ICD-10-CM | POA: Diagnosis not present

## 2022-07-25 DIAGNOSIS — S7291XD Unspecified fracture of right femur, subsequent encounter for closed fracture with routine healing: Secondary | ICD-10-CM | POA: Diagnosis not present

## 2022-07-25 LAB — GLUCOSE, CAPILLARY: Glucose-Capillary: 159 mg/dL — ABNORMAL HIGH (ref 70–99)

## 2022-07-26 DIAGNOSIS — S72451D Displaced supracondylar fracture without intracondylar extension of lower end of right femur, subsequent encounter for closed fracture with routine healing: Secondary | ICD-10-CM | POA: Diagnosis not present

## 2022-07-26 DIAGNOSIS — E1169 Type 2 diabetes mellitus with other specified complication: Secondary | ICD-10-CM | POA: Diagnosis not present

## 2022-07-26 DIAGNOSIS — I129 Hypertensive chronic kidney disease with stage 1 through stage 4 chronic kidney disease, or unspecified chronic kidney disease: Secondary | ICD-10-CM | POA: Diagnosis not present

## 2022-07-26 DIAGNOSIS — E785 Hyperlipidemia, unspecified: Secondary | ICD-10-CM | POA: Diagnosis not present

## 2022-07-26 DIAGNOSIS — M9711XD Periprosthetic fracture around internal prosthetic right knee joint, subsequent encounter: Secondary | ICD-10-CM | POA: Diagnosis not present

## 2022-07-26 DIAGNOSIS — D62 Acute posthemorrhagic anemia: Secondary | ICD-10-CM | POA: Diagnosis not present

## 2022-07-26 DIAGNOSIS — G4733 Obstructive sleep apnea (adult) (pediatric): Secondary | ICD-10-CM | POA: Diagnosis not present

## 2022-07-26 DIAGNOSIS — N182 Chronic kidney disease, stage 2 (mild): Secondary | ICD-10-CM | POA: Diagnosis not present

## 2022-07-26 DIAGNOSIS — F331 Major depressive disorder, recurrent, moderate: Secondary | ICD-10-CM | POA: Diagnosis not present

## 2022-07-27 DIAGNOSIS — S7291XD Unspecified fracture of right femur, subsequent encounter for closed fracture with routine healing: Secondary | ICD-10-CM | POA: Diagnosis not present

## 2022-07-29 DIAGNOSIS — Z7901 Long term (current) use of anticoagulants: Secondary | ICD-10-CM | POA: Diagnosis not present

## 2022-07-29 DIAGNOSIS — Z9181 History of falling: Secondary | ICD-10-CM | POA: Diagnosis not present

## 2022-07-29 DIAGNOSIS — M9711XD Periprosthetic fracture around internal prosthetic right knee joint, subsequent encounter: Secondary | ICD-10-CM | POA: Diagnosis not present

## 2022-07-29 DIAGNOSIS — S72451D Displaced supracondylar fracture without intracondylar extension of lower end of right femur, subsequent encounter for closed fracture with routine healing: Secondary | ICD-10-CM | POA: Diagnosis not present

## 2022-07-29 DIAGNOSIS — E1169 Type 2 diabetes mellitus with other specified complication: Secondary | ICD-10-CM | POA: Diagnosis not present

## 2022-07-29 DIAGNOSIS — D62 Acute posthemorrhagic anemia: Secondary | ICD-10-CM | POA: Diagnosis not present

## 2022-07-29 DIAGNOSIS — E669 Obesity, unspecified: Secondary | ICD-10-CM | POA: Diagnosis not present

## 2022-07-29 DIAGNOSIS — S7291XD Unspecified fracture of right femur, subsequent encounter for closed fracture with routine healing: Secondary | ICD-10-CM | POA: Diagnosis not present

## 2022-07-29 DIAGNOSIS — Z6831 Body mass index (BMI) 31.0-31.9, adult: Secondary | ICD-10-CM | POA: Diagnosis not present

## 2022-07-29 DIAGNOSIS — Z9884 Bariatric surgery status: Secondary | ICD-10-CM | POA: Diagnosis not present

## 2022-07-30 DIAGNOSIS — S7291XD Unspecified fracture of right femur, subsequent encounter for closed fracture with routine healing: Secondary | ICD-10-CM | POA: Diagnosis not present

## 2022-07-31 DIAGNOSIS — S7291XD Unspecified fracture of right femur, subsequent encounter for closed fracture with routine healing: Secondary | ICD-10-CM | POA: Diagnosis not present

## 2022-08-01 DIAGNOSIS — S7291XD Unspecified fracture of right femur, subsequent encounter for closed fracture with routine healing: Secondary | ICD-10-CM | POA: Diagnosis not present

## 2022-08-02 DIAGNOSIS — S7291XD Unspecified fracture of right femur, subsequent encounter for closed fracture with routine healing: Secondary | ICD-10-CM | POA: Diagnosis not present

## 2022-08-05 DIAGNOSIS — S7291XD Unspecified fracture of right femur, subsequent encounter for closed fracture with routine healing: Secondary | ICD-10-CM | POA: Diagnosis not present

## 2022-08-06 DIAGNOSIS — S7291XD Unspecified fracture of right femur, subsequent encounter for closed fracture with routine healing: Secondary | ICD-10-CM | POA: Diagnosis not present

## 2022-08-06 DIAGNOSIS — S72451D Displaced supracondylar fracture without intracondylar extension of lower end of right femur, subsequent encounter for closed fracture with routine healing: Secondary | ICD-10-CM | POA: Diagnosis not present

## 2022-08-07 DIAGNOSIS — S7291XD Unspecified fracture of right femur, subsequent encounter for closed fracture with routine healing: Secondary | ICD-10-CM | POA: Diagnosis not present

## 2022-08-09 DIAGNOSIS — D62 Acute posthemorrhagic anemia: Secondary | ICD-10-CM | POA: Diagnosis not present

## 2022-08-09 DIAGNOSIS — Z6831 Body mass index (BMI) 31.0-31.9, adult: Secondary | ICD-10-CM | POA: Diagnosis not present

## 2022-08-09 DIAGNOSIS — Z9181 History of falling: Secondary | ICD-10-CM | POA: Diagnosis not present

## 2022-08-09 DIAGNOSIS — Z7901 Long term (current) use of anticoagulants: Secondary | ICD-10-CM | POA: Diagnosis not present

## 2022-08-09 DIAGNOSIS — E1169 Type 2 diabetes mellitus with other specified complication: Secondary | ICD-10-CM | POA: Diagnosis not present

## 2022-08-09 DIAGNOSIS — S72451D Displaced supracondylar fracture without intracondylar extension of lower end of right femur, subsequent encounter for closed fracture with routine healing: Secondary | ICD-10-CM | POA: Diagnosis not present

## 2022-08-09 DIAGNOSIS — M9711XD Periprosthetic fracture around internal prosthetic right knee joint, subsequent encounter: Secondary | ICD-10-CM | POA: Diagnosis not present

## 2022-08-09 DIAGNOSIS — E669 Obesity, unspecified: Secondary | ICD-10-CM | POA: Diagnosis not present

## 2022-08-09 DIAGNOSIS — Z96651 Presence of right artificial knee joint: Secondary | ICD-10-CM | POA: Diagnosis not present

## 2022-08-12 DIAGNOSIS — E669 Obesity, unspecified: Secondary | ICD-10-CM | POA: Diagnosis not present

## 2022-08-12 DIAGNOSIS — E785 Hyperlipidemia, unspecified: Secondary | ICD-10-CM | POA: Diagnosis not present

## 2022-08-12 DIAGNOSIS — Z9181 History of falling: Secondary | ICD-10-CM | POA: Diagnosis not present

## 2022-08-12 DIAGNOSIS — I129 Hypertensive chronic kidney disease with stage 1 through stage 4 chronic kidney disease, or unspecified chronic kidney disease: Secondary | ICD-10-CM | POA: Diagnosis not present

## 2022-08-12 DIAGNOSIS — E1169 Type 2 diabetes mellitus with other specified complication: Secondary | ICD-10-CM | POA: Diagnosis not present

## 2022-08-12 DIAGNOSIS — M9711XD Periprosthetic fracture around internal prosthetic right knee joint, subsequent encounter: Secondary | ICD-10-CM | POA: Diagnosis not present

## 2022-08-12 DIAGNOSIS — D62 Acute posthemorrhagic anemia: Secondary | ICD-10-CM | POA: Diagnosis not present

## 2022-08-12 DIAGNOSIS — F411 Generalized anxiety disorder: Secondary | ICD-10-CM | POA: Diagnosis not present

## 2022-08-12 DIAGNOSIS — N189 Chronic kidney disease, unspecified: Secondary | ICD-10-CM | POA: Diagnosis not present

## 2022-08-14 ENCOUNTER — Telehealth: Payer: Self-pay | Admitting: Internal Medicine

## 2022-08-14 NOTE — Telephone Encounter (Signed)
Spoke with pt's daughter and scheduled an appt.

## 2022-08-14 NOTE — Telephone Encounter (Signed)
Pt daughter called stating pt need a rehab follow up appointment before physical therapy can come out. There are no appointments available

## 2022-08-20 ENCOUNTER — Other Ambulatory Visit: Payer: Self-pay | Admitting: Internal Medicine

## 2022-08-21 ENCOUNTER — Telehealth: Payer: Self-pay | Admitting: Internal Medicine

## 2022-08-21 ENCOUNTER — Encounter: Payer: Self-pay | Admitting: Internal Medicine

## 2022-08-21 ENCOUNTER — Ambulatory Visit (INDEPENDENT_AMBULATORY_CARE_PROVIDER_SITE_OTHER): Payer: Medicare HMO | Admitting: Internal Medicine

## 2022-08-21 VITALS — BP 122/60 | HR 84 | Ht 64.0 in | Wt 215.0 lb

## 2022-08-21 DIAGNOSIS — D62 Acute posthemorrhagic anemia: Secondary | ICD-10-CM

## 2022-08-21 DIAGNOSIS — G4733 Obstructive sleep apnea (adult) (pediatric): Secondary | ICD-10-CM

## 2022-08-21 DIAGNOSIS — F419 Anxiety disorder, unspecified: Secondary | ICD-10-CM | POA: Diagnosis not present

## 2022-08-21 DIAGNOSIS — Z8639 Personal history of other endocrine, nutritional and metabolic disease: Secondary | ICD-10-CM | POA: Diagnosis not present

## 2022-08-21 DIAGNOSIS — N1831 Chronic kidney disease, stage 3a: Secondary | ICD-10-CM

## 2022-08-21 DIAGNOSIS — S7291XK Unspecified fracture of right femur, subsequent encounter for closed fracture with nonunion: Secondary | ICD-10-CM | POA: Diagnosis not present

## 2022-08-21 LAB — CBC WITH DIFFERENTIAL/PLATELET
Basophils Absolute: 0 10*3/uL (ref 0.0–0.1)
Basophils Relative: 0.7 % (ref 0.0–3.0)
Eosinophils Absolute: 0.1 10*3/uL (ref 0.0–0.7)
Eosinophils Relative: 1.3 % (ref 0.0–5.0)
HCT: 36.6 % (ref 36.0–46.0)
Hemoglobin: 11.6 g/dL — ABNORMAL LOW (ref 12.0–15.0)
Lymphocytes Relative: 22.1 % (ref 12.0–46.0)
Lymphs Abs: 1.5 10*3/uL (ref 0.7–4.0)
MCHC: 31.8 g/dL (ref 30.0–36.0)
MCV: 91.1 fl (ref 78.0–100.0)
Monocytes Absolute: 0.7 10*3/uL (ref 0.1–1.0)
Monocytes Relative: 10.1 % (ref 3.0–12.0)
Neutro Abs: 4.4 10*3/uL (ref 1.4–7.7)
Neutrophils Relative %: 65.8 % (ref 43.0–77.0)
Platelets: 311 10*3/uL (ref 150.0–400.0)
RBC: 4.01 Mil/uL (ref 3.87–5.11)
RDW: 15.2 % (ref 11.5–15.5)
WBC: 6.7 10*3/uL (ref 4.0–10.5)

## 2022-08-21 LAB — COMPREHENSIVE METABOLIC PANEL
ALT: 12 U/L (ref 0–35)
AST: 17 U/L (ref 0–37)
Albumin: 3.8 g/dL (ref 3.5–5.2)
Alkaline Phosphatase: 161 U/L — ABNORMAL HIGH (ref 39–117)
BUN: 23 mg/dL (ref 6–23)
CO2: 31 mEq/L (ref 19–32)
Calcium: 9.1 mg/dL (ref 8.4–10.5)
Chloride: 102 mEq/L (ref 96–112)
Creatinine, Ser: 1.09 mg/dL (ref 0.40–1.20)
GFR: 49.59 mL/min — ABNORMAL LOW (ref >60.00)
Glucose, Bld: 105 mg/dL — ABNORMAL HIGH (ref 70–99)
Potassium: 3.8 mEq/L (ref 3.5–5.1)
Sodium: 140 mEq/L (ref 135–145)
Total Bilirubin: 0.4 mg/dL (ref 0.2–1.2)
Total Protein: 5.9 g/dL — ABNORMAL LOW (ref 6.0–8.3)

## 2022-08-21 MED ORDER — APIXABAN 2.5 MG PO TABS: 2.5000 mg | ORAL_TABLET | Freq: Two times a day (BID) | ORAL | 0 refills | Status: DC

## 2022-08-21 NOTE — Assessment & Plan Note (Signed)
Confirmed with 2018 split night test.  She has  continually deferred treatment despite being advised of the risks of untreated OSA but now appears willing to start CPAP.  Home sleep study ordered

## 2022-08-21 NOTE — Assessment & Plan Note (Signed)
Managed by [facility] Late[facility] ,  [facility] [place] [facility],  last OV April 2024  .  Rechecking GFR today .  She is avoiding NSAIDS.  Lab Results  Component Value Date   CREATININE 0.79 07/23/2022   Lab Results  Component Value Date   NA 139 07/23/2022   K 4.2 07/23/2022   CL 100 07/23/2022   CO2 29 07/23/2022

## 2022-08-21 NOTE — Telephone Encounter (Signed)
Melissa from Trauma Service office was calling back returning your call. Her number is [phone]. If you could give a call back.

## 2022-08-21 NOTE — Patient Instructions (Signed)
Stop using clonazepam in combination with oxycodone Resume trazodone for sleep   Eliquis has been refilled to take until your orthopedist contacts [place]  Home sleep study and home health for PT ordered

## 2022-08-21 NOTE — Progress Notes (Unsigned)
Subjective:  Patient ID: Jodi [name], female    DOB: 1945-12-04  Age: 77 y.o. MRN: [id number]  CC: The primary encounter diagnosis was Stage 3a chronic kidney disease (HCC). Diagnoses of Anemia associated with acute blood loss, OSA (obstructive sleep apnea), Closed fracture of right femur with nonunion, unspecified fracture morphology, unspecified portion of femur, subsequent encounter, Anxiety, and Personal history of diabetes mellitus were also pertinent to this visit.   HPI Jodi [name] presents for follow up on recent hospitalization for right femur fracture requiring surgery.  Patient admitted to [facility] on July 11 and transferred to [facility] for complicated orthopedic surgery done by [facility] on July 12 .  Right distal femur fracture secondary to fall onto knee.   Hospitalized from july 11 to July 16,  transferred to SNF for rehab.  Discharged home from SNF on  August 5 and went to daughter's house.  Has been staying with son at [address] [employer]  phone [phone]    1) acute hypoxic resp failure attributed TO  oversedation from pain meds on day of hospitalization .  Was placed on CPAP for history of OSA>  Still taking oxycodone (5/325)  and clonazepam!  [place] Database reviewed:  her Last refill oxycodone was  August  1 #30 . Clonazepan 0.5 mg  July 17 .  LAST SLEEP STUDY IN CHART 2018,  CPAP ORDERED BUT PATIENT STATES THAT SHE HAS NOT USED IT IN YEARS. She is taking cloazepam  elavil, oxycodone and gabapentin.  (  2) anemia of acute blood loss; hgb 10.8 on admission dropped to 7.0  post op was transfused 1 units hgb was 8.1 at d/c to SNF and 9.3 at  RECHECK July 29    3) Hyperglycemia:  no evidence of return of diabetes by A1c.  Lab Results  Component Value Date   HGBA1C 5.3 07/18/2022   4) Anticoagulation with eliquis  currently out of medication.  Not sure how long  to be on it.  Last dose was yesterday. Not walking yet.  Only standing up to put weight on right lwg.  Says  that her left leg is "not cooperating," doesn't want to move .  Needs to resume eiiquis.  Pain managed by orthopedist  with oxycodone.  She state that she is taking oxycodone at bedtime and once during the day for PT>  using clonazepam at bedtime  has one pill.   Progress has been limited.  Not walking.  Was using a stationery bike at the facility and walking at the facility  using a walker to walk about 50 feet.  HAS NOT HAD PT SINCE DISCHARGE FROM REHAB.  Has a walker at home ,  but nobody to help her so she is using a wheelchair.    Outpatient Medications Prior to Visit  Medication Sig Dispense Refill   acetaminophen (TYLENOL) 500 MG tablet Take 1,000 mg by mouth 2 (two) times daily as needed for moderate pain, fever or headache.     amitriptyline (ELAVIL) 50 MG tablet TAKE 1 TABLET BY MOUTH EVERYDAY AT BEDTIME 30 tablet 0   atorvastatin (LIPITOR) 10 MG tablet TAKE 1 TABLET BY MOUTH EVERY DAY (Patient taking differently: Take 10 mg by mouth at bedtime.) 90 tablet 0   buPROPion (WELLBUTRIN SR) 150 MG 12 hr tablet TAKE 1 TABLET BY MOUTH TWICE A DAY 180 tablet 0   docusate sodium (COLACE) 100 MG capsule Take 1 capsule (100 mg total) by mouth  2 (two) times daily.     gabapentin (NEURONTIN) 100 MG capsule TAKE 1 CAPSULE BY MOUTH THREE TIMES A DAY 90 capsule 0   losartan-hydrochlorothiazide (HYZAAR) 50-12.5 MG tablet TAKE 1 TABLET BY MOUTH EVERY DAY 90 tablet 0   magnesium oxide (MAG-OX) 400 (240 Mg) MG tablet Take 1 tablet (400 mg total) by mouth daily.     Multiple Vitamins-Minerals (CENTRUM SILVER 50+WOMEN) TABS Take 1 tablet by mouth daily.     oxyCODONE-acetaminophen (PERCOCET/ROXICET) 5-325 MG tablet Take 1 tablet by mouth every 4 (four) hours as needed for moderate pain or severe pain. 30 tablet 0   polyethylene glycol (MIRALAX / GLYCOLAX) 17 g packet Take 17 g by mouth daily as needed for mild constipation.     sertraline (ZOLOFT) 100 MG tablet TAKE 1.5 TABLETS (150 MG TOTAL) BY MOUTH DAILY  (Patient taking differently: Take 150 mg by mouth at bedtime.) 135 tablet 0   traZODone (DESYREL) 100 MG tablet Take 2 tablets (200 mg total) by mouth at bedtime as needed for sleep. 30 tablet 0   clonazePAM (KLONOPIN) 0.5 MG tablet Take 1 tablet (0.5 mg total) by mouth at bedtime as needed for anxiety. 15 tablet 0   apixaban (ELIQUIS) 2.5 MG TABS tablet Take 1 tablet (2.5 mg total) by mouth 2 (two) times daily. 60 tablet 0   No facility-administered medications prior to visit.    Review of Systems;  Patient denies headache, fevers, malaise, unintentional weight loss, skin rash, eye pain, sinus congestion and sinus pain, sore throat, dysphagia,  hemoptysis , cough, dyspnea, wheezing, chest pain, palpitations, orthopnea, edema, abdominal pain, nausea, melena, diarrhea, constipation, flank pain, dysuria, hematuria, urinary  Frequency, nocturia, numbness, tingling, seizures,  Focal weakness, Loss of consciousness,  Tremor, insomnia, depression, anxiety, and suicidal ideation.      Objective:  BP 122/60   Pulse 84   Ht 5\' 4"  (1.626 m)   Wt 215 lb (97.5 kg)   SpO2 96%   BMI 36.90 kg/m   BP Readings from Last 3 Encounters:  08/21/22 122/60  07/23/22 (!) 104/40  07/18/22 137/68    Wt Readings from Last 3 Encounters:  08/21/22 215 lb (97.5 kg)  07/19/22 215 lb (97.5 kg)  07/17/22 215 lb (97.5 kg)    Physical Exam Vitals reviewed.  Constitutional:      General: She is not in acute distress.    Appearance: Normal appearance. She is obese. She is not ill-appearing, toxic-appearing or diaphoretic.     Comments: Seated in bariatric wheelchair   HENT:     Head: Normocephalic.  Eyes:     General: No scleral icterus.       Right eye: No discharge.        Left eye: No discharge.     Conjunctiva/sclera: Conjunctivae normal.  Cardiovascular:     Rate and Rhythm: Normal rate and regular rhythm.     Heart sounds: Normal heart sounds.  Pulmonary:     Effort: Pulmonary effort is normal.  No respiratory distress.     Breath sounds: Normal breath sounds.  Musculoskeletal:        General: Normal range of motion.  Skin:    General: Skin is warm and dry.     Comments: Right knee without warmth , redness or effusion   Neurological:     General: No focal deficit present.     Mental Status: She is alert and oriented to person, place, and time. Mental status is at baseline.  Motor: Weakness present.     Gait: Gait abnormal.     Comments: Able to stand for a few minutes.    Psychiatric:        Mood and Affect: Mood normal.        Behavior: Behavior normal.        Thought Content: Thought content normal.        Judgment: Judgment normal.    Lab Results  Component Value Date   HGBA1C 5.3 07/18/2022   HGBA1C 6.0 09/25/2021   HGBA1C 6.1 03/15/2021    Lab Results  Component Value Date   CREATININE 0.79 07/23/2022   CREATININE 0.94 07/22/2022   CREATININE 0.99 07/21/2022    Lab Results  Component Value Date   WBC 7.5 07/23/2022   HGB 8.1 (L) 07/23/2022   HCT 24.7 (L) 07/23/2022   PLT 221 07/23/2022   GLUCOSE 133 (H) 07/23/2022   CHOL 159 03/15/2021   TRIG 107.0 03/15/2021   HDL 81.00 03/15/2021   LDLDIRECT 105.0 05/09/2016   LDLCALC 57 03/15/2021   ALT 10 09/25/2021   AST 14 09/25/2021   NA 139 07/23/2022   K 4.2 07/23/2022   CL 100 07/23/2022   CREATININE 0.79 07/23/2022   BUN 21 07/23/2022   CO2 29 07/23/2022   TSH 3.87 03/15/2021   INR 1.1 07/18/2022   HGBA1C 5.3 07/18/2022   MICROALBUR 1.5 03/15/2021    DG FEMUR PORT, MIN 2 VIEWS RIGHT  Result Date: 07/19/2022 CLINICAL DATA:  Follow-up femur fracture EXAM: RIGHT FEMUR PORTABLE 2 VIEW COMPARISON:  07/17/2022 FINDINGS: Status post surgical plate and multiple screw fixation of acute comminuted fracture involving the distal femoral shaft and extending to level of right knee prosthesis. Decreased fracture displacement and angulation. Residual long 17 mm displaced medial shaft fracture fragment with 2  most distal screw tips partially anchored within IMPRESSION: Status post ORIF of distal femoral shaft fracture with decreased displacement and angulation. Electronically Signed   By: Jasmine Pang M.D.   On: 07/19/2022 15:23   DG FEMUR, MIN 2 VIEWS RIGHT  Result Date: 07/19/2022 CLINICAL DATA:  Open reduction internal fixation EXAM: RIGHT FEMUR 2 VIEWS COMPARISON:  07/17/2022 FINDINGS: Seven low resolution intraoperative spot views of the right femur. Total fluoroscopy time was 1 minutes 50 seconds, fluoroscopic dose of 15.43 mGy. The images demonstrate a right knee replacement. There is placement of surgical plate and multiple fixating screws across comminuted distal femoral periprosthetic fracture. IMPRESSION: Intraoperative fluoroscopic assistance provided during ORIF of distal femoral periprosthetic fracture. Electronically Signed   By: Jasmine Pang M.D.   On: 07/19/2022 15:20   DG C-Arm 1-60 Min-No Report  Result Date: 07/19/2022 Fluoroscopy was utilized by the requesting physician.  No radiographic interpretation.    Assessment & Plan:  .Stage 3a chronic kidney disease [facility]) Assessment & Plan: Managed by Mumsoor Lateef ,  [facility] [place] [facility],  last OV April 2024  .  Rechecking GFR today .  She is avoiding NSAIDS.  Lab Results  Component Value Date   CREATININE 0.79 07/23/2022   Lab Results  Component Value Date   NA 139 07/23/2022   K 4.2 07/23/2022   CL 100 07/23/2022   CO2 29 07/23/2022     Orders: -     Comprehensive metabolic panel  Anemia associated with acute blood loss -     CBC with Differential/Platelet  OSA (obstructive sleep apnea) Assessment & Plan: Confirmed with 2018 split night test.  She has  continually deferred treatment  despite being advised of the risks of untreated OSA but now appears willing to start CPAP.  Home sleep study ordered   Orders: -     PSG SLEEP STUDY; Future  Closed fracture of right femur with nonunion, unspecified fracture  morphology, unspecified portion of femur, subsequent encounter Assessment & Plan: Supracondylar  secondary to fall onto patella,  s/p distal femur arthroplasty by Victorino Dike on July 12.  She has not had PT since she left the rehab facility on August 5 . It is unclear why Orthopedics did not order home PT,  but I have ordered it today.  Continue Eliquis for DVT prophylaxis until she is ambulating.   Orders: -     Ambulatory referral to Home Health  Anxiety Assessment & Plan: Stopping clonazepam (currently taking at night since d/c from SNF, along with concurrent use of oxycodone) , gabapentin and elavil and untreated OSA. Marland Kitchen  Continue sertraline    Personal history of diabetes mellitus Assessment & Plan: A1c was checked during recent hospitalization due to hyperglycemia Lab Results  Component Value Date   HGBA1C 5.3 07/18/2022      Other orders -     Apixaban; Take 1 tablet (2.5 mg total) by mouth 2 (two) times daily.  Dispense: 60 tablet; Refill: 0     I provided  40 minutes on the day of this face-to-face  encounter reviewing patient's recent hospitalization, most recent  visit with nephrology, , recent  surgical and non surgical procedures, previous  labs and imaging studies, counseling on currently addressed issues,  and post visit ordering to diagnostics and therapeutics .    Follow-up: Return in about 3 months (around 11/21/2022).   Sherlene Shams, MD

## 2022-08-21 NOTE — Assessment & Plan Note (Addendum)
Supracondylar  secondary to fall onto patella,  s/p distal femur arthroplasty by Victorino Dike on July 12.  She has not had PT since she left the rehab facility on August 5 . It is unclear why Orthopedics did not order home PT,  but I have ordered it today.  Continue Eliquis for DVT prophylaxis until she is ambulating.

## 2022-08-21 NOTE — Telephone Encounter (Signed)
Spoke with Efraim Kaufmann and she stated that per Dr. Luvenia Starch PA they usually only do the Eliquis for 30 days so that would give pt a stop date of 08/23/2022.

## 2022-08-21 NOTE — Assessment & Plan Note (Signed)
A1c was checked during recent hospitalization due to hyperglycemia Lab Results  Component Value Date   HGBA1C 5.3 07/18/2022

## 2022-08-21 NOTE — Assessment & Plan Note (Signed)
Stopping clonazepam (currently taking at night since d/c from SNF, along with concurrent use of oxycodone) , gabapentin and elavil and untreated OSA. Marland Kitchen  Continue sertraline

## 2022-08-22 DIAGNOSIS — G4733 Obstructive sleep apnea (adult) (pediatric): Secondary | ICD-10-CM | POA: Diagnosis not present

## 2022-08-22 DIAGNOSIS — W19XXXD Unspecified fall, subsequent encounter: Secondary | ICD-10-CM | POA: Diagnosis not present

## 2022-08-22 DIAGNOSIS — F411 Generalized anxiety disorder: Secondary | ICD-10-CM | POA: Diagnosis not present

## 2022-08-22 DIAGNOSIS — M9711XD Periprosthetic fracture around internal prosthetic right knee joint, subsequent encounter: Secondary | ICD-10-CM | POA: Diagnosis not present

## 2022-08-22 DIAGNOSIS — E1122 Type 2 diabetes mellitus with diabetic chronic kidney disease: Secondary | ICD-10-CM | POA: Diagnosis not present

## 2022-08-22 DIAGNOSIS — I129 Hypertensive chronic kidney disease with stage 1 through stage 4 chronic kidney disease, or unspecified chronic kidney disease: Secondary | ICD-10-CM | POA: Diagnosis not present

## 2022-08-22 DIAGNOSIS — N182 Chronic kidney disease, stage 2 (mild): Secondary | ICD-10-CM | POA: Diagnosis not present

## 2022-08-22 DIAGNOSIS — F331 Major depressive disorder, recurrent, moderate: Secondary | ICD-10-CM | POA: Diagnosis not present

## 2022-08-22 DIAGNOSIS — S72451D Displaced supracondylar fracture without intracondylar extension of lower end of right femur, subsequent encounter for closed fracture with routine healing: Secondary | ICD-10-CM | POA: Diagnosis not present

## 2022-08-23 NOTE — Telephone Encounter (Signed)
Pt is aware and gave a verbal understanding.  

## 2022-08-27 DIAGNOSIS — E1122 Type 2 diabetes mellitus with diabetic chronic kidney disease: Secondary | ICD-10-CM | POA: Diagnosis not present

## 2022-08-27 DIAGNOSIS — I129 Hypertensive chronic kidney disease with stage 1 through stage 4 chronic kidney disease, or unspecified chronic kidney disease: Secondary | ICD-10-CM | POA: Diagnosis not present

## 2022-08-27 DIAGNOSIS — W19XXXD Unspecified fall, subsequent encounter: Secondary | ICD-10-CM | POA: Diagnosis not present

## 2022-08-27 DIAGNOSIS — F411 Generalized anxiety disorder: Secondary | ICD-10-CM | POA: Diagnosis not present

## 2022-08-27 DIAGNOSIS — N182 Chronic kidney disease, stage 2 (mild): Secondary | ICD-10-CM | POA: Diagnosis not present

## 2022-08-27 DIAGNOSIS — G4733 Obstructive sleep apnea (adult) (pediatric): Secondary | ICD-10-CM | POA: Diagnosis not present

## 2022-08-27 DIAGNOSIS — S72451D Displaced supracondylar fracture without intracondylar extension of lower end of right femur, subsequent encounter for closed fracture with routine healing: Secondary | ICD-10-CM | POA: Diagnosis not present

## 2022-08-27 DIAGNOSIS — R0602 Shortness of breath: Secondary | ICD-10-CM | POA: Diagnosis not present

## 2022-08-27 DIAGNOSIS — F331 Major depressive disorder, recurrent, moderate: Secondary | ICD-10-CM | POA: Diagnosis not present

## 2022-08-27 DIAGNOSIS — M9711XD Periprosthetic fracture around internal prosthetic right knee joint, subsequent encounter: Secondary | ICD-10-CM | POA: Diagnosis not present

## 2022-08-28 DIAGNOSIS — N182 Chronic kidney disease, stage 2 (mild): Secondary | ICD-10-CM | POA: Diagnosis not present

## 2022-08-28 DIAGNOSIS — G4733 Obstructive sleep apnea (adult) (pediatric): Secondary | ICD-10-CM | POA: Diagnosis not present

## 2022-08-28 DIAGNOSIS — E785 Hyperlipidemia, unspecified: Secondary | ICD-10-CM

## 2022-08-28 DIAGNOSIS — E1122 Type 2 diabetes mellitus with diabetic chronic kidney disease: Secondary | ICD-10-CM | POA: Diagnosis not present

## 2022-08-28 DIAGNOSIS — E669 Obesity, unspecified: Secondary | ICD-10-CM

## 2022-08-28 DIAGNOSIS — I129 Hypertensive chronic kidney disease with stage 1 through stage 4 chronic kidney disease, or unspecified chronic kidney disease: Secondary | ICD-10-CM | POA: Diagnosis not present

## 2022-08-28 DIAGNOSIS — F331 Major depressive disorder, recurrent, moderate: Secondary | ICD-10-CM | POA: Diagnosis not present

## 2022-08-28 DIAGNOSIS — W19XXXD Unspecified fall, subsequent encounter: Secondary | ICD-10-CM | POA: Diagnosis not present

## 2022-08-28 DIAGNOSIS — S72451D Displaced supracondylar fracture without intracondylar extension of lower end of right femur, subsequent encounter for closed fracture with routine healing: Secondary | ICD-10-CM | POA: Diagnosis not present

## 2022-08-28 DIAGNOSIS — M9711XD Periprosthetic fracture around internal prosthetic right knee joint, subsequent encounter: Secondary | ICD-10-CM | POA: Diagnosis not present

## 2022-08-28 DIAGNOSIS — F411 Generalized anxiety disorder: Secondary | ICD-10-CM | POA: Diagnosis not present

## 2022-08-28 DIAGNOSIS — M159 Polyosteoarthritis, unspecified: Secondary | ICD-10-CM

## 2022-08-29 DIAGNOSIS — W19XXXD Unspecified fall, subsequent encounter: Secondary | ICD-10-CM | POA: Diagnosis not present

## 2022-08-29 DIAGNOSIS — G4733 Obstructive sleep apnea (adult) (pediatric): Secondary | ICD-10-CM | POA: Diagnosis not present

## 2022-08-29 DIAGNOSIS — E1122 Type 2 diabetes mellitus with diabetic chronic kidney disease: Secondary | ICD-10-CM | POA: Diagnosis not present

## 2022-08-29 DIAGNOSIS — F331 Major depressive disorder, recurrent, moderate: Secondary | ICD-10-CM | POA: Diagnosis not present

## 2022-08-29 DIAGNOSIS — N182 Chronic kidney disease, stage 2 (mild): Secondary | ICD-10-CM | POA: Diagnosis not present

## 2022-08-29 DIAGNOSIS — M9711XD Periprosthetic fracture around internal prosthetic right knee joint, subsequent encounter: Secondary | ICD-10-CM | POA: Diagnosis not present

## 2022-08-29 DIAGNOSIS — S72451D Displaced supracondylar fracture without intracondylar extension of lower end of right femur, subsequent encounter for closed fracture with routine healing: Secondary | ICD-10-CM | POA: Diagnosis not present

## 2022-08-29 DIAGNOSIS — F411 Generalized anxiety disorder: Secondary | ICD-10-CM | POA: Diagnosis not present

## 2022-08-29 DIAGNOSIS — I129 Hypertensive chronic kidney disease with stage 1 through stage 4 chronic kidney disease, or unspecified chronic kidney disease: Secondary | ICD-10-CM | POA: Diagnosis not present

## 2022-08-30 DIAGNOSIS — G4733 Obstructive sleep apnea (adult) (pediatric): Secondary | ICD-10-CM | POA: Diagnosis not present

## 2022-08-30 DIAGNOSIS — R0602 Shortness of breath: Secondary | ICD-10-CM | POA: Diagnosis not present

## 2022-09-03 DIAGNOSIS — S72451D Displaced supracondylar fracture without intracondylar extension of lower end of right femur, subsequent encounter for closed fracture with routine healing: Secondary | ICD-10-CM | POA: Diagnosis not present

## 2022-09-04 DIAGNOSIS — W19XXXD Unspecified fall, subsequent encounter: Secondary | ICD-10-CM | POA: Diagnosis not present

## 2022-09-04 DIAGNOSIS — M9711XD Periprosthetic fracture around internal prosthetic right knee joint, subsequent encounter: Secondary | ICD-10-CM | POA: Diagnosis not present

## 2022-09-04 DIAGNOSIS — I129 Hypertensive chronic kidney disease with stage 1 through stage 4 chronic kidney disease, or unspecified chronic kidney disease: Secondary | ICD-10-CM | POA: Diagnosis not present

## 2022-09-04 DIAGNOSIS — G4733 Obstructive sleep apnea (adult) (pediatric): Secondary | ICD-10-CM | POA: Diagnosis not present

## 2022-09-04 DIAGNOSIS — F331 Major depressive disorder, recurrent, moderate: Secondary | ICD-10-CM | POA: Diagnosis not present

## 2022-09-04 DIAGNOSIS — S72451D Displaced supracondylar fracture without intracondylar extension of lower end of right femur, subsequent encounter for closed fracture with routine healing: Secondary | ICD-10-CM | POA: Diagnosis not present

## 2022-09-04 DIAGNOSIS — F411 Generalized anxiety disorder: Secondary | ICD-10-CM | POA: Diagnosis not present

## 2022-09-04 DIAGNOSIS — E1122 Type 2 diabetes mellitus with diabetic chronic kidney disease: Secondary | ICD-10-CM | POA: Diagnosis not present

## 2022-09-04 DIAGNOSIS — N182 Chronic kidney disease, stage 2 (mild): Secondary | ICD-10-CM | POA: Diagnosis not present

## 2022-09-05 DIAGNOSIS — G4733 Obstructive sleep apnea (adult) (pediatric): Secondary | ICD-10-CM | POA: Diagnosis not present

## 2022-09-05 DIAGNOSIS — F411 Generalized anxiety disorder: Secondary | ICD-10-CM | POA: Diagnosis not present

## 2022-09-05 DIAGNOSIS — E1122 Type 2 diabetes mellitus with diabetic chronic kidney disease: Secondary | ICD-10-CM | POA: Diagnosis not present

## 2022-09-05 DIAGNOSIS — S72451D Displaced supracondylar fracture without intracondylar extension of lower end of right femur, subsequent encounter for closed fracture with routine healing: Secondary | ICD-10-CM | POA: Diagnosis not present

## 2022-09-05 DIAGNOSIS — M9711XD Periprosthetic fracture around internal prosthetic right knee joint, subsequent encounter: Secondary | ICD-10-CM | POA: Diagnosis not present

## 2022-09-05 DIAGNOSIS — N182 Chronic kidney disease, stage 2 (mild): Secondary | ICD-10-CM | POA: Diagnosis not present

## 2022-09-05 DIAGNOSIS — I129 Hypertensive chronic kidney disease with stage 1 through stage 4 chronic kidney disease, or unspecified chronic kidney disease: Secondary | ICD-10-CM | POA: Diagnosis not present

## 2022-09-05 DIAGNOSIS — F331 Major depressive disorder, recurrent, moderate: Secondary | ICD-10-CM | POA: Diagnosis not present

## 2022-09-05 DIAGNOSIS — W19XXXD Unspecified fall, subsequent encounter: Secondary | ICD-10-CM | POA: Diagnosis not present

## 2022-09-10 DIAGNOSIS — G4733 Obstructive sleep apnea (adult) (pediatric): Secondary | ICD-10-CM | POA: Diagnosis not present

## 2022-09-10 DIAGNOSIS — W19XXXD Unspecified fall, subsequent encounter: Secondary | ICD-10-CM | POA: Diagnosis not present

## 2022-09-10 DIAGNOSIS — S72451D Displaced supracondylar fracture without intracondylar extension of lower end of right femur, subsequent encounter for closed fracture with routine healing: Secondary | ICD-10-CM | POA: Diagnosis not present

## 2022-09-10 DIAGNOSIS — F411 Generalized anxiety disorder: Secondary | ICD-10-CM | POA: Diagnosis not present

## 2022-09-10 DIAGNOSIS — F331 Major depressive disorder, recurrent, moderate: Secondary | ICD-10-CM | POA: Diagnosis not present

## 2022-09-10 DIAGNOSIS — M9711XD Periprosthetic fracture around internal prosthetic right knee joint, subsequent encounter: Secondary | ICD-10-CM | POA: Diagnosis not present

## 2022-09-10 DIAGNOSIS — I129 Hypertensive chronic kidney disease with stage 1 through stage 4 chronic kidney disease, or unspecified chronic kidney disease: Secondary | ICD-10-CM | POA: Diagnosis not present

## 2022-09-10 DIAGNOSIS — N182 Chronic kidney disease, stage 2 (mild): Secondary | ICD-10-CM | POA: Diagnosis not present

## 2022-09-10 DIAGNOSIS — E1122 Type 2 diabetes mellitus with diabetic chronic kidney disease: Secondary | ICD-10-CM | POA: Diagnosis not present

## 2022-09-10 NOTE — Telephone Encounter (Signed)
She has severe sleep apnea resulting in low oxygen levels at night.  CPAP and oxygen have been ordered for her if she doesn't wear it , her risk of heart failure and stroke are increased

## 2022-09-10 NOTE — Telephone Encounter (Signed)
Voice mall not set un able to leave message

## 2022-09-10 NOTE — Assessment & Plan Note (Signed)
Severe by repeat home sleep study,  with desats < 90% .  CPAP and supplemental oxygen ordered.

## 2022-09-12 DIAGNOSIS — I129 Hypertensive chronic kidney disease with stage 1 through stage 4 chronic kidney disease, or unspecified chronic kidney disease: Secondary | ICD-10-CM | POA: Diagnosis not present

## 2022-09-12 DIAGNOSIS — F411 Generalized anxiety disorder: Secondary | ICD-10-CM | POA: Diagnosis not present

## 2022-09-12 DIAGNOSIS — W19XXXD Unspecified fall, subsequent encounter: Secondary | ICD-10-CM | POA: Diagnosis not present

## 2022-09-12 DIAGNOSIS — N182 Chronic kidney disease, stage 2 (mild): Secondary | ICD-10-CM | POA: Diagnosis not present

## 2022-09-12 DIAGNOSIS — F331 Major depressive disorder, recurrent, moderate: Secondary | ICD-10-CM | POA: Diagnosis not present

## 2022-09-12 DIAGNOSIS — E1122 Type 2 diabetes mellitus with diabetic chronic kidney disease: Secondary | ICD-10-CM | POA: Diagnosis not present

## 2022-09-12 DIAGNOSIS — G4733 Obstructive sleep apnea (adult) (pediatric): Secondary | ICD-10-CM | POA: Diagnosis not present

## 2022-09-12 DIAGNOSIS — M9711XD Periprosthetic fracture around internal prosthetic right knee joint, subsequent encounter: Secondary | ICD-10-CM | POA: Diagnosis not present

## 2022-09-12 DIAGNOSIS — S72451D Displaced supracondylar fracture without intracondylar extension of lower end of right femur, subsequent encounter for closed fracture with routine healing: Secondary | ICD-10-CM | POA: Diagnosis not present

## 2022-09-13 MED ORDER — TRAMADOL HCL 50 MG PO TABS
50.0000 mg | ORAL_TABLET | Freq: Two times a day (BID) | ORAL | 0 refills | Status: AC | PRN
Start: 2022-09-13 — End: 2022-10-13

## 2022-09-13 NOTE — Telephone Encounter (Signed)
Tramadol sent #60    for use 2 times daily  if  she requires more frequent dosing  she will need to be seen

## 2022-09-13 NOTE — Telephone Encounter (Signed)
Spoke with Jodi [name] and informed her of her sleep study results. Jodi [name] gave a verbal understanding.   While on the phone with the Jodi [name] she stated that at her last appt her and Dr. Darrick Huntsman discussed her being prescribed Tramadol to help with her arthritis pain. The medication was not sent in. Jodi [name] is wanting to know if she is still going to send in the tramadol. I advised the Jodi [name] that Dr. Darrick Huntsman is out of the office until Monday. Jodi [name] gave a verbal understanding and is okay with waiting till Dr. Darrick Huntsman returns.

## 2022-09-16 NOTE — Telephone Encounter (Signed)
Attempted to call pt- No answer/No voicemail.  

## 2022-09-18 DIAGNOSIS — N182 Chronic kidney disease, stage 2 (mild): Secondary | ICD-10-CM | POA: Diagnosis not present

## 2022-09-18 DIAGNOSIS — E1122 Type 2 diabetes mellitus with diabetic chronic kidney disease: Secondary | ICD-10-CM | POA: Diagnosis not present

## 2022-09-18 DIAGNOSIS — M9711XD Periprosthetic fracture around internal prosthetic right knee joint, subsequent encounter: Secondary | ICD-10-CM | POA: Diagnosis not present

## 2022-09-18 DIAGNOSIS — S72451D Displaced supracondylar fracture without intracondylar extension of lower end of right femur, subsequent encounter for closed fracture with routine healing: Secondary | ICD-10-CM | POA: Diagnosis not present

## 2022-09-18 DIAGNOSIS — G4733 Obstructive sleep apnea (adult) (pediatric): Secondary | ICD-10-CM | POA: Diagnosis not present

## 2022-09-18 DIAGNOSIS — F331 Major depressive disorder, recurrent, moderate: Secondary | ICD-10-CM | POA: Diagnosis not present

## 2022-09-18 DIAGNOSIS — W19XXXD Unspecified fall, subsequent encounter: Secondary | ICD-10-CM | POA: Diagnosis not present

## 2022-09-18 DIAGNOSIS — I129 Hypertensive chronic kidney disease with stage 1 through stage 4 chronic kidney disease, or unspecified chronic kidney disease: Secondary | ICD-10-CM | POA: Diagnosis not present

## 2022-09-18 DIAGNOSIS — F411 Generalized anxiety disorder: Secondary | ICD-10-CM | POA: Diagnosis not present

## 2022-09-25 DIAGNOSIS — F331 Major depressive disorder, recurrent, moderate: Secondary | ICD-10-CM | POA: Diagnosis not present

## 2022-09-25 DIAGNOSIS — E1122 Type 2 diabetes mellitus with diabetic chronic kidney disease: Secondary | ICD-10-CM | POA: Diagnosis not present

## 2022-09-25 DIAGNOSIS — F411 Generalized anxiety disorder: Secondary | ICD-10-CM | POA: Diagnosis not present

## 2022-09-25 DIAGNOSIS — M9711XD Periprosthetic fracture around internal prosthetic right knee joint, subsequent encounter: Secondary | ICD-10-CM | POA: Diagnosis not present

## 2022-09-25 DIAGNOSIS — S72451D Displaced supracondylar fracture without intracondylar extension of lower end of right femur, subsequent encounter for closed fracture with routine healing: Secondary | ICD-10-CM | POA: Diagnosis not present

## 2022-09-25 DIAGNOSIS — I129 Hypertensive chronic kidney disease with stage 1 through stage 4 chronic kidney disease, or unspecified chronic kidney disease: Secondary | ICD-10-CM | POA: Diagnosis not present

## 2022-09-25 DIAGNOSIS — G4733 Obstructive sleep apnea (adult) (pediatric): Secondary | ICD-10-CM | POA: Diagnosis not present

## 2022-09-25 DIAGNOSIS — W19XXXD Unspecified fall, subsequent encounter: Secondary | ICD-10-CM | POA: Diagnosis not present

## 2022-09-25 DIAGNOSIS — N182 Chronic kidney disease, stage 2 (mild): Secondary | ICD-10-CM | POA: Diagnosis not present

## 2022-10-02 DIAGNOSIS — I129 Hypertensive chronic kidney disease with stage 1 through stage 4 chronic kidney disease, or unspecified chronic kidney disease: Secondary | ICD-10-CM | POA: Diagnosis not present

## 2022-10-02 DIAGNOSIS — G4733 Obstructive sleep apnea (adult) (pediatric): Secondary | ICD-10-CM | POA: Diagnosis not present

## 2022-10-02 DIAGNOSIS — S72451D Displaced supracondylar fracture without intracondylar extension of lower end of right femur, subsequent encounter for closed fracture with routine healing: Secondary | ICD-10-CM | POA: Diagnosis not present

## 2022-10-02 DIAGNOSIS — F411 Generalized anxiety disorder: Secondary | ICD-10-CM | POA: Diagnosis not present

## 2022-10-02 DIAGNOSIS — N182 Chronic kidney disease, stage 2 (mild): Secondary | ICD-10-CM | POA: Diagnosis not present

## 2022-10-02 DIAGNOSIS — E1122 Type 2 diabetes mellitus with diabetic chronic kidney disease: Secondary | ICD-10-CM | POA: Diagnosis not present

## 2022-10-02 DIAGNOSIS — W19XXXD Unspecified fall, subsequent encounter: Secondary | ICD-10-CM | POA: Diagnosis not present

## 2022-10-02 DIAGNOSIS — F331 Major depressive disorder, recurrent, moderate: Secondary | ICD-10-CM | POA: Diagnosis not present

## 2022-10-02 DIAGNOSIS — M9711XD Periprosthetic fracture around internal prosthetic right knee joint, subsequent encounter: Secondary | ICD-10-CM | POA: Diagnosis not present

## 2022-10-15 DIAGNOSIS — S72451D Displaced supracondylar fracture without intracondylar extension of lower end of right femur, subsequent encounter for closed fracture with routine healing: Secondary | ICD-10-CM | POA: Diagnosis not present

## 2022-10-26 ENCOUNTER — Other Ambulatory Visit: Payer: Self-pay | Admitting: Internal Medicine

## 2022-11-04 ENCOUNTER — Ambulatory Visit (INDEPENDENT_AMBULATORY_CARE_PROVIDER_SITE_OTHER): Payer: Medicare HMO | Admitting: *Deleted

## 2022-11-04 VITALS — Ht 64.0 in | Wt 203.0 lb

## 2022-11-04 DIAGNOSIS — Z0000 Encounter for general adult medical examination without abnormal findings: Secondary | ICD-10-CM | POA: Diagnosis not present

## 2022-11-04 NOTE — Patient Instructions (Signed)
Jodi [name] , Thank you for taking time to come for your Medicare Wellness Visit. I appreciate your ongoing commitment to your health goals. Please review the following plan we discussed and let me know if I can assist you in the future.   Referrals/Orders/Follow-Ups/Clinician Recommendations: Remember to update your vaccines and schedule an eye exam.  This is a list of the screening recommended for you and due dates:  Health Maintenance  Topic Date Due   COVID-19 Vaccine (1) Never done   DTaP/Tdap/Td vaccine (1 - Tdap) Never done   Zoster (Shingles) Vaccine (1 of 2) 10/26/1964   Yearly kidney health urinalysis for diabetes  03/16/2022   Complete foot exam   09/26/2022   Eye exam for diabetics  01/07/2023*   Flu Shot  04/07/2023*   Mammogram  01/07/2024*   Colon Cancer Screening  01/07/2024*   Hemoglobin A1C  01/18/2023   Yearly kidney function blood test for diabetes  08/21/2023   Medicare Annual Wellness Visit  11/04/2023   Pneumonia Vaccine  Completed   DEXA scan (bone density measurement)  Completed   Hepatitis C Screening  Completed   HPV Vaccine  Aged Out  *Topic was postponed. The date shown is not the original due date.    Advanced directives: (Declined) Advance directive discussed with you today. Even though you declined this today, please call our office should you change your mind, and we can give you the proper paperwork for you to fill out.  Next Medicare Annual Wellness Visit scheduled for next year: Yes 11/10/2023 @ 2:10

## 2022-11-04 NOTE — Progress Notes (Signed)
Subjective:   Jodi [name] is a 77 y.o. female who presents for Medicare Annual (Subsequent) preventive examination.  Visit Complete: Virtual I connected with  Alyson Reedy on 11/04/22 by a audio enabled telemedicine application and verified that I am speaking with the correct person using two identifiers.  Patient Location: Home  Provider Location: Office/Clinic  I discussed the limitations of evaluation and management by telemedicine. The patient expressed understanding and agreed to proceed.  Vital Signs: Because this visit was a virtual/telehealth visit, some criteria may be missing or patient reported. Any vitals not documented were not able to be obtained and vitals that have been documented are patient reported.    Cardiac Risk Factors include: advanced age (>25men, >71 women);diabetes mellitus;obesity (BMI >30kg/m2);Other (see comment), Risk factor comments: arrhythmia     Objective:    Today's Vitals   11/04/22 1502  Weight: 203 lb (92.1 kg)  Height: 5\' 4"  (1.626 m)   Body mass index is 34.84 kg/m.     11/04/2022    3:26 PM 07/18/2022    4:00 AM 07/17/2022   10:19 PM 10/30/2021    3:59 PM 08/17/2020   12:40 PM 06/09/2020    3:20 PM 06/02/2019   11:45 AM  Advanced Directives  Does Patient Have a Medical Advance Directive? No No No No No No No  Does patient want to make changes to medical advance directive?       No - Patient declined  Would patient like information on creating a medical advance directive? No - Patient declined Yes (Inpatient - patient requests chaplain consult to create a medical advance directive) No - Patient declined No - Patient declined No - Patient declined Yes (MAU/Ambulatory/Procedural Areas - Information given)     Current Medications (verified) Outpatient Encounter Medications as of 11/04/2022  Medication Sig   acetaminophen (TYLENOL) 500 MG tablet Take 1,000 mg by mouth 2 (two) times daily as needed for moderate pain, fever or  headache.   atorvastatin (LIPITOR) 10 MG tablet TAKE 1 TABLET BY MOUTH EVERY DAY   buPROPion (WELLBUTRIN SR) 150 MG 12 hr tablet TAKE 1 TABLET BY MOUTH TWICE A DAY   docusate sodium (COLACE) 100 MG capsule Take 1 capsule (100 mg total) by mouth 2 (two) times daily.   gabapentin (NEURONTIN) 100 MG capsule TAKE 1 CAPSULE BY MOUTH THREE TIMES A DAY   losartan-hydrochlorothiazide (HYZAAR) 50-12.5 MG tablet TAKE 1 TABLET BY MOUTH EVERY DAY   Multiple Vitamins-Minerals (CENTRUM SILVER 50+WOMEN) TABS Take 1 tablet by mouth daily.   polyethylene glycol (MIRALAX / GLYCOLAX) 17 g packet Take 17 g by mouth daily as needed for mild constipation.   sertraline (ZOLOFT) 100 MG tablet TAKE 1.5 TABLETS (150MG  TOTAL) BY MOUTH DAILY   traZODone (DESYREL) 100 MG tablet TAKE 1 TABLET BY MOUTH TWICE A DAY   apixaban (ELIQUIS) 2.5 MG TABS tablet TAKE 1 TABLET BY MOUTH TWICE A DAY (Patient not taking: Reported on 11/04/2022)   magnesium oxide (MAG-OX) 400 (240 Mg) MG tablet Take 1 tablet (400 mg total) by mouth daily. (Patient not taking: Reported on 11/04/2022)   No facility-administered encounter medications on file as of 11/04/2022.    Allergies (verified) Morphine and codeine   History: Past Medical History:  Diagnosis Date   Anxiety    Arthritis    Diabetes mellitus without complication (HCC)    diet controlled since gastric bypass   Hypertension    Sleep apnea    Past Surgical History:  Procedure Laterality Date   BLADDER SUSPENSION     CHOLECYSTECTOMY     COLONOSCOPY WITH PROPOFOL N/A 07/16/2016   Procedure: COLONOSCOPY WITH PROPOFOL;  Surgeon: Wyline Mood, MD;  Location: [facility] ENDOSCOPY;  Service: Endoscopy;  Laterality: N/A;   JOINT REPLACEMENT     left hip, right knee   ORIF FEMUR FRACTURE Right 07/19/2022   Procedure: OPEN REDUCTION INTERNAL FIXATION (ORIF) DISTAL FEMUR FRACTURE;  Surgeon: Roby Lofts, MD;  Location: [facility] OR;  Service: Orthopedics;  Laterality: Right;   ROUX-EN-Y PROCEDURE   2010   tracheotomy     at age 54 due to respiratroy failure   Family History  Problem Relation Age of Onset   COPD Mother    Hypertension Mother    Ovarian cancer Mother 2       ovarian CA   Breast cancer Daughter    Breast cancer Paternal Aunt    Breast cancer Paternal Grandmother    Cancer Sister    Social History   Socioeconomic History   Marital status: Widowed    Spouse name: Not on file   Number of children: Not on file   Years of education: Not on file   Highest education level: Not on file  Occupational History   Not on file  Tobacco Use   Smoking status: Never   Smokeless tobacco: Never  Vaping Use   Vaping status: Never Used  Substance and Sexual Activity   Alcohol use: No   Drug use: No   Sexual activity: Never  Other Topics Concern   Not on file  Social History Narrative   Widowed   Lives with son   Social Determinants of Health   Financial Resource Strain: Low Risk  (11/04/2022)   Overall Financial Resource Strain (CARDIA)    Difficulty of Paying Living Expenses: Not hard at all  Food Insecurity: No Food Insecurity (11/04/2022)   Hunger Vital Sign    Worried About Running Out of Food in the Last Year: Never true    Ran Out of Food in the Last Year: Never true  Transportation Needs: No Transportation Needs (11/04/2022)   PRAPARE - Administrator, Civil Service (Medical): No    Lack of Transportation (Non-Medical): No  Physical Activity: Inactive (11/04/2022)   Exercise Vital Sign    Days of Exercise per Week: 0 days    Minutes of Exercise per Session: 0 min  Stress: No Stress Concern Present (11/04/2022)   [employer] of Occupational Health - Occupational Stress Questionnaire    Feeling of Stress : Only a little  Social Connections: Socially Isolated (11/04/2022)   Social Connection and Isolation Panel [NHANES]    Frequency of Communication with Friends and Family: More than three times a week    Frequency of Social  Gatherings with Friends and Family: More than three times a week    Attends Religious Services: Never    Database administrator or Organizations: No    Attends Banker Meetings: Never    Marital Status: Widowed    Tobacco Counseling Counseling given: Not Answered   Clinical Intake:  Pre-visit preparation completed: Yes  Pain : No/denies pain     BMI - recorded: 34.84 Nutritional Status: BMI > 30  Obese Nutritional Risks: None Diabetes: Yes CBG done?: No Did pt. bring in CBG monitor from home?: No  How often do you need to have someone help you when you read instructions, pamphlets, or other written materials from  your doctor or pharmacy?: 1 - Never  Interpreter Needed?: No  Information entered by :: R. Wren Gallaga LPN   Activities of Daily Living    11/04/2022    3:06 PM 07/18/2022    4:00 AM  In your present state of health, do you have any difficulty performing the following activities:  Hearing? 1 1  Comment some   Vision? 0 0  Comment glasses   Difficulty concentrating or making decisions? 0 0  Walking or climbing stairs? 1 1  Comment on a walker   Dressing or bathing? 0 0  Doing errands, shopping? 1 0  Comment family helps   Preparing Food and eating ? N   Using the Toilet? N   In the past six months, have you accidently leaked urine? Y   Comment wears pull ups   Do you have problems with loss of bowel control? N   Managing your Medications? N   Managing your Finances? N   Housekeeping or managing your Housekeeping? Y   Comment family helps     Patient Care Team: Sherlene Shams, MD as PCP - General (Internal Medicine)  Indicate any recent Medical Services you may have received from other than [facility] providers in the past year (date may be approximate).     Assessment:   This is a routine wellness examination for Jacalyn.  Hearing/Vision screen Hearing Screening - Comments:: Some issues Vision Screening - Comments:: glasses   Goals  Addressed             This Visit's Progress    Patient Stated       Wants to try to get out more after she heals from her broken leg.       Depression Screen    11/04/2022    3:16 PM 08/21/2022    9:57 AM 10/30/2021    4:53 PM 09/25/2021   12:20 PM 09/14/2020   11:14 AM 08/17/2020   12:38 PM 06/09/2020    3:27 PM  PHQ 2/9 Scores  PHQ - 2 Score 1 4 2 4 2  0 1  PHQ- 9 Score 3 8 8 11 7       Fall Risk    11/04/2022    3:10 PM 08/21/2022    9:18 AM 10/30/2021    4:00 PM 09/25/2021   11:43 AM 03/15/2021   10:18 AM  Fall Risk   Falls in the past year? 1 1 -- 1 1  Comment   No fall since last reported than 1 month ago    Number falls in past yr: 1 1  1 1   Injury with Fall? 1 1  0 1  Comment broke femur right leg      Risk for fall due to : History of fall(s);Impaired balance/gait History of fall(s) Impaired balance/gait;History of fall(s) History of fall(s) History of fall(s)  Risk for fall due to: Comment caused by a dog  Cane in use when ambulating    Follow up Falls evaluation completed;Falls prevention discussed Falls evaluation completed  Falls evaluation completed Falls evaluation completed    MEDICARE RISK AT HOME: Medicare Risk at Home Any stairs in or around the home?: Yes If so, are there any without handrails?: Yes Home free of loose throw rugs in walkways, pet beds, electrical cords, etc?: Yes Adequate lighting in your home to reduce risk of falls?: Yes Life alert?: No Use of a cane, walker or w/c?: Yes (walker) Grab bars in the bathroom?: No Shower chair or  bench in shower?: Yes Elevated toilet seat or a handicapped toilet?: Yes      Cognitive Function:    05/26/2017   11:42 AM 05/23/2016   12:03 PM  MMSE - Mini Mental State Exam  Orientation to time 5 5  Orientation to Place 5 5  Registration 3 3  Attention/ Calculation 5 2  Attention/Calculation-comments  Difficulty performing simple calculations  Recall 2 3  Language- name 2 objects 2 2  Language-  repeat 1 1  Language- follow 3 step command 3 3  Language- read & follow direction 1 1  Write a sentence 1 1  Copy design 1 1  Total score 29 27        11/04/2022    3:26 PM 10/30/2021    4:01 PM 06/02/2019   11:51 AM 05/28/2018   12:41 PM  6CIT Screen  What Year? 0 points 0 points 0 points 0 points  What month? 0 points 0 points 0 points 0 points  What time? 0 points 0 points 0 points 0 points  Count back from 20 0 points 0 points  0 points  Months in reverse 2 points 0 points 0 points 0 points  Repeat phrase 0 points 2 points  0 points  Total Score 2 points 2 points  0 points    Immunizations Immunization History  Administered Date(s) Administered   Fluad Quad(high Dose 65+) 11/11/2018, 10/01/2019, 10/05/2021   Influenza, High Dose Seasonal PF 12/08/2012, 02/10/2015, 02/07/2016, 11/11/2016, 11/19/2017   Pneumococcal Conjugate-13 12/08/2012   Pneumococcal Polysaccharide-23 10/22/2008, 02/10/2015   Zoster, Live 12/08/2009    TDAP status: Due, Education has been provided regarding the importance of this vaccine. Advised may receive this vaccine at local pharmacy or Health Dept. Aware to provide a copy of the vaccination record if obtained from local pharmacy or Health Dept. Verbalized acceptance and understanding.  Flu Vaccine status: Due, Education has been provided regarding the importance of this vaccine. Advised may receive this vaccine at local pharmacy or Health Dept. Aware to provide a copy of the vaccination record if obtained from local pharmacy or Health Dept. Verbalized acceptance and understanding.  Pneumococcal vaccine status: Up to date  Covid-19 vaccine status: Information provided on how to obtain vaccines.   Qualifies for Shingles Vaccine? Yes   Zostavax completed Yes   Shingrix Completed?: No.    Education has been provided regarding the importance of this vaccine. Patient has been advised to call insurance company to determine out of pocket expense if they  have not yet received this vaccine. Advised may also receive vaccine at local pharmacy or Health Dept. Verbalized acceptance and understanding.  Screening Tests Health Maintenance  Topic Date Due   COVID-19 Vaccine (1) Never done   DTaP/Tdap/Td (1 - Tdap) Never done   Zoster Vaccines- Shingrix (1 of 2) 10/26/1964   Diabetic kidney evaluation - Urine ACR  03/16/2022   FOOT EXAM  09/26/2022   Medicare Annual Wellness (AWV)  10/31/2022   OPHTHALMOLOGY EXAM  01/07/2023 (Originally 12/16/2013)   INFLUENZA VACCINE  04/07/2023 (Originally 08/08/2022)   MAMMOGRAM  01/07/2024 (Originally 05/29/2017)   Colonoscopy  01/07/2024 (Originally 07/17/2019)   HEMOGLOBIN A1C  01/18/2023   Diabetic kidney evaluation - eGFR measurement  08/21/2023   Pneumonia Vaccine 59+ Years old  Completed   DEXA SCAN  Completed   Hepatitis C Screening  Completed   HPV VACCINES  Aged Out    Health Maintenance  Health Maintenance Due  Topic Date Due  COVID-19 Vaccine (1) Never done   DTaP/Tdap/Td (1 - Tdap) Never done   Zoster Vaccines- Shingrix (1 of 2) 10[birth year]26/1964   Diabetic kidney evaluation - Urine ACR  03/16/2022   FOOT EXAM  09/26/2022   Medicare Annual Wellness (AWV)  10/31/2022    Colorectal cancer screening: Type of screening: Colonoscopy. Completed 07/2016. Repeat every 3 years Patient is recovering from surgery and will consider   Mammogram status: Completed 05/2016. Repeat every year Patient declines at this time  Bone Density status: Completed 07/2014. Results reflect: Bone density results: OSTEOPENIA. Repeat every 2 years.  Lung Cancer Screening: (Low Dose CT Chest recommended if Age 31-80 years, 20 pack-year currently smoking OR have quit w/in 15years.) does not qualify.     Additional Screening:  Hepatitis C Screening: does qualify; Completed 02/2015  Vision Screening: Recommended annual ophthalmology exams for early detection of glaucoma and other disorders of the eye. Is the patient up to date  with their annual eye exam?  No  Who is the provider or what is the name of the office in which the patient attends annual eye exams? Has to check with insurance and will schedule an appointment If pt is not established with a provider, would they like to be referred to a provider to establish care? No .   Dental Screening: Recommended annual dental exams for proper oral hygiene  Diabetic Foot Exam: Diabetic Foot Exam: Overdue, Pt has been advised about the importance in completing this exam. Pt is scheduled for diabetic foot exam on Patient will need at next office visit and documented..  Community Resource Referral / Chronic Care Management: CRR required this visit?  No   CCM required this visit?  No     Plan:     I have personally reviewed and noted the following in the patient's chart:   Medical and social history Use of alcohol, tobacco or illicit drugs  Current medications and supplements including opioid prescriptions. Patient is not currently taking opioid prescriptions. Functional ability and status Nutritional status Physical activity Advanced directives List of other physicians Hospitalizations, surgeries, and ER visits in previous 12 months Vitals Screenings to include cognitive, depression, and falls Referrals and appointments  In addition, I have reviewed and discussed with patient certain preventive protocols, quality metrics, and best practice recommendations. A written personalized care plan for preventive services as well as general preventive health recommendations were provided to patient.     Sydell Axon, LPN   16/10/9602   After Visit Summary: (MyChart) Due to this being a telephonic visit, the after visit summary with patients personalized plan was offered to patient via MyChart   Nurse Notes: None

## 2022-11-21 ENCOUNTER — Ambulatory Visit: Payer: Medicare HMO | Admitting: Internal Medicine

## 2022-11-24 ENCOUNTER — Other Ambulatory Visit: Payer: Self-pay | Admitting: Internal Medicine

## 2022-12-01 ENCOUNTER — Other Ambulatory Visit: Payer: Self-pay | Admitting: Internal Medicine

## 2022-12-07 ENCOUNTER — Other Ambulatory Visit: Payer: Self-pay | Admitting: Internal Medicine

## 2022-12-19 ENCOUNTER — Other Ambulatory Visit: Payer: Self-pay | Admitting: Internal Medicine

## 2022-12-20 ENCOUNTER — Telehealth: Payer: Self-pay

## 2022-12-20 ENCOUNTER — Other Ambulatory Visit: Payer: Self-pay | Admitting: Internal Medicine

## 2022-12-20 MED ORDER — AMITRIPTYLINE HCL 50 MG PO TABS: ORAL_TABLET | ORAL | 0 refills | Status: DC

## 2022-12-20 NOTE — Telephone Encounter (Signed)
Patient states her pharmacy, [facility] in [place], sent a message telling her that Dr. Duncan Dull did not order her amitriptyline.  Prescription Request  12/20/2022  LOV: Visit date not found  What is the name of the medication or equipment? amitriptyline  Have you contacted your pharmacy to request a refill? Yes   Which pharmacy would you like this sent to?  [id number] - [place], [place] - [address] [place] [address] Phone: [phone] Fax: [phone]   Patient notified that their request is being sent to the clinical staff for review and that they should receive a response within 2 business days.   Please advise at [facility] [phone]  Patient states she is out of this medication.

## 2022-12-20 NOTE — Telephone Encounter (Signed)
Amtriptyline is not in pt's current medication list.

## 2022-12-20 NOTE — Telephone Encounter (Signed)
Pt is aware.  

## 2023-02-25 DIAGNOSIS — G4733 Obstructive sleep apnea (adult) (pediatric): Secondary | ICD-10-CM | POA: Diagnosis not present

## 2023-03-08 ENCOUNTER — Other Ambulatory Visit: Payer: Self-pay | Admitting: Internal Medicine

## 2023-03-25 DIAGNOSIS — G4733 Obstructive sleep apnea (adult) (pediatric): Secondary | ICD-10-CM | POA: Diagnosis not present

## 2023-04-03 ENCOUNTER — Other Ambulatory Visit: Payer: Self-pay | Admitting: Internal Medicine

## 2023-04-07 NOTE — Telephone Encounter (Signed)
 I tried to reach patient to schedule a follow-up with PCP. Last OV was in August was AVS stated:  Return in about 3 months (around 11/21/2022).  I will send in a 30 day supply, but pt needs a OV.  Mailbox was full and unable to leave a message.

## 2023-04-25 DIAGNOSIS — G4733 Obstructive sleep apnea (adult) (pediatric): Secondary | ICD-10-CM | POA: Diagnosis not present

## 2023-05-14 ENCOUNTER — Other Ambulatory Visit: Payer: Self-pay | Admitting: Internal Medicine

## 2023-05-21 DIAGNOSIS — G4733 Obstructive sleep apnea (adult) (pediatric): Secondary | ICD-10-CM | POA: Diagnosis not present

## 2023-05-25 DIAGNOSIS — G4733 Obstructive sleep apnea (adult) (pediatric): Secondary | ICD-10-CM | POA: Diagnosis not present

## 2023-06-01 ENCOUNTER — Other Ambulatory Visit: Payer: Self-pay | Admitting: Internal Medicine

## 2023-06-02 ENCOUNTER — Other Ambulatory Visit: Payer: Self-pay | Admitting: Internal Medicine

## 2023-06-06 ENCOUNTER — Other Ambulatory Visit: Payer: Self-pay | Admitting: Internal Medicine

## 2023-06-15 ENCOUNTER — Other Ambulatory Visit: Payer: Self-pay | Admitting: Internal Medicine

## 2023-06-25 DIAGNOSIS — G4733 Obstructive sleep apnea (adult) (pediatric): Secondary | ICD-10-CM | POA: Diagnosis not present

## 2023-06-30 ENCOUNTER — Other Ambulatory Visit: Payer: Self-pay | Admitting: Internal Medicine

## 2023-07-08 DIAGNOSIS — G4733 Obstructive sleep apnea (adult) (pediatric): Secondary | ICD-10-CM | POA: Diagnosis not present

## 2023-07-29 ENCOUNTER — Other Ambulatory Visit: Payer: Self-pay | Admitting: Internal Medicine

## 2023-08-01 NOTE — Telephone Encounter (Signed)
 Called pt to get pt scheduled an appt as hasn't been seen since 8/24  voicemail box is full unable to leave a vm

## 2023-08-08 ENCOUNTER — Other Ambulatory Visit: Payer: Self-pay | Admitting: Internal Medicine

## 2023-08-08 DIAGNOSIS — G4733 Obstructive sleep apnea (adult) (pediatric): Secondary | ICD-10-CM | POA: Diagnosis not present

## 2023-08-11 ENCOUNTER — Other Ambulatory Visit: Payer: Self-pay | Admitting: Internal Medicine

## 2023-08-15 ENCOUNTER — Other Ambulatory Visit: Payer: Self-pay | Admitting: Internal Medicine

## 2023-08-19 DIAGNOSIS — G4733 Obstructive sleep apnea (adult) (pediatric): Secondary | ICD-10-CM | POA: Diagnosis not present

## 2023-08-21 DIAGNOSIS — G4733 Obstructive sleep apnea (adult) (pediatric): Secondary | ICD-10-CM | POA: Diagnosis not present

## 2023-08-26 ENCOUNTER — Encounter: Payer: Self-pay | Admitting: Internal Medicine

## 2023-08-26 ENCOUNTER — Ambulatory Visit: Admitting: Internal Medicine

## 2023-08-26 VITALS — BP 162/74 | HR 69 | Ht 64.0 in | Wt 194.8 lb

## 2023-08-26 DIAGNOSIS — E1159 Type 2 diabetes mellitus with other circulatory complications: Secondary | ICD-10-CM

## 2023-08-26 DIAGNOSIS — R4189 Other symptoms and signs involving cognitive functions and awareness: Secondary | ICD-10-CM

## 2023-08-26 DIAGNOSIS — Z8639 Personal history of other endocrine, nutritional and metabolic disease: Secondary | ICD-10-CM | POA: Diagnosis not present

## 2023-08-26 DIAGNOSIS — E785 Hyperlipidemia, unspecified: Secondary | ICD-10-CM | POA: Diagnosis not present

## 2023-08-26 DIAGNOSIS — I152 Hypertension secondary to endocrine disorders: Secondary | ICD-10-CM

## 2023-08-26 DIAGNOSIS — H903 Sensorineural hearing loss, bilateral: Secondary | ICD-10-CM

## 2023-08-26 DIAGNOSIS — Z8781 Personal history of (healed) traumatic fracture: Secondary | ICD-10-CM | POA: Diagnosis not present

## 2023-08-26 DIAGNOSIS — G4733 Obstructive sleep apnea (adult) (pediatric): Secondary | ICD-10-CM

## 2023-08-26 DIAGNOSIS — R2681 Unsteadiness on feet: Secondary | ICD-10-CM | POA: Diagnosis not present

## 2023-08-26 DIAGNOSIS — N1831 Chronic kidney disease, stage 3a: Secondary | ICD-10-CM

## 2023-08-26 DIAGNOSIS — F32A Depression, unspecified: Secondary | ICD-10-CM

## 2023-08-26 DIAGNOSIS — R5383 Other fatigue: Secondary | ICD-10-CM | POA: Diagnosis not present

## 2023-08-26 LAB — CBC WITH DIFFERENTIAL/PLATELET
Basophils Absolute: 0 K/uL (ref 0.0–0.1)
Basophils Relative: 0.4 % (ref 0.0–3.0)
Eosinophils Absolute: 0.1 K/uL (ref 0.0–0.7)
Eosinophils Relative: 0.8 % (ref 0.0–5.0)
HCT: 39.1 % (ref 36.0–46.0)
Hemoglobin: 12.7 g/dL (ref 12.0–15.0)
Lymphocytes Relative: 29.1 % (ref 12.0–46.0)
Lymphs Abs: 2.1 K/uL (ref 0.7–4.0)
MCHC: 32.5 g/dL (ref 30.0–36.0)
MCV: 92.5 fl (ref 78.0–100.0)
Monocytes Absolute: 0.7 K/uL (ref 0.1–1.0)
Monocytes Relative: 8.9 % (ref 3.0–12.0)
Neutro Abs: 4.5 K/uL (ref 1.4–7.7)
Neutrophils Relative %: 60.8 % (ref 43.0–77.0)
Platelets: 247 K/uL (ref 150.0–400.0)
RBC: 4.22 Mil/uL (ref 3.87–5.11)
RDW: 13.3 % (ref 11.5–15.5)
WBC: 7.3 K/uL (ref 4.0–10.5)

## 2023-08-26 LAB — LIPID PANEL
Cholesterol: 158 mg/dL (ref 0–200)
HDL: 79.4 mg/dL (ref >39.00)
LDL Cholesterol: 60 mg/dL (ref 0–99)
NonHDL: 78.64
Total CHOL/HDL Ratio: 2
Triglycerides: 91 mg/dL (ref 0.0–149.0)
VLDL: 18.2 mg/dL (ref 0.0–40.0)

## 2023-08-26 LAB — MICROALBUMIN / CREATININE URINE RATIO
Creatinine,U: 166.3 mg/dL
Microalb Creat Ratio: UNDETERMINED mg/g (ref 0.0–30.0)
Microalb, Ur: <0.7 mg/dL

## 2023-08-26 LAB — TSH: TSH: 4.34 u[IU]/mL (ref 0.35–5.50)

## 2023-08-26 LAB — COMPREHENSIVE METABOLIC PANEL WITH GFR
ALT: 14 U/L (ref 0–35)
AST: 18 U/L (ref 0–37)
Albumin: 4.2 g/dL (ref 3.5–5.2)
Alkaline Phosphatase: 91 U/L (ref 39–117)
BUN: 26 mg/dL — ABNORMAL HIGH (ref 6–23)
CO2: 30 meq/L (ref 19–32)
Calcium: 9.2 mg/dL (ref 8.4–10.5)
Chloride: 105 meq/L (ref 96–112)
Creatinine, Ser: 1.32 mg/dL — ABNORMAL HIGH (ref 0.40–1.20)
GFR: 39.13 mL/min — ABNORMAL LOW (ref >60.00)
Glucose, Bld: 98 mg/dL (ref 70–99)
Potassium: 4.1 meq/L (ref 3.5–5.1)
Sodium: 143 meq/L (ref 135–145)
Total Bilirubin: 0.6 mg/dL (ref 0.2–1.2)
Total Protein: 6.4 g/dL (ref 6.0–8.3)

## 2023-08-26 LAB — B12 AND FOLATE PANEL
Folate: >23.4 ng/mL (ref >5.9)
Vitamin B-12: 264 pg/mL (ref 211–911)

## 2023-08-26 LAB — HEMOGLOBIN A1C: Hgb A1c MFr Bld: 5.7 % (ref 4.6–6.5)

## 2023-08-26 LAB — LDL CHOLESTEROL, DIRECT: Direct LDL: 62 mg/dL

## 2023-08-26 MED ORDER — AMITRIPTYLINE HCL 50 MG PO TABS: ORAL_TABLET | ORAL | 1 refills | Status: AC

## 2023-08-26 MED ORDER — BUPROPION HCL ER (SR) 150 MG PO TB12: 150.0000 mg | ORAL_TABLET | Freq: Two times a day (BID) | ORAL | 1 refills | Status: AC

## 2023-08-26 MED ORDER — LOSARTAN POTASSIUM-HCTZ 50-12.5 MG PO TABS: 1.0000 | ORAL_TABLET | Freq: Every day | ORAL | 1 refills | Status: AC

## 2023-08-26 MED ORDER — APIXABAN 2.5 MG PO TABS: 2.5000 mg | ORAL_TABLET | Freq: Two times a day (BID) | ORAL | 1 refills | Status: DC

## 2023-08-26 MED ORDER — SERTRALINE HCL 100 MG PO TABS: ORAL_TABLET | ORAL | 1 refills | Status: DC

## 2023-08-26 MED ORDER — ATORVASTATIN CALCIUM 10 MG PO TABS: 10.0000 mg | ORAL_TABLET | Freq: Every day | ORAL | 1 refills | Status: AC

## 2023-08-26 MED ORDER — TRAZODONE HCL 100 MG PO TABS: 100.0000 mg | ORAL_TABLET | Freq: Two times a day (BID) | ORAL | 1 refills | Status: AC

## 2023-08-26 NOTE — Progress Notes (Signed)
 Subjective:  Patient ID: Jodi [name], female    DOB: December 02, 1945  Age: 78 y.o. MRN: [id number]  CC: The primary encounter diagnosis was Hypertension associated with diabetes (HCC). Diagnoses of Other fatigue, Hyperlipidemia, unspecified hyperlipidemia type, History of femur fracture, Sensorineural hearing loss (SNHL) of both ears, OSA (obstructive sleep apnea), Personal history of diabetes mellitus, Cognitive changes, Gait instability, Stage 3a chronic kidney disease (HCC), and Chronic depressive disorder were also pertinent to this visit.   HPI Jodi [name] presents for  Chief Complaint  Patient presents with   Medical Management of Chronic Issues   Last seen August 2024, accompanied by son, today she is accompanied by her daughter   1) HTN:: NOT CHECKING BP AT HOME but has a machine that she can't fasten around her arm .    taking losartan  hct last refill June 2025   2) cognitive changes:  duaghter worried about her memory.  Repeats herself , not in the same conversation.  Patient has good recall today of    3) CKD stage 3: last nephrology follow up April 2024 with Lateef   4) history of right femur fracture  fracture July 2024.  PT ordered at last visit since Ortho did not.  Still taking Eliquis  for increased risk of DVT.  Current ambulation status:  sedentary, stays in the house,  uses a cane . Able to do laundry but not cleaning/vacuum.  Cannot drive.   Discussed home PT for balance and gait training   5) ? Anemia of acute blood loss:     Hgb dropped to 7.0 post operatively from orthopedic surgery, required transfusion  last known hgb was near normal in August  6) OSA:  chronically untreated  repeat study done last Sept and CPAP ordered.  No longer Wearing because sh states that mask was no longer working right.  She called the supplier but nothing has happened  DISCUSS REFERRAL TO SLEEP SPECIALISTS.   7) HARD OF HEARING   NEEDS REFERRAL TO [facility] AND [facility]        Lab Results  Component Value Date   WBC 7.3 08/26/2023   HGB 12.7 08/26/2023   HCT 39.1 08/26/2023   MCV 92.5 08/26/2023   PLT 247.0 08/26/2023     Outpatient Medications Prior to Visit  Medication Sig Dispense Refill   acetaminophen  (TYLENOL ) 500 MG tablet Take 1,000 mg by mouth 2 (two) times daily as needed for moderate pain, fever or headache.     docusate sodium  (COLACE) 100 MG capsule Take 1 capsule (100 mg total) by mouth 2 (two) times daily.     magnesium  oxide (MAG-OX) 400 (240 Mg) MG tablet Take 1 tablet (400 mg total) by mouth daily.     Multiple Vitamins-Minerals (CENTRUM SILVER 50+WOMEN) TABS Take 1 tablet by mouth daily.     polyethylene glycol (MIRALAX  / GLYCOLAX ) 17 g packet Take 17 g by mouth daily as needed for mild constipation.     amitriptyline  (ELAVIL ) 50 MG tablet TAKE 1 TABLET BY MOUTH EVERYDAY AT BEDTIME 30 tablet 0   apixaban  (ELIQUIS ) 2.5 MG TABS tablet TAKE 1 TABLET BY MOUTH TWICE A DAY 60 tablet 0   atorvastatin  (LIPITOR) 10 MG tablet TAKE 1 TABLET BY MOUTH EVERY DAY 30 tablet 0   buPROPion  (WELLBUTRIN  SR) 150 MG 12 hr tablet TAKE 1 TABLET BY MOUTH TWICE A DAY 30 tablet 0   gabapentin  (NEURONTIN ) 100 MG capsule TAKE 1 CAPSULE BY MOUTH THREE TIMES A  DAY (Patient not taking: Reported on 08/26/2023) 90 capsule 5   losartan -hydrochlorothiazide  (HYZAAR) 50-12.5 MG tablet TAKE 1 TABLET BY MOUTH EVERY DAY 30 tablet 1   sertraline  (ZOLOFT ) 100 MG tablet TAKE 1.5 TABLETS (150MG  TOTAL) BY MOUTH DAILY 135 tablet 0   traZODone  (DESYREL ) 100 MG tablet TAKE 1 TABLET BY MOUTH TWICE A DAY 60 tablet 0   No facility-administered medications prior to visit.    Review of Systems;  Patient denies headache, fevers, malaise, unintentional weight loss, skin rash, eye pain, sinus congestion and sinus pain, sore throat, dysphagia,  hemoptysis , cough, dyspnea, wheezing, chest pain, palpitations, orthopnea, edema, abdominal pain, nausea, melena, diarrhea, constipation,  flank pain, dysuria, hematuria, urinary  Frequency, nocturia, numbness, tingling, seizures,  Focal weakness, Loss of consciousness,  Tremor, insomnia, depression, anxiety, and suicidal ideation.      Objective:  BP (!) 162/74   Pulse 69   Ht 5' 4 (1.626 m)   Wt 194 lb 12.8 oz (88.4 kg)   SpO2 97%   BMI 33.44 kg/m   BP Readings from Last 3 Encounters:  08/26/23 (!) 162/74  08/21/22 122/60  07/23/22 (!) 104/40    Wt Readings from Last 3 Encounters:  08/26/23 194 lb 12.8 oz (88.4 kg)  11/04/22 203 lb (92.1 kg)  08/21/22 215 lb (97.5 kg)    Physical Exam Vitals reviewed.  Constitutional:      General: She is not in acute distress.    Appearance: Normal appearance. She is normal weight. She is not ill-appearing, toxic-appearing or diaphoretic.  HENT:     Head: Normocephalic.  Eyes:     General: No scleral icterus.       Right eye: No discharge.        Left eye: No discharge.     Conjunctiva/sclera: Conjunctivae normal.  Cardiovascular:     Rate and Rhythm: Normal rate and regular rhythm.     Heart sounds: Normal heart sounds.  Pulmonary:     Effort: Pulmonary effort is normal. No respiratory distress.     Breath sounds: Normal breath sounds.  Musculoskeletal:        General: Normal range of motion.  Skin:    General: Skin is warm and dry.  Neurological:     General: No focal deficit present.     Mental Status: She is alert and oriented to person, place, and time. Mental status is at baseline.  Psychiatric:        Mood and Affect: Mood normal.        Behavior: Behavior normal.        Thought Content: Thought content normal.        Judgment: Judgment normal.     Lab Results  Component Value Date   HGBA1C 5.7 08/26/2023   HGBA1C 5.3 07/18/2022   HGBA1C 6.0 09/25/2021    Lab Results  Component Value Date   CREATININE 1.32 (H) 08/26/2023   CREATININE 1.09 08/21/2022   CREATININE 0.79 07/23/2022    Lab Results  Component Value Date   WBC 7.3 08/26/2023    HGB 12.7 08/26/2023   HCT 39.1 08/26/2023   PLT 247.0 08/26/2023   GLUCOSE 98 08/26/2023   CHOL 158 08/26/2023   TRIG 91.0 08/26/2023   HDL 79.40 08/26/2023   LDLDIRECT 62.0 08/26/2023   LDLCALC 60 08/26/2023   ALT 14 08/26/2023   AST 18 08/26/2023   NA 143 08/26/2023   K 4.1 08/26/2023   CL 105 08/26/2023   CREATININE 1.32 (  H) 08/26/2023   BUN 26 (H) 08/26/2023   CO2 30 08/26/2023   TSH 4.34 08/26/2023   INR 1.1 07/18/2022   HGBA1C 5.7 08/26/2023   MICROALBUR <0.7 08/26/2023    DG FEMUR PORT, MIN 2 VIEWS RIGHT Result Date: 07/19/2022 CLINICAL DATA:  Follow-up femur fracture EXAM: RIGHT FEMUR PORTABLE 2 VIEW COMPARISON:  07/17/2022 FINDINGS: Status post surgical plate and multiple screw fixation of acute comminuted fracture involving the distal femoral shaft and extending to level of right knee prosthesis. Decreased fracture displacement and angulation. Residual long 17 mm displaced medial shaft fracture fragment with 2 most distal screw tips partially anchored within IMPRESSION: Status post ORIF of distal femoral shaft fracture with decreased displacement and angulation. Electronically Signed   By: Luke Bun M.D.   On: 07/19/2022 15:23   DG FEMUR, MIN 2 VIEWS RIGHT Result Date: 07/19/2022 CLINICAL DATA:  Open reduction internal fixation EXAM: RIGHT FEMUR 2 VIEWS COMPARISON:  07/17/2022 FINDINGS: Seven low resolution intraoperative spot views of the right femur. Total fluoroscopy time was 1 minutes 50 seconds, fluoroscopic dose of 15.43 mGy. The images demonstrate a right knee replacement. There is placement of surgical plate and multiple fixating screws across comminuted distal femoral periprosthetic fracture. IMPRESSION: Intraoperative fluoroscopic assistance provided during ORIF of distal femoral periprosthetic fracture. Electronically Signed   By: Luke Bun M.D.   On: 07/19/2022 15:20   DG C-Arm 1-60 Min-No Report Result Date: 07/19/2022 Fluoroscopy was utilized by the  requesting physician.  No radiographic interpretation.    Assessment & Plan:  .Hypertension associated with diabetes (HCC) -     Comprehensive metabolic panel with GFR -     Hemoglobin A1c -     Microalbumin / creatinine urine ratio  Other fatigue -     CBC with Differential/Platelet -     TSH  Hyperlipidemia, unspecified hyperlipidemia type -     Lipid panel -     LDL cholesterol, direct  History of femur fracture -     DG Bone Density; Future  Sensorineural hearing loss (SNHL) of both ears -     Ambulatory referral to ENT  OSA (obstructive sleep apnea) Assessment & Plan: Severe ,by repeat home sleep study,  with desats < 90% .  CPAP and supplemental oxygen ordered but not used currently due to problems with fit.  Referring to pulmonary for OSA management   Orders: -     Pulmonary Visit  Personal history of diabetes mellitus Assessment & Plan: A1c remains normal   Lab Results  Component Value Date   HGBA1C 5.7 08/26/2023     Orders: -     Ambulatory referral to ENT -     Ambulatory referral to Ophthalmology  Cognitive changes -     B12 and Folate Panel  Gait instability Assessment & Plan: Resulting in multiple falls and fractures..  she has become sedentary and fearful of falls..  home health PT ordered to improve balance and strength  Orders: -     Ambulatory referral to Home Health  Stage 3a chronic kidney disease [facility]) Assessment & Plan: Managed by Mumsoor Lateef ,  [facility] [place] [facility],  last OV April 2024  .  GFR is stable and lytes are normal..  She is avoiding NSAIDS.  Lab Results  Component Value Date   CREATININE 1.32 (H) 08/26/2023   Lab Results  Component Value Date   NA 143 08/26/2023   K 4.1 08/26/2023   CL 105 08/26/2023   CO2 30  08/26/2023   Lab Results  Component Value Date   MICROALBUR <0.7 08/26/2023      Chronic depressive disorder Assessment & Plan:   She is taking 150 mg twice daily  wellbutrin  and q 150 mg daily   sertraline     Other orders -     Amitriptyline  HCl; TAKE 1 TABLET BY MOUTH EVERYDAY AT BEDTIMETAKE 1 TABLET BY MOUTH EVERYDAY AT BEDTIME  Dispense: 90 tablet; Refill: 1 -     Apixaban ; Take 1 tablet (2.5 mg total) by mouth 2 (two) times daily.  Dispense: 180 tablet; Refill: 1 -     Atorvastatin  Calcium ; Take 1 tablet (10 mg total) by mouth daily.  Dispense: 90 tablet; Refill: 1 -     buPROPion  HCl ER (SR); Take 1 tablet (150 mg total) by mouth 2 (two) times daily.  Dispense: 180 tablet; Refill: 1 -     Losartan  Potassium-HCTZ; Take 1 tablet by mouth daily.  Dispense: 90 tablet; Refill: 1 -     Sertraline  HCl; TAKE 1.5 TABLETS (150MG  TOTAL) BY MOUTH DAILY  Dispense: 135 tablet; Refill: 1 -     traZODone  HCl; Take 1 tablet (100 mg total) by mouth 2 (two) times daily.  Dispense: 180 tablet; Refill: 1    I personally spent a total of 50 minutes in the care of the patient today including preparing to see the patient, getting/reviewing separately obtained history, performing a medically appropriate exam/evaluation, counseling and educating, placing orders, referring and communicating with other health care professionals, documenting clinical information in the EHR, and independently interpreting results.  Follow-up: Return in about 3 months (around 11/26/2023).   Jodi LITTIE Kettering, MD

## 2023-08-26 NOTE — Assessment & Plan Note (Signed)
 A1c remains normal   Lab Results  Component Value Date   HGBA1C 5.7 08/26/2023

## 2023-08-26 NOTE — Patient Instructions (Signed)
 STOP TAKING ELIQUIS ,  NO TAPER OR WEAN  IS NEEDED   REFERRALS FOR   HOME PHYSICAL THERAPY SLEEP APNEA MANAGEMENT  [facility] FOR HEARING  [facility] FOR VISION TEST       Your annual mammogram AND  DEXA  SCAN have been ordered.  Please call [facility] to call to make your appointments  .  The phone number for [facility] is  [phone]

## 2023-08-26 NOTE — Assessment & Plan Note (Signed)
 She is taking 150 mg twice daily  wellbutrin  and q 150 mg daily  sertraline 

## 2023-08-26 NOTE — Assessment & Plan Note (Signed)
 Severe ,by repeat home sleep study,  with desats < 90% .  CPAP and supplemental oxygen ordered but not used currently due to problems with fit.  Referring to pulmonary for OSA management

## 2023-08-26 NOTE — Assessment & Plan Note (Addendum)
 Managed by Mumsoor Lateef ,  [facility] [place] [facility],  last OV April 2024  .  GFR is stable and lytes are normal..  She is avoiding NSAIDS.  Lab Results  Component Value Date   CREATININE 1.32 (H) 08/26/2023   Lab Results  Component Value Date   NA 143 08/26/2023   K 4.1 08/26/2023   CL 105 08/26/2023   CO2 30 08/26/2023   Lab Results  Component Value Date   MICROALBUR <0.7 08/26/2023

## 2023-08-26 NOTE — Assessment & Plan Note (Signed)
 Resulting in multiple falls and fractures..  she has become sedentary and fearful of falls..  home health PT ordered to improve balance and strength

## 2023-08-28 ENCOUNTER — Ambulatory Visit: Payer: Self-pay | Admitting: Internal Medicine

## 2023-09-01 ENCOUNTER — Telehealth: Payer: Self-pay | Admitting: Sleep Medicine

## 2023-09-01 DIAGNOSIS — E7849 Other hyperlipidemia: Secondary | ICD-10-CM | POA: Diagnosis not present

## 2023-09-01 DIAGNOSIS — E1122 Type 2 diabetes mellitus with diabetic chronic kidney disease: Secondary | ICD-10-CM | POA: Diagnosis not present

## 2023-09-01 DIAGNOSIS — G4733 Obstructive sleep apnea (adult) (pediatric): Secondary | ICD-10-CM | POA: Diagnosis not present

## 2023-09-01 DIAGNOSIS — H903 Sensorineural hearing loss, bilateral: Secondary | ICD-10-CM | POA: Diagnosis not present

## 2023-09-01 DIAGNOSIS — N1831 Chronic kidney disease, stage 3a: Secondary | ICD-10-CM | POA: Diagnosis not present

## 2023-09-01 DIAGNOSIS — I152 Hypertension secondary to endocrine disorders: Secondary | ICD-10-CM | POA: Diagnosis not present

## 2023-09-01 DIAGNOSIS — E1159 Type 2 diabetes mellitus with other circulatory complications: Secondary | ICD-10-CM | POA: Diagnosis not present

## 2023-09-01 DIAGNOSIS — Z7901 Long term (current) use of anticoagulants: Secondary | ICD-10-CM | POA: Diagnosis not present

## 2023-09-01 NOTE — Telephone Encounter (Signed)
 Patient to call back to schedule sleep consult.

## 2023-09-04 DIAGNOSIS — E1159 Type 2 diabetes mellitus with other circulatory complications: Secondary | ICD-10-CM | POA: Diagnosis not present

## 2023-09-04 DIAGNOSIS — G4733 Obstructive sleep apnea (adult) (pediatric): Secondary | ICD-10-CM | POA: Diagnosis not present

## 2023-09-04 DIAGNOSIS — E1122 Type 2 diabetes mellitus with diabetic chronic kidney disease: Secondary | ICD-10-CM | POA: Diagnosis not present

## 2023-09-04 DIAGNOSIS — N1831 Chronic kidney disease, stage 3a: Secondary | ICD-10-CM | POA: Diagnosis not present

## 2023-09-04 DIAGNOSIS — E7849 Other hyperlipidemia: Secondary | ICD-10-CM | POA: Diagnosis not present

## 2023-09-04 DIAGNOSIS — Z7901 Long term (current) use of anticoagulants: Secondary | ICD-10-CM | POA: Diagnosis not present

## 2023-09-04 DIAGNOSIS — I152 Hypertension secondary to endocrine disorders: Secondary | ICD-10-CM | POA: Diagnosis not present

## 2023-09-05 ENCOUNTER — Encounter: Payer: Self-pay | Admitting: Sleep Medicine

## 2023-09-05 ENCOUNTER — Ambulatory Visit (INDEPENDENT_AMBULATORY_CARE_PROVIDER_SITE_OTHER): Admitting: Sleep Medicine

## 2023-09-05 VITALS — BP 128/64 | HR 69 | Temp 97.5°F | Ht 64.0 in | Wt 198.8 lb

## 2023-09-05 DIAGNOSIS — G4733 Obstructive sleep apnea (adult) (pediatric): Secondary | ICD-10-CM | POA: Diagnosis not present

## 2023-09-05 DIAGNOSIS — I10 Essential (primary) hypertension: Secondary | ICD-10-CM

## 2023-09-05 NOTE — Progress Notes (Signed)
 Name:Jodi [name] MRN: [id number] DOB: 01/28/1945   CHIEF COMPLAINT:  ESTABLISH CARE FOR OSA   HISTORY OF PRESENT ILLNESS: Jodi [name] is a 78 y.o. w/ a h/o OSA, HTN, hyperlipidemia, anxiety, depression and obesity who presents to establish care for OSA. Reports that she was initially diagnosed with severe OSA (AHI 35, O2 nadir 80%) and was subsequently started on CPAP therapy. Reports difficulty using CPAP therapy due to pressure discomfort. She is currently using the Airfit F20 FFM, which causes discomfort. Reports nasal congestion. Reports nocturnal awakenings due to nocturia, however does not have difficulty falling back to sleep. Denies any significant weight changes. Admits to night sweats, dry mouth and morning headaches. Denies RLS symptoms, dream enactment, cataplexy, hypnagogic or hypnapompic hallucinations. Denies a family history of sleep apnea. Denies drowsy driving. Drinks caffeine occasionally, occasional alcohol use, denies tobacco or illicit drug use.   Bedtime 10-11 pm Sleep onset 10 mins Rise time 6 am   EPWORTH SLEEP SCORE 13    09/05/2023   10:43 AM  Results of the Epworth flowsheet  Sitting and reading 3  Watching TV 2  Sitting, inactive in a public place (e.g. a theatre or a meeting) 2  As a passenger in a car for an hour without a break 1  Lying down to rest in the afternoon when circumstances permit 2  Sitting and talking to someone 2  Sitting quietly after a lunch without alcohol 0  In a car, while stopped for a few minutes in traffic 1  Total score 13    PAST MEDICAL HISTORY :   has a past medical history of Anxiety, Arthritis, Diabetes mellitus without complication (HCC), Hypertension, and Sleep apnea.  has a past surgical history that includes Bladder suspension; Cholecystectomy; tracheotomy; Joint replacement; Roux-en-y procedure (2010); Colonoscopy with propofol  (N/A, 07/16/2016); and ORIF femur fracture (Right, 07/19/2022). Prior to  Admission medications   Medication Sig Start Date End Date Taking? Authorizing Provider  acetaminophen  (TYLENOL ) 500 MG tablet Take 1,000 mg by mouth 2 (two) times daily as needed for moderate pain, fever or headache.   Yes [provider]  amitriptyline  (ELAVIL ) 50 MG tablet TAKE 1 TABLET BY MOUTH EVERYDAY AT BEDTIMETAKE 1 TABLET BY MOUTH EVERYDAY AT BEDTIME 08/26/23  Yes Marylynn Verneita CROME, MD  apixaban  (ELIQUIS ) 2.5 MG TABS tablet Take 1 tablet (2.5 mg total) by mouth 2 (two) times daily. 08/26/23  Yes Marylynn Verneita CROME, MD  atorvastatin  (LIPITOR) 10 MG tablet Take 1 tablet (10 mg total) by mouth daily. 08/26/23  Yes Marylynn Verneita CROME, MD  buPROPion  (WELLBUTRIN  SR) 150 MG 12 hr tablet Take 1 tablet (150 mg total) by mouth 2 (two) times daily. 08/26/23  Yes Marylynn Verneita CROME, MD  docusate sodium  (COLACE) 100 MG capsule Take 1 capsule (100 mg total) by mouth 2 (two) times daily. 07/23/22  Yes Krishnan, Gokul, MD  losartan -hydrochlorothiazide  (HYZAAR) 50-12.5 MG tablet Take 1 tablet by mouth daily. 08/26/23  Yes Marylynn Verneita CROME, MD  magnesium  oxide (MAG-OX) 400 (240 Mg) MG tablet Take 1 tablet (400 mg total) by mouth daily. 07/23/22  Yes Verdene Purchase, MD  Multiple Vitamins-Minerals (CENTRUM SILVER 50+WOMEN) TABS Take 1 tablet by mouth daily.   Yes [provider]  polyethylene glycol (MIRALAX  / GLYCOLAX ) 17 g packet Take 17 g by mouth daily as needed for mild constipation. 07/23/22  Yes Krishnan, Gokul, MD  sertraline  (ZOLOFT ) 100 MG tablet TAKE 1.5 TABLETS (150MG  TOTAL) BY MOUTH DAILY  08/26/23  Yes Marylynn Verneita CROME, MD  traZODone  (DESYREL ) 100 MG tablet Take 1 tablet (100 mg total) by mouth 2 (two) times daily. 08/26/23  Yes Marylynn Verneita CROME, MD   Allergies  Allergen Reactions   Morphine And Codeine Nausea And Vomiting    FAMILY HISTORY:  family history includes Breast cancer in her daughter, paternal aunt, and paternal grandmother; COPD in her mother; Cancer in her sister; Hypertension in her  mother; Ovarian cancer (age of onset: 102) in her mother. SOCIAL HISTORY:  reports that she has never smoked. She has never used smokeless tobacco. She reports that she does not drink alcohol and does not use drugs.   Review of Systems:  Gen:  Denies  fever, sweats, chills weight loss  HEENT: Denies blurred vision, double vision, ear pain, eye pain, hearing loss, nose bleeds, sore throat Cardiac:  No dizziness, chest pain or heaviness, chest tightness,edema, No JVD Resp:   No cough, -sputum production, -shortness of breath,-wheezing, -hemoptysis,  Gi: Denies swallowing difficulty, stomach pain, nausea or vomiting, diarrhea, constipation, bowel incontinence Gu:  Denies bladder incontinence, burning urine Ext:   Denies Joint pain, stiffness or swelling Skin: Denies  skin rash, easy bruising or bleeding or hives Endoc:  Denies polyuria, polydipsia , polyphagia or weight change Psych:   Denies depression, insomnia or hallucinations  Other:  All other systems negative  VITAL SIGNS: BP 128/64   Pulse 69   Temp (!) 97.5 F (36.4 C)   Ht 5' 4 (1.626 m)   Wt 198 lb 12.8 oz (90.2 kg)   SpO2 99%   BMI 34.12 kg/m    Physical Examination:   General Appearance: No distress  EYES PERRLA, EOM intact.   NECK Supple, No JVD Pulmonary: normal breath sounds, No wheezing.  CardiovascularNormal S1,S2.  No m/r/g.   Abdomen: Benign, Soft, non-tender. Skin:   warm, no rashes, no ecchymosis  Extremities: normal, no cyanosis, clubbing. Neuro:without focal findings,  speech normal  PSYCHIATRIC: Mood, affect within normal limits.   ASSESSMENT AND PLAN  OSA Due to discomfort and elevated AHI with CPAP therapy, will complete CPAP/BIPAP titration study. For mask discomfort, provided patient with a sample Airfit F40 FFM. Discussed the consequences of untreated sleep apnea. Advised not to drive drowsy for safety of patient and others. Will complete further evaluation with titration study and will  follow up to review results.    HTN Stable, on current management. Following with PCP.    MEDICATION ADJUSTMENTS/LABS AND TESTS ORDERED: Recommend Sleep Study   Patient  satisfied with Plan of action and management. All questions answered  Follow up to review titration results and treatment plan.   I spent a total of 62 minutes reviewing chart data, face-to-face evaluation with the patient, counseling and coordination of care as detailed above.    Shaw Dobek, M.D.  Sleep Medicine [facility]

## 2023-09-05 NOTE — Patient Instructions (Signed)
 Will complete a CPAP titration study and follow up to review results.  Patient Instructions  Continue to use CPAP every night, minimum of 7-8 hours a night.  Change equipment every 30 days or as directed by DME.  Wash your tubing with warm soap and water weekly, hang to dry. Wash humidifier portion weekly. Use distilled water and change daily   Be aware of reduced alertness and do not drive or operate heavy machinery if experiencing this or drowsiness.  Exercise encouraged, as tolerated. Encouraged proper weight management.  Important to get eight or more hours of sleep  Limiting the use of the computer and television before bedtime.  Decrease naps during the day, so night time sleep will become enhanced.  Limit caffeine, and sleep deprivation.  HTN, stroke, uncontrolled diabetes and heart failure are potential risk factors.  Risk of untreated sleep apnea including cardiac arrhthymias, stroke, DM, pulm HTN.

## 2023-09-09 DIAGNOSIS — I152 Hypertension secondary to endocrine disorders: Secondary | ICD-10-CM | POA: Diagnosis not present

## 2023-09-09 DIAGNOSIS — E1159 Type 2 diabetes mellitus with other circulatory complications: Secondary | ICD-10-CM | POA: Diagnosis not present

## 2023-09-09 DIAGNOSIS — H903 Sensorineural hearing loss, bilateral: Secondary | ICD-10-CM | POA: Diagnosis not present

## 2023-09-09 DIAGNOSIS — N1831 Chronic kidney disease, stage 3a: Secondary | ICD-10-CM | POA: Diagnosis not present

## 2023-09-09 DIAGNOSIS — E7849 Other hyperlipidemia: Secondary | ICD-10-CM | POA: Diagnosis not present

## 2023-09-09 DIAGNOSIS — E1122 Type 2 diabetes mellitus with diabetic chronic kidney disease: Secondary | ICD-10-CM | POA: Diagnosis not present

## 2023-09-09 DIAGNOSIS — Z7901 Long term (current) use of anticoagulants: Secondary | ICD-10-CM | POA: Diagnosis not present

## 2023-09-09 DIAGNOSIS — G4733 Obstructive sleep apnea (adult) (pediatric): Secondary | ICD-10-CM | POA: Diagnosis not present

## 2023-09-11 DIAGNOSIS — Z7901 Long term (current) use of anticoagulants: Secondary | ICD-10-CM | POA: Diagnosis not present

## 2023-09-11 DIAGNOSIS — H903 Sensorineural hearing loss, bilateral: Secondary | ICD-10-CM | POA: Diagnosis not present

## 2023-09-11 DIAGNOSIS — E1122 Type 2 diabetes mellitus with diabetic chronic kidney disease: Secondary | ICD-10-CM | POA: Diagnosis not present

## 2023-09-11 DIAGNOSIS — I152 Hypertension secondary to endocrine disorders: Secondary | ICD-10-CM | POA: Diagnosis not present

## 2023-09-11 DIAGNOSIS — E1159 Type 2 diabetes mellitus with other circulatory complications: Secondary | ICD-10-CM | POA: Diagnosis not present

## 2023-09-11 DIAGNOSIS — E7849 Other hyperlipidemia: Secondary | ICD-10-CM | POA: Diagnosis not present

## 2023-09-11 DIAGNOSIS — G4733 Obstructive sleep apnea (adult) (pediatric): Secondary | ICD-10-CM | POA: Diagnosis not present

## 2023-09-11 DIAGNOSIS — N1831 Chronic kidney disease, stage 3a: Secondary | ICD-10-CM | POA: Diagnosis not present

## 2023-09-17 DIAGNOSIS — E1159 Type 2 diabetes mellitus with other circulatory complications: Secondary | ICD-10-CM | POA: Diagnosis not present

## 2023-09-17 DIAGNOSIS — Z7901 Long term (current) use of anticoagulants: Secondary | ICD-10-CM | POA: Diagnosis not present

## 2023-09-17 DIAGNOSIS — E7849 Other hyperlipidemia: Secondary | ICD-10-CM | POA: Diagnosis not present

## 2023-09-17 DIAGNOSIS — G4733 Obstructive sleep apnea (adult) (pediatric): Secondary | ICD-10-CM | POA: Diagnosis not present

## 2023-09-17 DIAGNOSIS — I152 Hypertension secondary to endocrine disorders: Secondary | ICD-10-CM | POA: Diagnosis not present

## 2023-09-17 DIAGNOSIS — H903 Sensorineural hearing loss, bilateral: Secondary | ICD-10-CM | POA: Diagnosis not present

## 2023-09-17 DIAGNOSIS — E1122 Type 2 diabetes mellitus with diabetic chronic kidney disease: Secondary | ICD-10-CM | POA: Diagnosis not present

## 2023-09-19 DIAGNOSIS — E1159 Type 2 diabetes mellitus with other circulatory complications: Secondary | ICD-10-CM | POA: Diagnosis not present

## 2023-09-19 DIAGNOSIS — N1831 Chronic kidney disease, stage 3a: Secondary | ICD-10-CM | POA: Diagnosis not present

## 2023-09-19 DIAGNOSIS — I152 Hypertension secondary to endocrine disorders: Secondary | ICD-10-CM | POA: Diagnosis not present

## 2023-09-19 DIAGNOSIS — Z7901 Long term (current) use of anticoagulants: Secondary | ICD-10-CM | POA: Diagnosis not present

## 2023-09-19 DIAGNOSIS — G4733 Obstructive sleep apnea (adult) (pediatric): Secondary | ICD-10-CM | POA: Diagnosis not present

## 2023-09-19 DIAGNOSIS — E7849 Other hyperlipidemia: Secondary | ICD-10-CM | POA: Diagnosis not present

## 2023-09-19 DIAGNOSIS — E1122 Type 2 diabetes mellitus with diabetic chronic kidney disease: Secondary | ICD-10-CM | POA: Diagnosis not present

## 2023-09-19 DIAGNOSIS — H903 Sensorineural hearing loss, bilateral: Secondary | ICD-10-CM | POA: Diagnosis not present

## 2023-09-19 DIAGNOSIS — F32A Depression, unspecified: Secondary | ICD-10-CM

## 2023-09-24 DIAGNOSIS — N1831 Chronic kidney disease, stage 3a: Secondary | ICD-10-CM | POA: Diagnosis not present

## 2023-09-24 DIAGNOSIS — I152 Hypertension secondary to endocrine disorders: Secondary | ICD-10-CM | POA: Diagnosis not present

## 2023-09-24 DIAGNOSIS — E1159 Type 2 diabetes mellitus with other circulatory complications: Secondary | ICD-10-CM | POA: Diagnosis not present

## 2023-09-24 DIAGNOSIS — H903 Sensorineural hearing loss, bilateral: Secondary | ICD-10-CM | POA: Diagnosis not present

## 2023-09-24 DIAGNOSIS — G4733 Obstructive sleep apnea (adult) (pediatric): Secondary | ICD-10-CM | POA: Diagnosis not present

## 2023-09-24 DIAGNOSIS — E7849 Other hyperlipidemia: Secondary | ICD-10-CM | POA: Diagnosis not present

## 2023-09-24 DIAGNOSIS — E1122 Type 2 diabetes mellitus with diabetic chronic kidney disease: Secondary | ICD-10-CM | POA: Diagnosis not present

## 2023-09-24 DIAGNOSIS — Z7901 Long term (current) use of anticoagulants: Secondary | ICD-10-CM | POA: Diagnosis not present

## 2023-09-25 DIAGNOSIS — G4733 Obstructive sleep apnea (adult) (pediatric): Secondary | ICD-10-CM | POA: Diagnosis not present

## 2023-09-29 DIAGNOSIS — E1122 Type 2 diabetes mellitus with diabetic chronic kidney disease: Secondary | ICD-10-CM | POA: Diagnosis not present

## 2023-09-29 DIAGNOSIS — Z7901 Long term (current) use of anticoagulants: Secondary | ICD-10-CM | POA: Diagnosis not present

## 2023-09-29 DIAGNOSIS — I152 Hypertension secondary to endocrine disorders: Secondary | ICD-10-CM | POA: Diagnosis not present

## 2023-09-29 DIAGNOSIS — E7849 Other hyperlipidemia: Secondary | ICD-10-CM | POA: Diagnosis not present

## 2023-09-29 DIAGNOSIS — N1831 Chronic kidney disease, stage 3a: Secondary | ICD-10-CM | POA: Diagnosis not present

## 2023-09-29 DIAGNOSIS — H903 Sensorineural hearing loss, bilateral: Secondary | ICD-10-CM | POA: Diagnosis not present

## 2023-09-29 DIAGNOSIS — G4733 Obstructive sleep apnea (adult) (pediatric): Secondary | ICD-10-CM | POA: Diagnosis not present

## 2023-09-29 DIAGNOSIS — E1159 Type 2 diabetes mellitus with other circulatory complications: Secondary | ICD-10-CM | POA: Diagnosis not present

## 2023-10-15 DIAGNOSIS — E1159 Type 2 diabetes mellitus with other circulatory complications: Secondary | ICD-10-CM | POA: Diagnosis not present

## 2023-10-15 DIAGNOSIS — E7849 Other hyperlipidemia: Secondary | ICD-10-CM | POA: Diagnosis not present

## 2023-10-15 DIAGNOSIS — E1122 Type 2 diabetes mellitus with diabetic chronic kidney disease: Secondary | ICD-10-CM | POA: Diagnosis not present

## 2023-10-15 DIAGNOSIS — H903 Sensorineural hearing loss, bilateral: Secondary | ICD-10-CM | POA: Diagnosis not present

## 2023-10-15 DIAGNOSIS — Z7901 Long term (current) use of anticoagulants: Secondary | ICD-10-CM | POA: Diagnosis not present

## 2023-10-15 DIAGNOSIS — G4733 Obstructive sleep apnea (adult) (pediatric): Secondary | ICD-10-CM | POA: Diagnosis not present

## 2023-10-15 DIAGNOSIS — N1831 Chronic kidney disease, stage 3a: Secondary | ICD-10-CM | POA: Diagnosis not present

## 2023-10-15 DIAGNOSIS — I152 Hypertension secondary to endocrine disorders: Secondary | ICD-10-CM | POA: Diagnosis not present

## 2023-10-19 ENCOUNTER — Other Ambulatory Visit: Payer: Self-pay | Admitting: Internal Medicine

## 2023-10-25 DIAGNOSIS — G4733 Obstructive sleep apnea (adult) (pediatric): Secondary | ICD-10-CM | POA: Diagnosis not present

## 2023-10-27 NOTE — Telephone Encounter (Signed)
 Prescription was discontinued on 08/26/2023. Is it okay to refuse request?

## 2023-11-28 ENCOUNTER — Ambulatory Visit: Admitting: Internal Medicine

## 2023-12-17 NOTE — Progress Notes (Signed)
 Jodi [name]                                          MRN: [id number]   12/17/2023   The [facility] Quality Team Specialist reviewed this patient medical record for the purposes of chart review for care gap closure. The following were reviewed: abstraction for care gap closure-kidney health evaluation for diabetes:eGFR  and uACR.    [facility] Quality Team

## 2023-12-29 ENCOUNTER — Other Ambulatory Visit: Payer: Self-pay | Admitting: Internal Medicine

## 2024-01-14 NOTE — Progress Notes (Signed)
 "    Jodi [name] T. Jodi Smet, MD, CAQ Sports Medicine [facility] at [facility] [address] [place], [address]  Phone: [phone]  FAX: [phone]  [name] - 79 y.o. female  MRN [id number]  Date of Birth: 1945-04-28  Date: 01/15/2024  PCP: Marylynn Verneita CROME, MD  Referral: Marylynn Verneita CROME, MD  Chief Complaint  Patient presents with   Neck Pain   Subjective:   Jodi [name] is a 79 y.o. very pleasant female patient with Body mass index is 32.79 kg/m. who presents with the following:  Discussed the use of AI scribe software for clinical note transcription with the patient, who gave verbal consent to proceed.  Ms. Jodi [name] is a new patient to me, she is a patient of Dr. Tullo presents as a new sports medicine consultation for neck pain.  History is significant for prior Roux-en-Y as well as prior left-sided total hip arthroplasty and right sided total knee arthroplasty.   History of Present Illness Jodi [name] is a 79 year old female who presents with neck pain and limited mobility.  She has been experiencing severe neck pain for several weeks, described as shooting pain that radiates up her neck and into her eyeballs. No recent injury is reported.  She has tried Tylenol  and heat application at home without relief. She cannot take ibuprofen  or Aleve  due to her history of gastric bypass and current use of Eliquis .  She experiences occasional numbness in her hands, which she states is not new and did not start with the neck pain. No previous neck injuries, surgeries, or fractures are reported.  She has difficulty lifting her arm overhead, a condition persisting for a few years, and describes a sensation of her bones 'rubbing against each other'.  She does not have any focal weakness.  No significant radiculopathy or tingling in the upper extremities.  The patient and her daughter asked me to recheck her kidney function.  Review of Systems is  noted in the HPI, as appropriate  Objective:   BP 128/60   Pulse (!) 104   Temp (!) 97.3 F (36.3 C) (Temporal)   Ht 5' 4 (1.626 m)   Wt 191 lb (86.6 kg)   SpO2 96%   BMI 32.79 kg/m   GEN: No acute distress; alert,appropriate. PULM: Breathing comfortably in no respiratory distress PSYCH: Normally interactive.    CERVICAL SPINE EXAM Range of motion: Flexion, extension, lateral bending, and rotation: She has significant loss of motion, grossly 65% in all directions Spurling's: Negative Pain with terminal motion: Yes Spinous Processes: NT SCM: NT Upper paracervical muscles: Notable bilateral posterior Upper traps: Tender to palpation C5-T1 intact, sensation and motor   Left shoulder has active motion to 165 degrees in abduction and flexion. Strength in the left shoulder is 4 -/5 in abduction and flexion External rotation is 4/5 Internal range of motion is 5/5  Laboratory and Imaging Data:  Assessment and Plan:     ICD-10-CM   1. Cervicalgia  M54.2 DG Cervical Spine Complete    dexamethasone  (DECADRON ) injection 10 mg    2. History of Roux-en-Y gastric bypass  Z98.84     3. Stage 3a chronic kidney disease (HCC)  N18.31 Basic metabolic panel with GFR     Assessment & Plan Cervicalgia Chronic neck pain with limited range of motion, radiating to neck and eyes, with occasional hand numbness, which she does not describe is new.  Pain exacerbated by movement. Gastric bypass  limits NSAID use, and Eliquis  contraindicates certain medications. - Ordered cervical spine x-ray. - I am going to give her a shot of Decadron  to bypass her GI tract. - If symptoms persist after a few weeks, recommend formal physical therapy  Stage 3a chronic kidney disease Moderate kidney impairment. Overdue for nephrology follow-up. - Ordered blood test to recheck kidney function. - I will send these results to her primary care doctor  She also asked me to refill her Prilosec  Medication  Management during today's office visit: Meds ordered this encounter  Medications   omeprazole  (PRILOSEC) 20 MG capsule    Sig: Take 1 capsule (20 mg total) by mouth daily.    Dispense:  90 capsule    Refill:  0   dexamethasone  (DECADRON ) injection 10 mg   There are no discontinued medications.  Orders placed today for conditions managed today: Orders Placed This Encounter  Procedures   DG Cervical Spine Complete   Basic metabolic panel with GFR    Disposition: No follow-ups on file.  [employer] One speech-to-text software was used for transcription in this dictation.  Possible transcriptional errors can occur using Animal nutritionist.   Signed,  Jacques DASEN. Maika Kaczmarek, MD   Outpatient Encounter Medications as of 01/15/2024  Medication Sig   acetaminophen  (TYLENOL ) 500 MG tablet Take 1,000 mg by mouth 2 (two) times daily as needed for moderate pain, fever or headache.   amitriptyline  (ELAVIL ) 50 MG tablet TAKE 1 TABLET BY MOUTH EVERYDAY AT BEDTIMETAKE 1 TABLET BY MOUTH EVERYDAY AT BEDTIME   apixaban  (ELIQUIS ) 2.5 MG TABS tablet Take 1 tablet (2.5 mg total) by mouth 2 (two) times daily.   atorvastatin  (LIPITOR) 10 MG tablet Take 1 tablet (10 mg total) by mouth daily.   buPROPion  (WELLBUTRIN  SR) 150 MG 12 hr tablet Take 1 tablet (150 mg total) by mouth 2 (two) times daily.   docusate sodium  (COLACE) 100 MG capsule Take 1 capsule (100 mg total) by mouth 2 (two) times daily.   losartan -hydrochlorothiazide  (HYZAAR) 50-12.5 MG tablet Take 1 tablet by mouth daily.   magnesium  oxide (MAG-OX) 400 (240 Mg) MG tablet Take 1 tablet (400 mg total) by mouth daily.   Multiple Vitamins-Minerals (CENTRUM SILVER 50+WOMEN) TABS Take 1 tablet by mouth daily.   polyethylene glycol (MIRALAX  / GLYCOLAX ) 17 g packet Take 17 g by mouth daily as needed for mild constipation.   sertraline  (ZOLOFT ) 100 MG tablet TAKE 1.5 TABLETS (150 MG TOTAL) BY MOUTH DAILY   traZODone  (DESYREL ) 100 MG tablet Take 1 tablet  (100 mg total) by mouth 2 (two) times daily.   omeprazole  (PRILOSEC) 20 MG capsule Take 1 capsule (20 mg total) by mouth daily.   [EXPIRED] dexamethasone  (DECADRON ) injection 10 mg    No facility-administered encounter medications on file as of 01/15/2024.   "

## 2024-01-15 ENCOUNTER — Ambulatory Visit (INDEPENDENT_AMBULATORY_CARE_PROVIDER_SITE_OTHER): Admitting: Family Medicine

## 2024-01-15 ENCOUNTER — Encounter: Payer: Self-pay | Admitting: Family Medicine

## 2024-01-15 ENCOUNTER — Ambulatory Visit
Admission: RE | Admit: 2024-01-15 | Discharge: 2024-01-15 | Disposition: A | Discharge status: Home / Self Care | Source: Ambulatory Visit | Attending: Family Medicine | Admitting: Family Medicine

## 2024-01-15 VITALS — BP 128/60 | HR 104 | Temp 97.3°F | Ht 64.0 in | Wt 191.0 lb

## 2024-01-15 DIAGNOSIS — M542 Cervicalgia: Secondary | ICD-10-CM | POA: Diagnosis not present

## 2024-01-15 DIAGNOSIS — N1831 Chronic kidney disease, stage 3a: Secondary | ICD-10-CM | POA: Diagnosis not present

## 2024-01-15 DIAGNOSIS — Z9884 Bariatric surgery status: Secondary | ICD-10-CM

## 2024-01-15 MED ORDER — DEXAMETHASONE SODIUM PHOSPHATE 100 MG/10ML IJ SOLN
10.0000 mg | Freq: Once | INTRAMUSCULAR | Status: AC
  Administered 2024-01-15: 10 mg via INTRAMUSCULAR

## 2024-01-15 MED ORDER — OMEPRAZOLE 20 MG PO CPDR: 20.0000 mg | DELAYED_RELEASE_CAPSULE | Freq: Every day | ORAL | 0 refills | Status: DC

## 2024-01-16 ENCOUNTER — Ambulatory Visit: Payer: Self-pay | Admitting: Family Medicine

## 2024-01-16 LAB — BASIC METABOLIC PANEL WITH GFR
BUN: 25 mg/dL — ABNORMAL HIGH (ref 6–23)
CO2: 26 meq/L (ref 19–32)
Calcium: 8.7 mg/dL (ref 8.4–10.5)
Chloride: 105 meq/L (ref 96–112)
Creatinine, Ser: 0.92 mg/dL (ref 0.40–1.20)
GFR: 60.18 mL/min
Glucose, Bld: 105 mg/dL — ABNORMAL HIGH (ref 70–99)
Potassium: 3.7 meq/L (ref 3.5–5.1)
Sodium: 139 meq/L (ref 135–145)

## 2024-02-11 ENCOUNTER — Telehealth: Payer: Self-pay | Admitting: Internal Medicine

## 2024-02-11 ENCOUNTER — Encounter: Payer: Self-pay | Admitting: Internal Medicine

## 2024-02-11 ENCOUNTER — Ambulatory Visit: Admitting: Internal Medicine

## 2024-02-11 VITALS — BP 122/60 | HR 65 | Temp 97.8°F | Ht 65.0 in | Wt 192.8 lb

## 2024-02-11 DIAGNOSIS — G8929 Other chronic pain: Secondary | ICD-10-CM | POA: Insufficient documentation

## 2024-02-11 DIAGNOSIS — R7303 Prediabetes: Secondary | ICD-10-CM

## 2024-02-11 DIAGNOSIS — Z8639 Personal history of other endocrine, nutritional and metabolic disease: Secondary | ICD-10-CM

## 2024-02-11 DIAGNOSIS — N1832 Chronic kidney disease, stage 3b: Secondary | ICD-10-CM

## 2024-02-11 DIAGNOSIS — N1831 Chronic kidney disease, stage 3a: Secondary | ICD-10-CM

## 2024-02-11 DIAGNOSIS — R2681 Unsteadiness on feet: Secondary | ICD-10-CM

## 2024-02-11 LAB — POCT GLYCOSYLATED HEMOGLOBIN (HGB A1C): Hemoglobin A1C: 5.7 % — AB (ref 4.0–5.6)

## 2024-02-11 MED ORDER — TRAMADOL HCL 50 MG PO TABS: 50.0000 mg | ORAL_TABLET | Freq: Every day | ORAL | 2 refills | Status: AC | PRN

## 2024-02-11 MED ORDER — OMEPRAZOLE 20 MG PO CPDR: 20.0000 mg | DELAYED_RELEASE_CAPSULE | Freq: Every day | ORAL | 0 refills | Status: AC

## 2024-02-11 NOTE — Patient Instructions (Signed)
" °  You can take 3000 mg of acetominophen (tylenol ) every day safely  In divided doses (750 mg  every 6 hours  Or 1000 mg every 8 hours.)   If your pain is not controlled with this  regimen,  you can ADD one tramadol  daily   STOP THE ELIQUIS   I AM REFERRING YOU back to Dr Watt to treat the left shoulder pain  "

## 2024-02-11 NOTE — Telephone Encounter (Signed)
 Non-Opioid Controlled Substance Agreement signed and scanned 2.4.26

## 2024-02-11 NOTE — Progress Notes (Signed)
 "  Subjective:  Patient ID: Jodi [name], female    DOB: 15-Sep-1945  Age: 79 y.o. MRN: [id number]  CC: The primary encounter diagnosis was Stage 3a chronic kidney disease (HCC). Diagnoses of Prediabetes, Chronic kidney disease, stage 3b (HCC), Personal history of diabetes mellitus, Chronic left shoulder pain, Gait instability, and Other chronic pain were also pertinent to this visit.   HPI Jodi [name] presents for  Chief Complaint  Patient presents with   Medical Management of Chronic Issues    Follow up neck issues   Type 2 DM  CKD stage 3   Cervicalgia:   was seen by Dr Watt, sports medicine , jan 8 for neck pain without radiation or myelopathy.  Plain films non diagnosiic.  She was  given an IM injection of Depo Medrol  which gave relief of pain for a day.  Symptoms have been present Since Christmas and did not follow a fall or unusual activity.  She reports restricted  [name] with head rotation , less so with extension and flexion  Left shoulder pain :  has been  constant for the past  year. Jodi [name] is limited. She cannot raise arm to the horizontal . She is  right handed.  Denies weakness of hands   Frequent falls:  since moving in with her daughter she has had no falls.  Completed PT with [facility]    Outpatient Medications Prior to Visit  Medication Sig Dispense Refill   acetaminophen  (TYLENOL ) 500 MG tablet Take 1,000 mg by mouth 2 (two) times daily as needed for moderate pain, fever or headache.     amitriptyline  (ELAVIL ) 50 MG tablet TAKE 1 TABLET BY MOUTH EVERYDAY AT BEDTIMETAKE 1 TABLET BY MOUTH EVERYDAY AT BEDTIME 90 tablet 1   atorvastatin  (LIPITOR) 10 MG tablet Take 1 tablet (10 mg total) by mouth daily. 90 tablet 1   buPROPion  (WELLBUTRIN  SR) 150 MG 12 hr tablet Take 1 tablet (150 mg total) by mouth 2 (two) times daily. 180 tablet 1   docusate sodium  (COLACE) 100 MG capsule Take 1 capsule (100 mg total) by mouth 2 (two) times daily.     losartan -hydrochlorothiazide   (HYZAAR) 50-12.5 MG tablet Take 1 tablet by mouth daily. 90 tablet 1   magnesium  oxide (MAG-OX) 400 (240 Mg) MG tablet Take 1 tablet (400 mg total) by mouth daily.     Multiple Vitamins-Minerals (CENTRUM SILVER 50+WOMEN) TABS Take 1 tablet by mouth daily.     polyethylene glycol (MIRALAX  / GLYCOLAX ) 17 g packet Take 17 g by mouth daily as needed for mild constipation.     sertraline  (ZOLOFT ) 100 MG tablet TAKE 1.5 TABLETS (150 MG TOTAL) BY MOUTH DAILY 135 tablet 0   traZODone  (DESYREL ) 100 MG tablet Take 1 tablet (100 mg total) by mouth 2 (two) times daily. 180 tablet 1   apixaban  (ELIQUIS ) 2.5 MG TABS tablet Take 1 tablet (2.5 mg total) by mouth 2 (two) times daily. 180 tablet 1   omeprazole  (PRILOSEC) 20 MG capsule Take 1 capsule (20 mg total) by mouth daily. 90 capsule 0   No facility-administered medications prior to visit.    Review of Systems;  Patient denies headache, fevers, malaise, unintentional weight loss, skin rash, eye pain, sinus congestion and sinus pain, sore throat, dysphagia,  hemoptysis , cough, dyspnea, wheezing, chest pain, palpitations, orthopnea, edema, abdominal pain, nausea, melena, diarrhea, constipation, flank pain, dysuria, hematuria, urinary  Frequency, nocturia, numbness, tingling, seizures,  Focal weakness, Loss of consciousness,  Tremor,  insomnia, depression, anxiety, and suicidal ideation.      Objective:  BP 122/60 (BP Location: Left Arm)   Pulse 65   Temp 97.8 F (36.6 C)   Ht 5' 5 (1.651 m)   Wt 192 lb 12.8 oz (87.5 kg)   SpO2 (!) 88%   BMI 32.08 kg/m   BP Readings from Last 3 Encounters:  02/11/24 122/60  01/15/24 128/60  09/05/23 128/64    Wt Readings from Last 3 Encounters:  02/11/24 192 lb 12.8 oz (87.5 kg)  01/15/24 191 lb (86.6 kg)  09/05/23 198 lb 12.8 oz (90.2 kg)    Physical Exam Vitals reviewed.  Constitutional:      General: She is not in acute distress.    Appearance: Normal appearance. She is normal weight. She is not  ill-appearing, toxic-appearing or diaphoretic.  HENT:     Head: Normocephalic.  Eyes:     General: No scleral icterus.       Right eye: No discharge.        Left eye: No discharge.     Conjunctiva/sclera: Conjunctivae normal.  Cardiovascular:     Rate and Rhythm: Normal rate and regular rhythm.     Heart sounds: Normal heart sounds.  Pulmonary:     Effort: Pulmonary effort is normal. No respiratory distress.     Breath sounds: Normal breath sounds.  Musculoskeletal:     Right shoulder: Decreased range of motion.     Left shoulder: Decreased range of motion.     Comments: [name] restricted to 130 degrees   Skin:    General: Skin is warm and dry.  Neurological:     General: No focal deficit present.     Mental Status: She is alert and oriented to person, place, and time. Mental status is at baseline.  Psychiatric:        Mood and Affect: Mood normal.        Behavior: Behavior normal.        Thought Content: Thought content normal.        Judgment: Judgment normal.     Lab Results  Component Value Date   HGBA1C 5.7 (A) 02/11/2024   HGBA1C 5.7 08/26/2023   HGBA1C 5.3 07/18/2022    Lab Results  Component Value Date   CREATININE 0.92 01/15/2024   CREATININE 1.32 (H) 08/26/2023   CREATININE 1.09 08/21/2022    Lab Results  Component Value Date   WBC 7.3 08/26/2023   HGB 12.7 08/26/2023   HCT 39.1 08/26/2023   PLT 247.0 08/26/2023   GLUCOSE 105 (H) 01/15/2024   CHOL 158 08/26/2023   TRIG 91.0 08/26/2023   HDL 79.40 08/26/2023   LDLDIRECT 62.0 08/26/2023   LDLCALC 60 08/26/2023   ALT 14 08/26/2023   AST 18 08/26/2023   NA 139 01/15/2024   K 3.7 01/15/2024   CL 105 01/15/2024   CREATININE 0.92 01/15/2024   BUN 25 (H) 01/15/2024   CO2 26 01/15/2024   TSH 4.34 08/26/2023   INR 1.1 07/18/2022   HGBA1C 5.7 (A) 02/11/2024   MICROALBUR <0.7 08/26/2023    DG Cervical Spine Complete Result Date: 01/22/2024 EXAM: 6 VIEW(S) XRAY OF THE CERVICAL SPINE 01/15/2024  04:46:16 PM COMPARISON: 12/15/2021. CLINICAL HISTORY: neck pain and decreased range of motion. FINDINGS: BONES: 7 cervical segments are well visualized. Vertebral body height is well maintained. Alignment is normal. The odontoid is within normal limits. No acute fracture is seen. DISCS AND DEGENERATIVE CHANGES: No severe degenerative changes. No  neural foraminal narrowing is seen. Facet hypertrophic changes are noted. SOFT TISSUES: No prevertebral soft tissue swelling. IMPRESSION: 1. Degenerative changes without acute abnormality. Electronically signed by: Oneil Devonshire MD 01/22/2024 09:48 PM EST RP Workstation: [id number]    Assessment & Plan:   Problem List Items Addressed This Visit     Chronic kidney disease, stage 3b (HCC) - Primary   Managed by Mumsoor Lateef ,  [facility] .  GFR is stable and lytes are normal..  She is avoiding NSAIDS. Continue ARB    Lab Results  Component Value Date   CREATININE 0.92 01/15/2024   Lab Results  Component Value Date   NA 139 01/15/2024   K 3.7 01/15/2024   CL 105 01/15/2024   CO2 26 01/15/2024   Lab Results  Component Value Date   MICROALBUR <0.7 08/26/2023         Chronic left shoulder pain   Secondary to advance glenohumeral arthritis and large osteophyte formation at the inferior humeral head, by plain films done in 2022. Last evaluation by [facility] in jan 2023.    Refer to Stuart Coplan for joint injection       Relevant Medications   traMADol  (ULTRAM ) 50 MG tablet   Other Relevant Orders   Ambulatory referral to Sports Medicine   Chronic pain   Secondary to arthritic joints and prior traumatic fractures.  Options for management are limited to tylenol  and opioid analogues due to history of gastric bypass and CKD stage 3.  Counselled on appropriate use of tylenol  and prn use of tramadol  limited to once daily .  Controlled substance contract signed today       Relevant Medications   traMADol  (ULTRAM ) 50 MG tablet    Gait instability   Resulting in multiple falls and fractures, none in over a year.  She has completed  home health PT and is now living with her daughter.       Personal history of diabetes mellitus   A1c has risen ito prediabetes  level with regain of weight and return of obesity.    Lab Results  Component Value Date   HGBA1C 5.7 08/26/2023         Other Visit Diagnoses       Prediabetes       Relevant Orders   POCT HgB A1C (Completed)          I spent 34 minutes on the day of this face to face encounter reviewing patient's  most recent visit with cardiology,  nephrology,  and neurology,  prior relevant surgical and non surgical procedures, recent  labs and imaging studies, counseling on weight management,  reviewing the assessment and plan with patient, and post visit ordering and reviewing of  diagnostics and therapeutics with patient  .   Follow-up: Return in about 3 months (around 05/10/2024).   Jodi LITTIE Kettering, MD "

## 2024-02-11 NOTE — Assessment & Plan Note (Signed)
 A1c has risen ito prediabetes  level with regain of weight and return of obesity.    Lab Results  Component Value Date   HGBA1C 5.7 08/26/2023

## 2024-02-11 NOTE — Assessment & Plan Note (Signed)
 Resulting in multiple falls and fractures, none in over a year.  She has completed  home health PT and is now living with her daughter.

## 2024-02-11 NOTE — Assessment & Plan Note (Addendum)
 Secondary to arthritic joints and prior traumatic fractures.  Options for management are limited to tylenol  and opioid analogues due to history of gastric bypass and CKD stage 3.  Counselled on appropriate use of tylenol  and prn use of tramadol  limited to once daily .  Controlled substance contract signed today

## 2024-02-11 NOTE — Assessment & Plan Note (Addendum)
 Secondary to advance glenohumeral arthritis and large osteophyte formation at the inferior humeral head, by plain films done in 2022. Last evaluation by [facility] in jan 2023.    Refer to Stuart Coplan for joint injection

## 2024-02-11 NOTE — Telephone Encounter (Signed)
 Patient was referred for DEXA In August at her last visit, and has not had it yet. Please call and remind her that if she has osteoporosis we can treat this.    Your bone density test  been ordered.  Please call to make your appointment at [facility]  [phone]

## 2024-02-11 NOTE — Assessment & Plan Note (Addendum)
 Managed by Mumsoor Lateef ,  [facility] .  GFR is stable and lytes are normal..  She is avoiding NSAIDS. Continue ARB    Lab Results  Component Value Date   CREATININE 0.92 01/15/2024   Lab Results  Component Value Date   NA 139 01/15/2024   K 3.7 01/15/2024   CL 105 01/15/2024   CO2 26 01/15/2024   Lab Results  Component Value Date   MICROALBUR <0.7 08/26/2023

## 2024-02-12 NOTE — Telephone Encounter (Signed)
 noted

## 2024-05-11 ENCOUNTER — Ambulatory Visit: Admitting: Internal Medicine
